# Patient Record
Sex: Female | Born: 1965
Health system: Southern US, Community
[De-identification: ages and names within clinical notes are randomized; demographics above are authoritative.]

## PROBLEM LIST (undated history)

## (undated) DIAGNOSIS — K5190 Ulcerative colitis, unspecified, without complications: Secondary | ICD-10-CM

## (undated) DIAGNOSIS — T7840XA Allergy, unspecified, initial encounter: Secondary | ICD-10-CM

## (undated) DIAGNOSIS — Z803 Family history of malignant neoplasm of breast: Secondary | ICD-10-CM

## (undated) DIAGNOSIS — Z1371 Encounter for nonprocreative screening for genetic disease carrier status: Secondary | ICD-10-CM

## (undated) DIAGNOSIS — F419 Anxiety disorder, unspecified: Secondary | ICD-10-CM

## (undated) HISTORY — DX: Family history of malignant neoplasm of breast: Z80.3

## (undated) HISTORY — DX: Allergy, unspecified, initial encounter: T78.40XA

## (undated) HISTORY — DX: Encounter for nonprocreative screening for genetic disease carrier status: Z13.71

## (undated) HISTORY — DX: Anxiety disorder, unspecified: F41.9

## (undated) HISTORY — PX: TUBAL LIGATION: SHX77

---

## 2004-12-11 ENCOUNTER — Inpatient Hospital Stay: Payer: Self-pay | Admitting: Gastroenterology

## 2005-03-07 ENCOUNTER — Ambulatory Visit: Payer: Self-pay | Admitting: Gastroenterology

## 2008-04-26 ENCOUNTER — Ambulatory Visit: Payer: Self-pay | Admitting: Family

## 2009-10-31 DIAGNOSIS — C4492 Squamous cell carcinoma of skin, unspecified: Secondary | ICD-10-CM

## 2009-10-31 HISTORY — DX: Squamous cell carcinoma of skin, unspecified: C44.92

## 2010-01-17 ENCOUNTER — Ambulatory Visit: Payer: Self-pay | Admitting: Unknown Physician Specialty

## 2010-11-28 ENCOUNTER — Other Ambulatory Visit: Payer: Self-pay

## 2011-10-29 DIAGNOSIS — Z1371 Encounter for nonprocreative screening for genetic disease carrier status: Secondary | ICD-10-CM

## 2011-10-29 HISTORY — DX: Encounter for nonprocreative screening for genetic disease carrier status: Z13.71

## 2011-12-21 ENCOUNTER — Other Ambulatory Visit: Payer: Self-pay | Admitting: Unknown Physician Specialty

## 2011-12-21 LAB — CLOSTRIDIUM DIFFICILE BY PCR

## 2012-03-20 ENCOUNTER — Other Ambulatory Visit: Payer: Self-pay | Admitting: Unknown Physician Specialty

## 2012-03-22 LAB — STOOL CULTURE

## 2013-09-16 ENCOUNTER — Other Ambulatory Visit: Payer: Self-pay

## 2013-10-13 ENCOUNTER — Ambulatory Visit: Payer: Self-pay | Admitting: Urology

## 2015-03-08 ENCOUNTER — Emergency Department (HOSPITAL_COMMUNITY)
Admission: EM | Admit: 2015-03-08 | Discharge: 2015-03-08 | Disposition: A | Discharge status: Home / Self Care | Payer: BLUE CROSS/BLUE SHIELD | Attending: Emergency Medicine | Admitting: Emergency Medicine

## 2015-03-08 ENCOUNTER — Encounter (HOSPITAL_COMMUNITY): Payer: Self-pay | Admitting: Family Medicine

## 2015-03-08 DIAGNOSIS — Y9389 Activity, other specified: Secondary | ICD-10-CM | POA: Insufficient documentation

## 2015-03-08 DIAGNOSIS — Z79899 Other long term (current) drug therapy: Secondary | ICD-10-CM | POA: Diagnosis not present

## 2015-03-08 DIAGNOSIS — F0781 Postconcussional syndrome: Secondary | ICD-10-CM

## 2015-03-08 DIAGNOSIS — S060X0A Concussion without loss of consciousness, initial encounter: Secondary | ICD-10-CM | POA: Diagnosis not present

## 2015-03-08 DIAGNOSIS — Y9289 Other specified places as the place of occurrence of the external cause: Secondary | ICD-10-CM | POA: Diagnosis not present

## 2015-03-08 DIAGNOSIS — Y998 Other external cause status: Secondary | ICD-10-CM | POA: Diagnosis not present

## 2015-03-08 DIAGNOSIS — W01198A Fall on same level from slipping, tripping and stumbling with subsequent striking against other object, initial encounter: Secondary | ICD-10-CM | POA: Diagnosis not present

## 2015-03-08 DIAGNOSIS — S0990XA Unspecified injury of head, initial encounter: Secondary | ICD-10-CM | POA: Diagnosis present

## 2015-03-08 DIAGNOSIS — Z8719 Personal history of other diseases of the digestive system: Secondary | ICD-10-CM | POA: Insufficient documentation

## 2015-03-08 HISTORY — DX: Ulcerative colitis, unspecified, without complications: K51.90

## 2015-03-08 LAB — BASIC METABOLIC PANEL
Anion gap: 8 (ref 5–15)
BUN: 6 mg/dL (ref 6–20)
CHLORIDE: 109 mmol/L (ref 101–111)
CO2: 25 mmol/L (ref 22–32)
Calcium: 9.2 mg/dL (ref 8.9–10.3)
Creatinine, Ser: 0.68 mg/dL (ref 0.44–1.00)
GFR calc Af Amer: >60 mL/min (ref >60)
GFR calc non Af Amer: >60 mL/min (ref >60)
Glucose, Bld: 105 mg/dL — ABNORMAL HIGH (ref 70–99)
Potassium: 4.7 mmol/L (ref 3.5–5.1)
Sodium: 142 mmol/L (ref 135–145)

## 2015-03-08 LAB — URINE MICROSCOPIC-ADD ON

## 2015-03-08 LAB — URINALYSIS, ROUTINE W REFLEX MICROSCOPIC
Bilirubin Urine: NEGATIVE
GLUCOSE, UA: NEGATIVE mg/dL
Ketones, ur: 15 mg/dL — AB
Leukocytes, UA: NEGATIVE
Nitrite: NEGATIVE
PH: 5 (ref 5.0–8.0)
Protein, ur: NEGATIVE mg/dL
SPECIFIC GRAVITY, URINE: 1.016 (ref 1.005–1.030)
Urobilinogen, UA: 0.2 mg/dL (ref 0.0–1.0)

## 2015-03-08 MED ORDER — OXYCODONE HCL 5 MG/5ML PO SOLN: 5.0000 mg | Freq: Once | ORAL | Status: DC

## 2015-03-08 MED ORDER — OXYCODONE HCL 5 MG/5ML PO SOLN
5.0000 mg | Freq: Once | ORAL | Status: AC
  Administered 2015-03-08: 5 mg via ORAL
  Filled 2015-03-08: qty 5

## 2015-03-08 MED ORDER — SODIUM CHLORIDE 0.9 % IV BOLUS (SEPSIS): 1000.0000 mL | Freq: Once | INTRAVENOUS | Status: DC

## 2015-03-08 MED ORDER — ONDANSETRON 4 MG PO TBDP
4.0000 mg | ORAL_TABLET | Freq: Once | ORAL | Status: AC
  Administered 2015-03-08: 4 mg via ORAL
  Filled 2015-03-08: qty 1

## 2015-03-08 MED ORDER — NAPROXEN 375 MG PO TABS: 375.0000 mg | ORAL_TABLET | Freq: Two times a day (BID) | ORAL | Status: DC | PRN

## 2015-03-08 MED ORDER — ONDANSETRON HCL 4 MG PO TABS: 4.0000 mg | ORAL_TABLET | Freq: Three times a day (TID) | ORAL | Status: DC | PRN

## 2015-03-08 NOTE — ED Notes (Signed)
Pt here for fall. 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 sts was seen at hospital yesterday and told she had a concussion. sts neck pain, head hurting and nausea. sts CT scan of head and chest x ray that were normal. [facility].

## 2015-03-08 NOTE — ED Provider Notes (Signed)
  I saw and evaluated the patient, reviewed the resident's note and I agree with the findings and plan.  Pertinent History: concussion yesterday after syncopal event - had prodromal light headedness and tingling int he fingers - X 2 in the gas station parking lot, seen and evlauated with labs and CT at [facility] - reviewed studies - mild hypoK and, protein / ketones in the urine.  Has had ongoing headache, light headedness and has some difficulty swallowing occasionally. Pertinent Exam findings: Has no acute findings.  Neurologic exam:  Speech clear, pupils equal round reactive to light, extraocular movements intact Normal peripheral visual fields Cranial nerves III through XII normal including no facial droop Follows commands, moves all extremities x4, normal strength to bilateral upper and lower extremities at all major muscle groups including grip Sensation normal to light touch and pinprick Coordination intact, no limb ataxia, finger-nose-finger normal Rapid alternating movements normal No pronator drift Gait normal Cardiac exam normal, no arrhythmia, ECG normal, abd soft and no edema.     EKG Interpretation  Date/Time:  Wednesday Mar 08 2015 15:19:29 EDT Ventricular Rate:  61 PR Interval:  113 QRS Duration: 68 QT Interval:  399 QTC Calculation: 402 R Axis:   58 Text Interpretation:  Sinus rhythm Borderline short PR interval ECG OTHERWISE WITHIN NORMAL LIMITS No old tracing to compare Confirmed by Joakim Huesman  MD, Reya Aurich ([id number]) on 03/08/2015 3:48:41 PM      Repeat BMP and UA - hydrate, well appearing otherwise - her OP is clear, she is tolerating secretions and is not aspirating.  Doubt neurological defecits effecting her ability to swallow.  I personally interpreted the EKG as well as the resident and agree with the interpretation on the resident's chart.  Final diagnoses:  Concussion, without loss of consciousness, initial encounter  Post concussion syndrome      Noemi Chapel, MD 03/09/15 2159

## 2015-03-08 NOTE — Discharge Instructions (Signed)
Concussion  A concussion, or closed-head injury, is a brain injury caused by a direct blow to the head or by a quick and sudden movement (jolt) of the head or neck. Concussions are usually not life-threatening. Even so, the effects of a concussion can be serious. If you have had a concussion before, you are more likely to experience concussion-like symptoms after a direct blow to the head.   CAUSES  · Direct blow to the head, such as from running into another player during a soccer game, being hit in a fight, or hitting your head on a hard surface.  · A jolt of the head or neck that causes the brain to move back and forth inside the skull, such as in a car crash.  SIGNS AND SYMPTOMS  The signs of a concussion can be hard to notice. Early on, they may be missed by you, family members, and health care providers. You may look fine but act or feel differently.  Symptoms are usually temporary, but they may last for days, weeks, or even longer. Some symptoms may appear right away while others may not show up for hours or days. Every head injury is different. Symptoms include:  · Mild to moderate headaches that will not go away.  · A feeling of pressure inside your head.  · Having more trouble than usual:  ¨ Learning or remembering things you have heard.  ¨ Answering questions.  ¨ Paying attention or concentrating.  ¨ Organizing daily tasks.  ¨ Making decisions and solving problems.  · Slowness in thinking, acting or reacting, speaking, or reading.  · Getting lost or being easily confused.  · Feeling tired all the time or lacking energy (fatigued).  · Feeling drowsy.  · Sleep disturbances.  ¨ Sleeping more than usual.  ¨ Sleeping less than usual.  ¨ Trouble falling asleep.  ¨ Trouble sleeping (insomnia).  · Loss of balance or feeling lightheaded or dizzy.  · Nausea or vomiting.  · Numbness or tingling.  · Increased sensitivity to:  ¨ Sounds.  ¨ Lights.  ¨ Distractions.  · Vision problems or eyes that tire  easily.  · Diminished sense of taste or smell.  · Ringing in the ears.  · Mood changes such as feeling sad or anxious.  · Becoming easily irritated or angry for little or no reason.  · Lack of motivation.  · Seeing or hearing things other people do not see or hear (hallucinations).  DIAGNOSIS  Your health care provider can usually diagnose a concussion based on a description of your injury and symptoms. He or she will ask whether you passed out (lost consciousness) and whether you are having trouble remembering events that happened right before and during your injury.  Your evaluation might include:  · A brain scan to look for signs of injury to the brain. Even if the test shows no injury, you may still have a concussion.  · Blood tests to be sure other problems are not present.  TREATMENT  · Concussions are usually treated in an emergency department, in urgent care, or at a clinic. You may need to stay in the hospital overnight for further treatment.  · Tell your health care provider if you are taking any medicines, including prescription medicines, over-the-counter medicines, and natural remedies. Some medicines, such as blood thinners (anticoagulants) and aspirin, may increase the chance of complications. Also tell your health care provider whether you have had alcohol or are taking illegal drugs. This information   may affect treatment.  · Your health care provider will send you home with important instructions to follow.  · How fast you will recover from a concussion depends on many factors. These factors include how severe your concussion is, what part of your brain was injured, your age, and how healthy you were before the concussion.  · Most people with mild injuries recover fully. Recovery can take time. In general, recovery is slower in older persons. Also, persons who have had a concussion in the past or have other medical problems may find that it takes longer to recover from their current injury.  HOME  CARE INSTRUCTIONS  General Instructions  · Carefully follow the directions your health care provider gave you.  · Only take over-the-counter or prescription medicines for pain, discomfort, or fever as directed by your health care provider.  · Take only those medicines that your health care provider has approved.  · Do not drink alcohol until your health care provider says you are well enough to do so. Alcohol and certain other drugs may slow your recovery and can put you at risk of further injury.  · If it is harder than usual to remember things, write them down.  · If you are easily distracted, try to do one thing at a time. For example, do not try to watch TV while fixing dinner.  · Talk with family members or close friends when making important decisions.  · Keep all follow-up appointments. Repeated evaluation of your symptoms is recommended for your recovery.  · Watch your symptoms and tell others to do the same. Complications sometimes occur after a concussion. Older adults with a brain injury may have a higher risk of serious complications, such as a blood clot on the brain.  · Tell your teachers, school nurse, school counselor, coach, athletic trainer, or work manager about your injury, symptoms, and restrictions. Tell them about what you can or cannot do. They should watch for:  ¨ Increased problems with attention or concentration.  ¨ Increased difficulty remembering or learning new information.  ¨ Increased time needed to complete tasks or assignments.  ¨ Increased irritability or decreased ability to cope with stress.  ¨ Increased symptoms.  · Rest. Rest helps the brain to heal. Make sure you:  ¨ Get plenty of sleep at night. Avoid staying up late at night.  ¨ Keep the same bedtime hours on weekends and weekdays.  ¨ Rest during the day. Take daytime naps or rest breaks when you feel tired.  · Limit activities that require a lot of thought or concentration. These include:  ¨ Doing homework or job-related  work.  ¨ Watching TV.  ¨ Working on the computer.  · Avoid any situation where there is potential for another head injury (football, hockey, soccer, basketball, martial arts, downhill snow sports and horseback riding). Your condition will get worse every time you experience a concussion. You should avoid these activities until you are evaluated by the appropriate follow-up health care providers.  Returning To Your Regular Activities  You will need to return to your normal activities slowly, not all at once. You must give your body and brain enough time for recovery.  · Do not return to sports or other athletic activities until your health care provider tells you it is safe to do so.  · Ask your health care provider when you can drive, ride a bicycle, or operate heavy machinery. Your ability to react may be slower after a   brain injury. Never do these activities if you are dizzy.  · Ask your health care provider about when you can return to work or school.  Preventing Another Concussion  It is very important to avoid another brain injury, especially before you have recovered. In rare cases, another injury can lead to permanent brain damage, brain swelling, or death. The risk of this is greatest during the first 7-10 days after a head injury. Avoid injuries by:  · Wearing a seat belt when riding in a car.  · Drinking alcohol only in moderation.  · Wearing a helmet when biking, skiing, skateboarding, skating, or doing similar activities.  · Avoiding activities that could lead to a second concussion, such as contact or recreational sports, until your health care provider says it is okay.  · Taking safety measures in your home.  ¨ Remove clutter and tripping hazards from floors and stairways.  ¨ Use grab bars in bathrooms and handrails by stairs.  ¨ Place non-slip mats on floors and in bathtubs.  ¨ Improve lighting in dim areas.  SEEK MEDICAL CARE IF:  · You have increased problems paying attention or  concentrating.  · You have increased difficulty remembering or learning new information.  · You need more time to complete tasks or assignments than before.  · You have increased irritability or decreased ability to cope with stress.  · You have more symptoms than before.  Seek medical care if you have any of the following symptoms for more than 2 weeks after your injury:  · Lasting (chronic) headaches.  · Dizziness or balance problems.  · Nausea.  · Vision problems.  · Increased sensitivity to noise or light.  · Depression or mood swings.  · Anxiety or irritability.  · Memory problems.  · Difficulty concentrating or paying attention.  · Sleep problems.  · Feeling tired all the time.  SEEK IMMEDIATE MEDICAL CARE IF:  · You have severe or worsening headaches. These may be a sign of a blood clot in the brain.  · You have weakness (even if only in one hand, leg, or part of the face).  · You have numbness.  · You have decreased coordination.  · You vomit repeatedly.  · You have increased sleepiness.  · One pupil is larger than the other.  · You have convulsions.  · You have slurred speech.  · You have increased confusion. This may be a sign of a blood clot in the brain.  · You have increased restlessness, agitation, or irritability.  · You are unable to recognize people or places.  · You have neck pain.  · It is difficult to wake you up.  · You have unusual behavior changes.  · You lose consciousness.  MAKE SURE YOU:  · Understand these instructions.  · Will watch your condition.  · Will get help right away if you are not doing well or get worse.  Document Released: 01/04/2004 Document Revised: 10/19/2013 Document Reviewed: 05/06/2013  Exit[employer]® Patient Information ©2015 [employer]. This information is not intended to replace advice given to you by your health care provider. Make sure you discuss any questions you have with your health care provider.

## 2015-03-08 NOTE — ED Provider Notes (Signed)
CSN: [id number]     Arrival date & time 03/08/15  1247 History   First MD Initiated Contact with Patient 03/08/15 1517     Chief Complaint  Patient presents with  . Fall     (Consider location/radiation/quality/duration/timing/severity/associated sxs/prior Treatment) HPI Comments: 49 year old female status Jessey Huyett fall from standing yesterday. Patient reports getting out of her car feeling lightheaded with tingling in her fingers. She subsequently had a syncopal episode striking the back of her head on the concrete. She was taken outside hospital had full workup including blood work head CT etc. which was normal. She is discharged home. Today she continues to have headaches some nausea but no emesis. However she is not told she had a concussion. When she continued to have the symptoms today she returned the hospital for further evaluation. Also describing some painful swelling today however that is waxing and waning. She is tolerating liquid and solid. Unclear if this is related fall yesterday.  Patient is a 49 y.o. female presenting with fall. The history is provided by the patient.  Fall This is a new problem. The problem occurs rarely. The problem has been resolved. Associated symptoms include headaches. Pertinent negatives include no abdominal pain, chest pain, congestion, fever or rash. Nothing aggravates the symptoms.    Past Medical History  Diagnosis Date  . UC (ulcerative colitis)    Past Surgical History  Procedure Laterality Date  . Cesarean section     History reviewed. No pertinent family history. History  Substance Use Topics  . Smoking status: Never Smoker   . Smokeless tobacco: Not on file  . Alcohol Use: No   OB History    No data available     Review of Systems  Constitutional: Negative for fever.  HENT: Negative for congestion.   Respiratory: Negative for shortness of breath.   Cardiovascular: Negative for chest pain.  Gastrointestinal: Negative for abdominal  pain.  Genitourinary: Negative for dysuria.  Skin: Negative for rash.  Neurological: Positive for headaches. Negative for syncope and light-headedness.  Psychiatric/Behavioral: Negative for confusion.  All other systems reviewed and are negative.     Allergies  Review of patient's allergies indicates no known allergies.  Home Medications   Prior to Admission medications   Medication Sig Start Date End Date Taking? Authorizing Provider  mercaptopurine (PURINETHOL) 50 MG tablet Take 50 mg by mouth daily. 01/10/15  Yes Historical Provider, MD  sertraline (ZOLOFT) 50 MG tablet Take 75 mg by mouth daily as needed. 07/12/14  Yes Historical Provider, MD  naproxen (NAPROSYN) 375 MG tablet Take 1 tablet (375 mg total) by mouth 2 (two) times daily as needed for moderate pain. 03/08/15   Robynn Pane, MD  oxyCODONE (ROXICODONE) 5 MG/5ML solution Take 5 mLs (5 mg total) by mouth once. 03/08/15   Robynn Pane, MD   BP 123/74 mmHg  Pulse 65  Temp(Src) 98.6 F (37 C)  Resp 22  Ht 5' 4"  (1.626 m)  Wt 120 lb (54.432 kg)  BMI 20.59 kg/m2  SpO2 100%  LMP 03/03/2015 Physical Exam  Constitutional: She is oriented to person, place, and time. She appears well-developed.  HENT:  Head: Normocephalic.  No large abrasions hematomas etc. No step-offs.  Eyes: Pupils are equal, round, and reactive to light.  Neck: Normal range of motion.  Cardiovascular: Normal rate.   No murmur heard. Pulmonary/Chest: No respiratory distress.  Abdominal: Soft. She exhibits no distension. There is no tenderness.  Musculoskeletal: Normal range of motion. She exhibits no  tenderness.  Neurological: She is alert and oriented to person, place, and time. No cranial nerve deficit. She exhibits normal muscle tone. Coordination normal.  Skin: Skin is warm. No rash noted.  Psychiatric: She has a normal mood and affect.  Vitals reviewed.   ED Course  Procedures (including critical care time) Labs Review Labs Reviewed  BASIC  METABOLIC PANEL - Abnormal; Notable for the following:    Glucose, Bld 105 (*)    All other components within normal limits  URINALYSIS, ROUTINE W REFLEX MICROSCOPIC - Abnormal; Notable for the following:    APPearance CLOUDY (*)    Hgb urine dipstick MODERATE (*)    Ketones, ur 15 (*)    All other components within normal limits  URINE MICROSCOPIC-ADD ON - Abnormal; Notable for the following:    Squamous Epithelial / LPF FEW (*)    Bacteria, UA FEW (*)    All other components within normal limits    Imaging Review No results found.   EKG Interpretation   Date/Time:  Wednesday Mar 08 2015 15:19:29 EDT Ventricular Rate:  61 PR Interval:  113 QRS Duration: 68 QT Interval:  399 QTC Calculation: 402 R Axis:   58 Text Interpretation:  Sinus rhythm Borderline short PR interval ECG  OTHERWISE WITHIN NORMAL LIMITS No old tracing to compare Confirmed by  MILLER  MD, [place] ([id number]) on 03/08/2015 3:48:41 PM      MDM  49 year old female fall 24 hours ago. Was evaluated at outside hospital with labs, CT which were normal. Patient arrives today with complaints of slight nausea and headache. Signs and symptoms c/w postconcussive syndrome. On physical exam patient has a completely normal neurological exam without focal deficits. CT read reviewed from outside hospital-normal. Labs from yesterday were also obtained and reviewed including cbc, bmp, ua, uds etc (workup for syncope). Based on patient's story and labs it appears most likely vasovagal in nature. No organic cause for syncope on OSH labs. On BMP yesterday she was noted to have a slightly elevated anion gap with UA which showed some mild ketones, therefore these labs were repeated today. Patient anion gap now normal. UA unremarkable. Patient is likely with postconcussive syndrome. We will recommend ibuprofen and naproxen. Also per 5 small prescription for Roxicodone. We counseled patient on expected duration and type of symptoms with  postconcussive ache. Discussed precautions. Believe appropriate for outpatient management. Patient is also complaining of some painful swallowing today. Patient has no stridor no swelling or deformities/abnormalities to the neck. Oropharynx is clear without swelling. Voice normal. Tolerating by mouth easily in the ED. Recommend follow-up with PCP for this if this continues to be an issue.  Final diagnoses:  Concussion, without loss of consciousness, initial encounter  Mcdaniel Ohms concussion syndrome        Robynn Pane, MD 03/08/15 2210  Noemi Chapel, MD 03/09/15 2159

## 2015-03-08 NOTE — ED Notes (Signed)
Gave pt water

## 2016-03-13 DIAGNOSIS — R41840 Attention and concentration deficit: Secondary | ICD-10-CM | POA: Diagnosis not present

## 2016-03-13 DIAGNOSIS — G44229 Chronic tension-type headache, not intractable: Secondary | ICD-10-CM | POA: Diagnosis not present

## 2016-07-02 DIAGNOSIS — L720 Epidermal cyst: Secondary | ICD-10-CM | POA: Diagnosis not present

## 2016-07-02 DIAGNOSIS — L820 Inflamed seborrheic keratosis: Secondary | ICD-10-CM | POA: Diagnosis not present

## 2016-07-17 ENCOUNTER — Other Ambulatory Visit: Payer: Self-pay | Admitting: Obstetrics and Gynecology

## 2016-07-17 DIAGNOSIS — Z1231 Encounter for screening mammogram for malignant neoplasm of breast: Secondary | ICD-10-CM

## 2016-08-07 DIAGNOSIS — Z315 Encounter for genetic counseling: Secondary | ICD-10-CM | POA: Diagnosis not present

## 2016-08-07 DIAGNOSIS — Z1239 Encounter for other screening for malignant neoplasm of breast: Secondary | ICD-10-CM | POA: Diagnosis not present

## 2016-08-07 DIAGNOSIS — Z803 Family history of malignant neoplasm of breast: Secondary | ICD-10-CM | POA: Diagnosis not present

## 2016-08-07 DIAGNOSIS — Z01419 Encounter for gynecological examination (general) (routine) without abnormal findings: Secondary | ICD-10-CM | POA: Diagnosis not present

## 2016-08-07 DIAGNOSIS — Z124 Encounter for screening for malignant neoplasm of cervix: Secondary | ICD-10-CM | POA: Diagnosis not present

## 2016-08-12 ENCOUNTER — Ambulatory Visit
Admission: RE | Admit: 2016-08-12 | Discharge: 2016-08-12 | Disposition: A | Discharge status: Home / Self Care | Payer: BLUE CROSS/BLUE SHIELD | Source: Ambulatory Visit | Attending: Obstetrics and Gynecology | Admitting: Obstetrics and Gynecology

## 2016-08-12 DIAGNOSIS — Z315 Encounter for genetic counseling: Secondary | ICD-10-CM | POA: Diagnosis not present

## 2016-08-12 DIAGNOSIS — Z1239 Encounter for other screening for malignant neoplasm of breast: Secondary | ICD-10-CM | POA: Diagnosis not present

## 2016-08-12 DIAGNOSIS — Z01419 Encounter for gynecological examination (general) (routine) without abnormal findings: Secondary | ICD-10-CM | POA: Diagnosis not present

## 2016-08-12 DIAGNOSIS — Z1231 Encounter for screening mammogram for malignant neoplasm of breast: Secondary | ICD-10-CM | POA: Insufficient documentation

## 2016-08-12 DIAGNOSIS — Z124 Encounter for screening for malignant neoplasm of cervix: Secondary | ICD-10-CM | POA: Diagnosis not present

## 2016-08-14 ENCOUNTER — Other Ambulatory Visit: Payer: Self-pay | Admitting: *Deleted

## 2016-08-14 ENCOUNTER — Ambulatory Visit
Admission: RE | Admit: 2016-08-14 | Discharge: 2016-08-14 | Disposition: A | Discharge status: Home / Self Care | Payer: Self-pay | Source: Ambulatory Visit | Attending: *Deleted | Admitting: *Deleted

## 2016-08-14 DIAGNOSIS — G44229 Chronic tension-type headache, not intractable: Secondary | ICD-10-CM | POA: Diagnosis not present

## 2016-08-14 DIAGNOSIS — F331 Major depressive disorder, recurrent, moderate: Secondary | ICD-10-CM | POA: Diagnosis not present

## 2016-08-14 DIAGNOSIS — Z9289 Personal history of other medical treatment: Secondary | ICD-10-CM

## 2016-08-14 DIAGNOSIS — R41840 Attention and concentration deficit: Secondary | ICD-10-CM | POA: Diagnosis not present

## 2016-08-14 LAB — HM PAP SMEAR: HM Pap smear: NEGATIVE

## 2017-06-10 ENCOUNTER — Telehealth: Payer: Self-pay

## 2017-06-10 NOTE — Telephone Encounter (Signed)
Pt needs appt with [place] or PCP for eval/mgmt. Not standard sx of menopause. RN to notify pt.

## 2017-06-10 NOTE — Telephone Encounter (Signed)
Pt is going thru menopause, wants rx for anxiety.  [facility] [address].  [phone]

## 2017-06-11 NOTE — Telephone Encounter (Signed)
Left detailed msg.

## 2017-09-03 ENCOUNTER — Other Ambulatory Visit: Payer: Self-pay | Admitting: Obstetrics and Gynecology

## 2017-09-03 DIAGNOSIS — Z1231 Encounter for screening mammogram for malignant neoplasm of breast: Secondary | ICD-10-CM

## 2017-09-23 ENCOUNTER — Encounter: Payer: Self-pay | Admitting: Obstetrics and Gynecology

## 2017-09-23 ENCOUNTER — Ambulatory Visit
Admission: RE | Admit: 2017-09-23 | Discharge: 2017-09-23 | Disposition: A | Discharge status: Home / Self Care | Payer: BLUE CROSS/BLUE SHIELD | Source: Ambulatory Visit | Attending: Obstetrics and Gynecology | Admitting: Obstetrics and Gynecology

## 2017-09-23 DIAGNOSIS — Z1231 Encounter for screening mammogram for malignant neoplasm of breast: Secondary | ICD-10-CM | POA: Diagnosis not present

## 2017-09-30 ENCOUNTER — Ambulatory Visit (INDEPENDENT_AMBULATORY_CARE_PROVIDER_SITE_OTHER): Payer: BLUE CROSS/BLUE SHIELD | Admitting: Obstetrics and Gynecology

## 2017-09-30 ENCOUNTER — Encounter: Payer: Self-pay | Admitting: Obstetrics and Gynecology

## 2017-09-30 VITALS — BP 120/70 | HR 66 | Ht 64.0 in | Wt 124.0 lb

## 2017-09-30 DIAGNOSIS — Z01419 Encounter for gynecological examination (general) (routine) without abnormal findings: Secondary | ICD-10-CM

## 2017-09-30 DIAGNOSIS — Z13220 Encounter for screening for lipoid disorders: Secondary | ICD-10-CM | POA: Diagnosis not present

## 2017-09-30 DIAGNOSIS — Z0000 Encounter for general adult medical examination without abnormal findings: Secondary | ICD-10-CM

## 2017-09-30 DIAGNOSIS — Z1239 Encounter for other screening for malignant neoplasm of breast: Secondary | ICD-10-CM

## 2017-09-30 DIAGNOSIS — Z803 Family history of malignant neoplasm of breast: Secondary | ICD-10-CM | POA: Diagnosis not present

## 2017-09-30 DIAGNOSIS — Z1231 Encounter for screening mammogram for malignant neoplasm of breast: Secondary | ICD-10-CM | POA: Diagnosis not present

## 2017-09-30 NOTE — Progress Notes (Signed)
PCP: Chad Cordial, PA-C   Chief Complaint  Patient presents with  . Gynecologic Exam    HPI:      Ms. Natasha Mueller is a 51 y.o. [phone] who LMP was Patient's last menstrual period was 06/14/2016 (exact date)., presents today for her annual examination.  Her menses are absent due to menopause. She does not have intermenstrual bleeding.  She does not have vasomotor sx.  Sex activity: not sexually active. She does not have vaginal dryness.  Last Pap: August 07, 2016  Results were: no abnormalities /neg HPV DNA 2016. Did yearly paps due to UC treatment, but no treatment in about 4 yrs. Hx of STDs: none  Last mammogram: September 23, 2017  Results were: normal--routine follow-up in 12 months There is a FH of breast cancer in her mom. Pt is BRCA/BART neg 2013. IBIS=15%. Pt has declined update testing in the past. There is no FH of ovarian cancer. The patient does not do self-breast exams.  Colonoscopy: colonoscopy 3 years ago without abnormalities. . Repeat due after 4 years due to UC.   Tobacco use: The patient denies current or previous tobacco use. Alcohol use: none Exercise: not active  She does not get adequate calcium and Vitamin D in her diet.  Had borderline lipids last yr. Due for lab rechk.  Past Medical History:  Diagnosis Date  . BRCA negative 2013   with BART  . Family history of breast cancer    BRCA/Bart neg 2013; IBIS=15%  . UC (ulcerative colitis) [facility])     Past Surgical History:  Procedure Laterality Date  . CESAREAN SECTION      Family History  Problem Relation Age of Onset  . Breast cancer Mother 59  . Hypertension Mother   . Atrial fibrillation Father   . Diabetes Sister   . Atrial fibrillation Brother     Social History   Socioeconomic History  . Marital status: Divorced    Spouse name: Not on file  . Number of children: Not on file  . Years of education: Not on file  . Highest education level: Not on file  Social Needs  .  Financial resource strain: Not on file  . Food insecurity - worry: Not on file  . Food insecurity - inability: Not on file  . Transportation needs - medical: Not on file  . Transportation needs - non-medical: Not on file  Occupational History  . Not on file  Tobacco Use  . Smoking status: Never Smoker  . Smokeless tobacco: Never Used  Substance and Sexual Activity  . Alcohol use: No  . Drug use: No  . Sexual activity: No    Birth control/protection: Post-menopausal  Other Topics Concern  . Not on file  Social History Narrative  . Not on file    No outpatient medications have been marked as taking for the 09/30/17 encounter (Office Visit) with Alliyah Roesler, Deirdre Evener, PA-C.      ROS:  Review of Systems  Constitutional: Negative for fatigue, fever and unexpected weight change.  Respiratory: Negative for cough, shortness of breath and wheezing.   Cardiovascular: Negative for chest pain, palpitations and leg swelling.  Gastrointestinal: Negative for blood in stool, constipation, diarrhea, nausea and vomiting.  Endocrine: Negative for cold intolerance, heat intolerance and polyuria.  Genitourinary: Negative for dyspareunia, dysuria, flank pain, frequency, genital sores, hematuria, menstrual problem, pelvic pain, urgency, vaginal bleeding, vaginal discharge and vaginal pain.  Musculoskeletal: Negative for back pain, joint swelling and  myalgias.  Skin: Negative for rash.  Neurological: Negative for dizziness, syncope, light-headedness, numbness and headaches.  Hematological: Negative for adenopathy.  Psychiatric/Behavioral: Negative for agitation, confusion, sleep disturbance and suicidal ideas. The patient is not nervous/anxious.      Objective: BP 120/70   Pulse 66   Ht 5' 4" (1.626 m)   Wt 124 lb (56.2 kg)   LMP 06/14/2016 (Exact Date)   BMI 21.28 kg/m    Physical Exam  Constitutional: She is oriented to person, place, and time. She appears well-developed and  well-nourished.  Genitourinary: Vagina normal and uterus normal. There is no rash or tenderness on the right labia. There is no rash or tenderness on the left labia. No erythema or tenderness in the vagina. No vaginal discharge found. Right adnexum does not display mass and does not display tenderness. Left adnexum does not display mass and does not display tenderness. Cervix does not exhibit motion tenderness or polyp. Uterus is not enlarged or tender.  Neck: Normal range of motion. No thyromegaly present.  Cardiovascular: Normal rate, regular rhythm and normal heart sounds.  No murmur heard. Pulmonary/Chest: Effort normal and breath sounds normal. Right breast exhibits no mass, no nipple discharge, no skin change and no tenderness. Left breast exhibits no mass, no nipple discharge, no skin change and no tenderness.  Abdominal: Soft. There is no tenderness. There is no guarding.  Musculoskeletal: Normal range of motion.  Neurological: She is alert and oriented to person, place, and time. No cranial nerve deficit.  Psychiatric: She has a normal mood and affect. Her behavior is normal.  Vitals reviewed.   Assessment/Plan:  Encounter for annual routine gynecological examination  Screening for breast cancer - Pt current on mammo. Due 11/19.  Blood tests for routine general physical examination - Pt to do at work. Will f/u with results.  - Plan: Comprehensive metabolic panel, Lipid panel  Screening cholesterol level - Plan: Lipid panel  Family history of breast cancer - MyRisk update testing discussed and handout given. Pt to f/u if desires.      GYN counsel breast self exam, mammography screening, adequate intake of calcium and vitamin D, diet and exercise    F/U  Return in about 1 year (around 09/30/2018).   B. , PA-C 09/30/2017 4:40 PM

## 2017-09-30 NOTE — Patient Instructions (Signed)
I value your feedback and entrusting us with your care. If you get a [facility] patient survey, I would appreciate you taking the time to let us know about your experience today. Thank you! 

## 2018-04-13 DIAGNOSIS — K5190 Ulcerative colitis, unspecified, without complications: Secondary | ICD-10-CM | POA: Diagnosis not present

## 2018-05-14 DIAGNOSIS — B078 Other viral warts: Secondary | ICD-10-CM | POA: Diagnosis not present

## 2018-05-14 DIAGNOSIS — L820 Inflamed seborrheic keratosis: Secondary | ICD-10-CM | POA: Diagnosis not present

## 2018-05-14 DIAGNOSIS — L720 Epidermal cyst: Secondary | ICD-10-CM | POA: Diagnosis not present

## 2018-06-12 DIAGNOSIS — L820 Inflamed seborrheic keratosis: Secondary | ICD-10-CM | POA: Diagnosis not present

## 2018-06-12 DIAGNOSIS — B078 Other viral warts: Secondary | ICD-10-CM | POA: Diagnosis not present

## 2018-07-14 DIAGNOSIS — B078 Other viral warts: Secondary | ICD-10-CM | POA: Diagnosis not present

## 2018-07-14 DIAGNOSIS — L820 Inflamed seborrheic keratosis: Secondary | ICD-10-CM | POA: Diagnosis not present

## 2018-10-01 ENCOUNTER — Ambulatory Visit: Payer: Self-pay | Admitting: Physician Assistant

## 2018-10-19 ENCOUNTER — Encounter: Payer: Self-pay | Admitting: Physician Assistant

## 2018-10-19 ENCOUNTER — Ambulatory Visit (INDEPENDENT_AMBULATORY_CARE_PROVIDER_SITE_OTHER): Payer: BLUE CROSS/BLUE SHIELD | Admitting: Physician Assistant

## 2018-10-19 VITALS — BP 127/78 | HR 68 | Temp 98.6°F | Ht 64.0 in | Wt 133.0 lb

## 2018-10-19 DIAGNOSIS — Z23 Encounter for immunization: Secondary | ICD-10-CM | POA: Diagnosis not present

## 2018-10-19 DIAGNOSIS — Z1159 Encounter for screening for other viral diseases: Secondary | ICD-10-CM

## 2018-10-19 DIAGNOSIS — Z0000 Encounter for general adult medical examination without abnormal findings: Secondary | ICD-10-CM | POA: Diagnosis not present

## 2018-10-19 DIAGNOSIS — K5190 Ulcerative colitis, unspecified, without complications: Secondary | ICD-10-CM

## 2018-10-19 DIAGNOSIS — Z131 Encounter for screening for diabetes mellitus: Secondary | ICD-10-CM

## 2018-10-19 DIAGNOSIS — R252 Cramp and spasm: Secondary | ICD-10-CM | POA: Diagnosis not present

## 2018-10-19 DIAGNOSIS — G4709 Other insomnia: Secondary | ICD-10-CM

## 2018-10-19 DIAGNOSIS — Z13220 Encounter for screening for lipoid disorders: Secondary | ICD-10-CM | POA: Diagnosis not present

## 2018-10-19 DIAGNOSIS — Z114 Encounter for screening for human immunodeficiency virus [HIV]: Secondary | ICD-10-CM

## 2018-10-19 DIAGNOSIS — Z1329 Encounter for screening for other suspected endocrine disorder: Secondary | ICD-10-CM | POA: Diagnosis not present

## 2018-10-19 DIAGNOSIS — Z130 Encounter for screening for diseases of the blood and blood-forming organs and certain disorders involving the immune mechanism: Secondary | ICD-10-CM

## 2018-10-19 MED ORDER — TRAZODONE HCL 50 MG PO TABS: 25.0000 mg | ORAL_TABLET | Freq: Every evening | ORAL | 0 refills | Status: DC | PRN

## 2018-10-19 NOTE — Patient Instructions (Signed)

## 2018-10-19 NOTE — Progress Notes (Signed)
Patient: Natasha Mueller, Female    DOB: 1966-09-14, 52 y.o.   MRN: [id number] Visit Date: 10/19/2018  Today's Provider: Trinna Post, PA-C   Chief Complaint  Patient presents with  . New Patient (Initial Visit)   Subjective:    New Patient Appointment Natasha Mueller is a 52 y.o. female who presents today for new patient appointment. She feels well. She reports exercising none. She reports she is sleeping fairly well. She is living in [place]. Has a fiance of two years, plans on getting married in 2020.   Has three children: daughter aged 40 who is also patient at this clinic, son 32, son 89 - no health issues. 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   History of abnormal pap smear : history of HPV but no procedures. She is currently followed by Ardeth Perfect, PA-C at [facility]. She reports she had a PAP smear recently. She believes it was normal. History of breast cancer in mom at age 62 and sister at age 50. She has been tested for BRCA and she was negative per her report.  She has a history of ulcerative colitis, diagnosed 12 years ago. Many years ago she was on asacol but this was ineffective for her. She was most recently placed on 62m in June due to flare of symptoms. She has since stopped taking this because she feels better.  She reports she has issues sleeping. She reports she does not have issues falling asleep, but rather staying asleep. Will wake up at 3-4 sometimes and will be unable to get back to bed. Goes to bed at 9-10. Has tried her fiance's aLorrin Maiswhich has been helpful. She denies any history of sleep apnea, witness snoring or apneic episodes. She does have some intermittent leg cramping. Denies urge to move legs at night. ----------------------------------------------------------------- Last mammogram: 09/23/2017 Last Pap: 09/30/2017  Review of Systems  Constitutional: Negative.   HENT: Positive for postnasal drip.   Eyes: Negative.     Respiratory: Negative.   Cardiovascular: Negative.   Gastrointestinal: Negative.   Endocrine: Negative.   Genitourinary: Negative.   Musculoskeletal: Positive for arthralgias.  Skin: Negative.   Allergic/Immunologic: Positive for environmental allergies.  Neurological: Negative.   Hematological: Negative.   Psychiatric/Behavioral: Negative.     Social History She  reports that she has never smoked. She has never used smokeless tobacco. She reports that she does not drink alcohol or use drugs. Social History   Socioeconomic History  . Marital status: Divorced    Spouse name: Not on file  . Number of children: Not on file  . Years of education: Not on file  . Highest education level: Not on file  Occupational History  . Not on file  Social Needs  . Financial resource strain: Not on file  . Food insecurity:    Worry: Not on file    Inability: Not on file  . Transportation needs:    Medical: Not on file    Non-medical: Not on file  Tobacco Use  . Smoking status: Never Smoker  . Smokeless tobacco: Never Used  Substance and Sexual Activity  . Alcohol use: No  . Drug use: No  . Sexual activity: Never    Birth control/protection: Post-menopausal  Lifestyle  . Physical activity:    Days per week: Not on file    Minutes per session: Not on file  . Stress: Not on file  Relationships  . Social  connections:    Talks on phone: Not on file    Gets together: Not on file    Attends religious service: Not on file    Active member of club or organization: Not on file    Attends meetings of clubs or organizations: Not on file    Relationship status: Not on file  Other Topics Concern  . Not on file  Social History Narrative  . Not on file    There are no active problems to display for this patient.   Past Surgical History:  Procedure Laterality Date  . CESAREAN SECTION      Family History  Family Status  Relation Name Status  . Mother  (Not Specified)  . Father   (Not Specified)  . Sister  (Not Specified)  . Brother  (Not Specified)   Her family history includes Atrial fibrillation in her brother and father; Breast cancer (age of onset: 61) in her mother; Diabetes in her sister; Hypertension in her mother.     Allergies  Allergen Reactions  . Hyoscyamine Sulfate Swelling    Tongue swelling    Previous Medications   No medications on file    Patient Care Team: Paulene Floor as PCP - General (Physician Assistant)      Objective:   Vitals: BP 127/78 (BP Location: Left Arm, Patient Position: Sitting, Cuff Size: Normal)   Pulse 68   Temp 98.6 F (37 C) (Oral)   Ht _0  (1.626 m)   Wt 133 lb (60.3 kg)   LMP 06/14/2016 (Exact Date)   SpO2 99%   BMI 22.83 kg/m    Physical Exam Constitutional:      Appearance: Normal appearance.  HENT:     Right Ear: Tympanic membrane and ear canal normal.     Left Ear: Tympanic membrane and ear canal normal.     Mouth/Throat:     Mouth: Mucous membranes are moist.     Pharynx: Oropharynx is clear.  Eyes:     Conjunctiva/sclera: Conjunctivae normal.  Cardiovascular:     Rate and Rhythm: Normal rate and regular rhythm.     Heart sounds: Normal heart sounds.  Pulmonary:     Effort: Pulmonary effort is normal.     Breath sounds: Normal breath sounds.  Abdominal:     General: Abdomen is flat. Bowel sounds are normal.     Palpations: Abdomen is soft.  Neurological:     General: No focal deficit present.     Mental Status: She is alert and oriented to person, place, and time.  Psychiatric:        Mood and Affect: Mood normal.        Behavior: Behavior normal.      Depression Screen PHQ 2/9 Scores 10/19/2018  PHQ - 2 Score 0  PHQ- 9 Score 3      Assessment & Plan:     Routine Health Maintenance and Physical Exam  Exercise Activities and Dietary recommendations Goals   None      There is no immunization history on file for this patient.  Health Maintenance  Topic  Date Due  . Hepatitis C Screening  12-Dec-1965  . HIV Screening  11/23/1978  . TETANUS/TDAP  11/23/1982  . COLONOSCOPY  11/23/2013  . INFLUENZA VACCINE  05/28/2018  . PAP SMEAR-Modifier  08/08/2019  . MAMMOGRAM  09/24/2019     Discussed health benefits of physical activity, and encouraged her to engage in regular exercise appropriate for her  age and condition.    1. Annual physical exam   2. Other insomnia  Start trazodone as below.  - traZODone (DESYREL) 50 MG tablet; Take 0.5-1 tablets (25-50 mg total) by mouth at bedtime as needed for sleep.  Dispense: 30 tablet; Refill: 0  3. Ulcerative colitis without complications, unspecified location [facility])  Should follow up per GI.  - Fe+TIBC+Fer  4. Leg cramping  - Magnesium - Fe+TIBC+Fer  5. Encounter for hepatitis C screening test for low risk patient  - Hepatitis C antibody  6. Encounter for screening for HIV  - HIV antibody (with reflex)  7. Screening cholesterol level  - Lipid Profile  8. Diabetes mellitus screening  - Comprehensive Metabolic Panel (CMET)  9. Thyroid disorder screen  - TSH  10. Screening for deficiency anemia  - CBC with Differential  11. Need for Tdap vaccination  - Tdap vaccine greater than or equal to 7yo IM  Return in about 3 months (around 01/18/2019) for insomnia.  The entirety of the information documented in the History of Present Illness, Review of Systems and Physical Exam were personally obtained by me. Portions of this information were initially documented by Lyndel Pleasure, CMA and reviewed by me for thoroughness and accuracy.      --------------------------------------------------------------------

## 2018-10-20 LAB — COMPREHENSIVE METABOLIC PANEL
ALT: 15 IU/L (ref 0–32)
AST: 16 IU/L (ref 0–40)
Albumin/Globulin Ratio: 1.8 (ref 1.2–2.2)
Albumin: 4.8 g/dL (ref 3.5–5.5)
Alkaline Phosphatase: 73 IU/L (ref 39–117)
BUN/Creatinine Ratio: 14 (ref 9–23)
BUN: 11 mg/dL (ref 6–24)
Bilirubin Total: <0.2 mg/dL (ref 0.0–1.2)
CO2: 22 mmol/L (ref 20–29)
Calcium: 9.8 mg/dL (ref 8.7–10.2)
Chloride: 102 mmol/L (ref 96–106)
Creatinine, Ser: 0.8 mg/dL (ref 0.57–1.00)
GFR calc Af Amer: 97 mL/min/{1.73_m2} (ref >59)
GFR calc non Af Amer: 84 mL/min/{1.73_m2} (ref >59)
Globulin, Total: 2.7 g/dL (ref 1.5–4.5)
Glucose: 85 mg/dL (ref 65–99)
Potassium: 4 mmol/L (ref 3.5–5.2)
Sodium: 139 mmol/L (ref 134–144)
Total Protein: 7.5 g/dL (ref 6.0–8.5)

## 2018-10-20 LAB — CBC WITH DIFFERENTIAL/PLATELET
Basophils Absolute: 0.1 10*3/uL (ref 0.0–0.2)
Basos: 2 %
EOS (ABSOLUTE): 0.5 10*3/uL — ABNORMAL HIGH (ref 0.0–0.4)
Eos: 8 %
Hematocrit: 37 % (ref 34.0–46.6)
Hemoglobin: 12.8 g/dL (ref 11.1–15.9)
Immature Grans (Abs): 0 10*3/uL (ref 0.0–0.1)
Immature Granulocytes: 0 %
Lymphocytes Absolute: 1.8 10*3/uL (ref 0.7–3.1)
Lymphs: 28 %
MCH: 30.7 pg (ref 26.6–33.0)
MCHC: 34.6 g/dL (ref 31.5–35.7)
MCV: 89 fL (ref 79–97)
Monocytes Absolute: 0.5 10*3/uL (ref 0.1–0.9)
Monocytes: 8 %
Neutrophils Absolute: 3.6 10*3/uL (ref 1.4–7.0)
Neutrophils: 54 %
Platelets: 306 10*3/uL (ref 150–450)
RBC: 4.17 x10E6/uL (ref 3.77–5.28)
RDW: 13 % (ref 12.3–15.4)
WBC: 6.5 10*3/uL (ref 3.4–10.8)

## 2018-10-20 LAB — LIPID PANEL
Chol/HDL Ratio: 3.2 ratio (ref 0.0–4.4)
Cholesterol, Total: 246 mg/dL — ABNORMAL HIGH (ref 100–199)
HDL: 77 mg/dL (ref >39)
LDL Calculated: 142 mg/dL — ABNORMAL HIGH (ref 0–99)
Triglycerides: 137 mg/dL (ref 0–149)
VLDL Cholesterol Cal: 27 mg/dL (ref 5–40)

## 2018-10-20 LAB — IRON,TIBC AND FERRITIN PANEL
Ferritin: 57 ng/mL (ref 15–150)
Iron Saturation: 31 % (ref 15–55)
Iron: 97 ug/dL (ref 27–159)
Total Iron Binding Capacity: 317 ug/dL (ref 250–450)
UIBC: 220 ug/dL (ref 131–425)

## 2018-10-20 LAB — MAGNESIUM: Magnesium: 2.3 mg/dL (ref 1.6–2.3)

## 2018-10-20 LAB — HEPATITIS C ANTIBODY: Hep C Virus Ab: <0.1 s/co ratio (ref 0.0–0.9)

## 2018-10-20 LAB — HIV ANTIBODY (ROUTINE TESTING W REFLEX): HIV Screen 4th Generation wRfx: NONREACTIVE

## 2018-10-20 LAB — TSH: TSH: 0.699 u[IU]/mL (ref 0.450–4.500)

## 2018-10-26 ENCOUNTER — Other Ambulatory Visit: Payer: Self-pay | Admitting: Physician Assistant

## 2018-10-26 DIAGNOSIS — Z1231 Encounter for screening mammogram for malignant neoplasm of breast: Secondary | ICD-10-CM

## 2018-11-18 ENCOUNTER — Ambulatory Visit
Admission: RE | Admit: 2018-11-18 | Discharge: 2018-11-18 | Disposition: A | Discharge status: Home / Self Care | Payer: BLUE CROSS/BLUE SHIELD | Source: Ambulatory Visit | Attending: Physician Assistant | Admitting: Physician Assistant

## 2018-11-18 DIAGNOSIS — Z1231 Encounter for screening mammogram for malignant neoplasm of breast: Secondary | ICD-10-CM | POA: Diagnosis not present

## 2018-11-19 ENCOUNTER — Telehealth: Payer: Self-pay

## 2018-11-19 NOTE — Telephone Encounter (Signed)
Called [facility].

## 2018-11-19 NOTE — Telephone Encounter (Signed)
-----   Message from Trinna Post, [place] sent at 11/19/2018  9:23 AM EST ----- Mammogram normal. Can we call [facility] for colonoscopy results from 2011? That's when it looks like it was her last one.

## 2018-11-19 NOTE — Telephone Encounter (Signed)
lmtcb

## 2018-11-23 NOTE — Telephone Encounter (Signed)
Patient advised as below.  

## 2018-11-23 NOTE — Telephone Encounter (Signed)
lmtcb for mammogram results

## 2019-01-10 ENCOUNTER — Emergency Department: Payer: BLUE CROSS/BLUE SHIELD

## 2019-01-10 ENCOUNTER — Other Ambulatory Visit: Payer: Self-pay

## 2019-01-10 ENCOUNTER — Inpatient Hospital Stay
Admission: EM | Admit: 2019-01-10 | Discharge: 2019-01-17 | DRG: 872 | Disposition: A | Discharge status: Home / Self Care | Payer: BLUE CROSS/BLUE SHIELD | Attending: Internal Medicine | Admitting: Internal Medicine

## 2019-01-10 DIAGNOSIS — K51919 Ulcerative colitis, unspecified with unspecified complications: Secondary | ICD-10-CM | POA: Diagnosis not present

## 2019-01-10 DIAGNOSIS — K5730 Diverticulosis of large intestine without perforation or abscess without bleeding: Secondary | ICD-10-CM | POA: Diagnosis not present

## 2019-01-10 DIAGNOSIS — K51918 Ulcerative colitis, unspecified with other complication: Secondary | ICD-10-CM | POA: Diagnosis not present

## 2019-01-10 DIAGNOSIS — Z8719 Personal history of other diseases of the digestive system: Secondary | ICD-10-CM

## 2019-01-10 DIAGNOSIS — Z1211 Encounter for screening for malignant neoplasm of colon: Secondary | ICD-10-CM | POA: Diagnosis not present

## 2019-01-10 DIAGNOSIS — E876 Hypokalemia: Secondary | ICD-10-CM | POA: Diagnosis present

## 2019-01-10 DIAGNOSIS — Z833 Family history of diabetes mellitus: Secondary | ICD-10-CM

## 2019-01-10 DIAGNOSIS — I10 Essential (primary) hypertension: Secondary | ICD-10-CM | POA: Diagnosis not present

## 2019-01-10 DIAGNOSIS — R1084 Generalized abdominal pain: Secondary | ICD-10-CM | POA: Diagnosis not present

## 2019-01-10 DIAGNOSIS — A419 Sepsis, unspecified organism: Secondary | ICD-10-CM

## 2019-01-10 DIAGNOSIS — K529 Noninfective gastroenteritis and colitis, unspecified: Secondary | ICD-10-CM

## 2019-01-10 DIAGNOSIS — K5190 Ulcerative colitis, unspecified, without complications: Secondary | ICD-10-CM | POA: Diagnosis not present

## 2019-01-10 DIAGNOSIS — E8809 Other disorders of plasma-protein metabolism, not elsewhere classified: Secondary | ICD-10-CM | POA: Diagnosis not present

## 2019-01-10 DIAGNOSIS — R109 Unspecified abdominal pain: Secondary | ICD-10-CM | POA: Diagnosis not present

## 2019-01-10 DIAGNOSIS — R6510 Systemic inflammatory response syndrome (SIRS) of non-infectious origin without acute organ dysfunction: Secondary | ICD-10-CM | POA: Diagnosis not present

## 2019-01-10 DIAGNOSIS — D638 Anemia in other chronic diseases classified elsewhere: Secondary | ICD-10-CM | POA: Diagnosis not present

## 2019-01-10 DIAGNOSIS — R509 Fever, unspecified: Secondary | ICD-10-CM | POA: Diagnosis not present

## 2019-01-10 DIAGNOSIS — K5790 Diverticulosis of intestine, part unspecified, without perforation or abscess without bleeding: Secondary | ICD-10-CM | POA: Diagnosis not present

## 2019-01-10 DIAGNOSIS — E860 Dehydration: Secondary | ICD-10-CM | POA: Diagnosis not present

## 2019-01-10 DIAGNOSIS — F419 Anxiety disorder, unspecified: Secondary | ICD-10-CM | POA: Diagnosis not present

## 2019-01-10 DIAGNOSIS — K5100 Ulcerative (chronic) pancolitis without complications: Secondary | ICD-10-CM | POA: Diagnosis not present

## 2019-01-10 DIAGNOSIS — K51019 Ulcerative (chronic) pancolitis with unspecified complications: Secondary | ICD-10-CM | POA: Diagnosis not present

## 2019-01-10 DIAGNOSIS — R739 Hyperglycemia, unspecified: Secondary | ICD-10-CM | POA: Diagnosis present

## 2019-01-10 LAB — CBC WITH DIFFERENTIAL/PLATELET
ABS IMMATURE GRANULOCYTES: 0.07 10*3/uL (ref 0.00–0.07)
Basophils Absolute: 0.1 10*3/uL (ref 0.0–0.1)
Basophils Relative: 1 %
Eosinophils Absolute: 0.7 10*3/uL — ABNORMAL HIGH (ref 0.0–0.5)
Eosinophils Relative: 6 %
HCT: 31.4 % — ABNORMAL LOW (ref 36.0–46.0)
Hemoglobin: 10.3 g/dL — ABNORMAL LOW (ref 12.0–15.0)
Immature Granulocytes: 1 %
Lymphocytes Relative: 11 %
Lymphs Abs: 1.2 10*3/uL (ref 0.7–4.0)
MCH: 30.3 pg (ref 26.0–34.0)
MCHC: 32.8 g/dL (ref 30.0–36.0)
MCV: 92.4 fL (ref 80.0–100.0)
MONO ABS: 1 10*3/uL (ref 0.1–1.0)
Monocytes Relative: 9 %
Neutro Abs: 8.5 10*3/uL — ABNORMAL HIGH (ref 1.7–7.7)
Neutrophils Relative %: 72 %
Platelets: 338 10*3/uL (ref 150–400)
RBC: 3.4 MIL/uL — ABNORMAL LOW (ref 3.87–5.11)
RDW: 11.9 % (ref 11.5–15.5)
WBC: 11.6 10*3/uL — AB (ref 4.0–10.5)
nRBC: 0 % (ref 0.0–0.2)

## 2019-01-10 LAB — URINALYSIS, COMPLETE (UACMP) WITH MICROSCOPIC
BILIRUBIN URINE: NEGATIVE
Bacteria, UA: NONE SEEN
GLUCOSE, UA: NEGATIVE mg/dL
KETONES UR: 20 mg/dL — AB
LEUKOCYTE UA: NEGATIVE
Nitrite: NEGATIVE
PH: 5 (ref 5.0–8.0)
PROTEIN: 30 mg/dL — AB
Specific Gravity, Urine: 1.03 (ref 1.005–1.030)

## 2019-01-10 LAB — LACTIC ACID, PLASMA: Lactic Acid, Venous: 0.6 mmol/L (ref 0.5–1.9)

## 2019-01-10 LAB — COMPREHENSIVE METABOLIC PANEL
ALT: 39 U/L (ref 0–44)
AST: 27 U/L (ref 15–41)
Albumin: 3.2 g/dL — ABNORMAL LOW (ref 3.5–5.0)
Alkaline Phosphatase: 58 U/L (ref 38–126)
Anion gap: 9 (ref 5–15)
BUN: 11 mg/dL (ref 6–20)
CO2: 22 mmol/L (ref 22–32)
Calcium: 8.3 mg/dL — ABNORMAL LOW (ref 8.9–10.3)
Chloride: 106 mmol/L (ref 98–111)
Creatinine, Ser: 0.54 mg/dL (ref 0.44–1.00)
GFR calc Af Amer: >60 mL/min (ref >60)
Glucose, Bld: 119 mg/dL — ABNORMAL HIGH (ref 70–99)
POTASSIUM: 3.1 mmol/L — AB (ref 3.5–5.1)
Sodium: 137 mmol/L (ref 135–145)
TOTAL PROTEIN: 6.8 g/dL (ref 6.5–8.1)
Total Bilirubin: 0.5 mg/dL (ref 0.3–1.2)

## 2019-01-10 LAB — PROCALCITONIN: Procalcitonin: <0.1 ng/mL

## 2019-01-10 LAB — PHOSPHORUS: Phosphorus: 2.9 mg/dL (ref 2.5–4.6)

## 2019-01-10 LAB — MAGNESIUM: Magnesium: 2 mg/dL (ref 1.7–2.4)

## 2019-01-10 MED ORDER — SODIUM CHLORIDE 0.9 % IV SOLN
2.0000 g | Freq: Once | INTRAVENOUS | Status: DC
  Administered 2019-01-10: 2 g via INTRAVENOUS
  Filled 2019-01-10: qty 2

## 2019-01-10 MED ORDER — IOHEXOL 300 MG/ML  SOLN
75.0000 mL | Freq: Once | INTRAMUSCULAR | Status: AC | PRN
  Administered 2019-01-10: 75 mL via INTRAVENOUS

## 2019-01-10 MED ORDER — METRONIDAZOLE IN NACL 5-0.79 MG/ML-% IV SOLN
500.0000 mg | Freq: Once | INTRAVENOUS | Status: DC
  Filled 2019-01-10: qty 100

## 2019-01-10 MED ORDER — MORPHINE SULFATE (PF) 2 MG/ML IV SOLN
2.0000 mg | INTRAVENOUS | Status: DC | PRN
  Administered 2019-01-11 – 2019-01-14 (×7): 2 mg via INTRAVENOUS
  Filled 2019-01-10 (×7): qty 1

## 2019-01-10 MED ORDER — LACTATED RINGERS IV SOLN
INTRAVENOUS | Status: AC
  Administered 2019-01-10: 23:00:00 via INTRAVENOUS

## 2019-01-10 MED ORDER — POTASSIUM CHLORIDE CRYS ER 20 MEQ PO TBCR
40.0000 meq | EXTENDED_RELEASE_TABLET | Freq: Once | ORAL | Status: AC
  Administered 2019-01-10: 40 meq via ORAL
  Filled 2019-01-10: qty 2

## 2019-01-10 MED ORDER — SODIUM CHLORIDE 0.9 % IV BOLUS
1000.0000 mL | Freq: Once | INTRAVENOUS | Status: AC
  Administered 2019-01-10: 1000 mL via INTRAVENOUS

## 2019-01-10 MED ORDER — ACETAMINOPHEN 325 MG PO TABS
ORAL_TABLET | ORAL | Status: AC
  Filled 2019-01-10: qty 2

## 2019-01-10 MED ORDER — MERCAPTOPURINE 50 MG PO TABS
50.0000 mg | ORAL_TABLET | Freq: Every day | ORAL | Status: DC
  Administered 2019-01-11 – 2019-01-17 (×7): 50 mg via ORAL
  Filled 2019-01-10 (×7): qty 1

## 2019-01-10 MED ORDER — ACETAMINOPHEN 325 MG PO TABS
650.0000 mg | ORAL_TABLET | Freq: Once | ORAL | Status: AC | PRN
  Administered 2019-01-10: 650 mg via ORAL

## 2019-01-10 MED ORDER — MORPHINE SULFATE (PF) 4 MG/ML IV SOLN: 4.0000 mg | INTRAVENOUS | Status: DC | PRN

## 2019-01-10 NOTE — ED Notes (Signed)
Pts visitor to nursing station requesting blankets for pt. Informed visitor that pt cannot have blankets due to fever.

## 2019-01-10 NOTE — ED Notes (Signed)
Informed charge nurse that patient needs to move to major. Bed pending.

## 2019-01-10 NOTE — ED Triage Notes (Addendum)
Pt A&O, ambulatory. States here for fever since Friday. States it was 99. Denies taking any tylenol or motrin. Denies cough, congestion. No distress noted. States hx of UC, denies abd pain today. Started on a new med for UC a week ago. Denies N&V&D. States "I'm just worried."

## 2019-01-10 NOTE — Progress Notes (Signed)
CODE SEPSIS - PHARMACY COMMUNICATION  **Broad Spectrum Antibiotics should be administered within 1 hour of Sepsis diagnosis  Time Code Sepsis Called/Page Received: @ 1858  Antibiotics Ordered: None   Time of 1st antibiotic administration: N/A  Additional action taken by pharmacy:  Dorise Hiss PA-C contacted @ 19:15 about no antibiotics ordered for Sepsis.   @ 19:29 pharmacy was told patient was transferred to main ED and Provider was now Dr. Quentin Cornwall.   Per Dr. Quentin Cornwall Code Sepsis canceled @ 19:31   Pernell Dupre ,PharmD Clinical Pharmacist  01/10/2019  7:32 PM

## 2019-01-10 NOTE — ED Provider Notes (Signed)
[facility] Emergency Department Provider Note    First MD Initiated Contact with Patient 01/10/19 1927     (approximate)  I have reviewed the triage vital signs and the nursing notes.   HISTORY  Chief Complaint Fever    HPI Natasha Mueller is a 53 y.o. female below listed past medical history with history of steroid refractory ulcerative colitis recently started on mercaptopurine presents to the ER with persistent and rising fever.  She is also having worsening abdominal pain.  Denies any bloody diarrhea but is having increased bowel movements.  No vomiting.  No chest pain or shortness of breath.  No congestion.  No dysuria or flank pain.  No recent antibiotic use.    Past Medical History:  Diagnosis Date  . Allergy   . BRCA negative 2013   with BART  . Family history of breast cancer    BRCA/Bart neg 2013; IBIS=15%  . UC (ulcerative colitis) ([place])    Family History  Problem Relation Age of Onset  . Breast cancer Mother 81  . Hypertension Mother   . Atrial fibrillation Father   . Diabetes Sister   . Breast cancer Sister   . Atrial fibrillation Brother    Past Surgical History:  Procedure Laterality Date  . CESAREAN SECTION     There are no active problems to display for this patient.     Prior to Admission medications   Medication Sig Start Date End Date Taking? Authorizing Provider  traZODone (DESYREL) 50 MG tablet Take 0.5-1 tablets (25-50 mg total) by mouth at bedtime as needed for sleep. 10/19/18   Trinna Post, PA-C    Allergies Hyoscyamine sulfate    Social History Social History   Tobacco Use  . Smoking status: Never Smoker  . Smokeless tobacco: Never Used  Substance Use Topics  . Alcohol use: No  . Drug use: No    Review of Systems Patient denies headaches, rhinorrhea, blurry vision, numbness, shortness of breath, chest pain, edema, cough, abdominal pain, nausea, vomiting, diarrhea, dysuria, fevers, rashes or  hallucinations unless otherwise stated above in HPI. ____________________________________________   PHYSICAL EXAM:  VITAL SIGNS: Vitals:   01/10/19 1811  BP: (!) 164/78  Pulse: (!) 114  Resp: 18  Temp: (!) 100.8 F (38.2 C)  SpO2: 100%    Constitutional: Alert and oriented.  Eyes: Conjunctivae are normal.  Head: Atraumatic. Nose: No congestion/rhinnorhea. Mouth/Throat: Mucous membranes are moist.   Neck: No stridor. Painless ROM.  Cardiovascular: Normal rate, regular rhythm. Grossly normal heart sounds.  Good peripheral circulation. Respiratory: Normal respiratory effort.  No retractions. Lungs CTAB. Gastrointestinal: Soft without focal tenderness. No distention. No abdominal bruits. No CVA tenderness. Genitourinary:  Musculoskeletal: No lower extremity tenderness nor edema.  No joint effusions. Neurologic:  Normal speech and language. No gross focal neurologic deficits are appreciated. No facial droop Skin:  Skin is warm, dry and intact. No rash noted. Psychiatric: Mood and affect are normal. Speech and behavior are normal.  ____________________________________________   LABS (all labs ordered are listed, but only abnormal results are displayed)  Results for orders placed or performed during the hospital encounter of 01/10/19 (from the past 24 hour(s))  Urinalysis, Complete w Microscopic     Status: Abnormal   Collection Time: 01/10/19  7:09 PM  Result Value Ref Range   Color, Urine YELLOW (A) YELLOW   APPearance HAZY (A) CLEAR   Specific Gravity, Urine 1.030 1.005 - 1.030   pH 5.0  5.0 - 8.0   Glucose, UA NEGATIVE NEGATIVE mg/dL   Hgb urine dipstick MODERATE (A) NEGATIVE   Bilirubin Urine NEGATIVE NEGATIVE   Ketones, ur 20 (A) NEGATIVE mg/dL   Protein, ur 30 (A) NEGATIVE mg/dL   Nitrite NEGATIVE NEGATIVE   Leukocytes,Ua NEGATIVE NEGATIVE   RBC / HPF 11-20 0 - 5 RBC/hpf   WBC, UA 0-5 0 - 5 WBC/hpf   Bacteria, UA NONE SEEN NONE SEEN   Squamous Epithelial / LPF  0-5 0 - 5   Mucus PRESENT   Comprehensive metabolic panel     Status: Abnormal   Collection Time: 01/10/19  7:09 PM  Result Value Ref Range   Sodium 137 135 - 145 mmol/L   Potassium 3.1 (L) 3.5 - 5.1 mmol/L   Chloride 106 98 - 111 mmol/L   CO2 22 22 - 32 mmol/L   Glucose, Bld 119 (H) 70 - 99 mg/dL   BUN 11 6 - 20 mg/dL   Creatinine, Ser 0.54 0.44 - 1.00 mg/dL   Calcium 8.3 (L) 8.9 - 10.3 mg/dL   Total Protein 6.8 6.5 - 8.1 g/dL   Albumin 3.2 (L) 3.5 - 5.0 g/dL   AST 27 15 - 41 U/L   ALT 39 0 - 44 U/L   Alkaline Phosphatase 58 38 - 126 U/L   Total Bilirubin 0.5 0.3 - 1.2 mg/dL   GFR calc non Af Amer >60 >60 mL/min   GFR calc Af Amer >60 >60 mL/min   Anion gap 9 5 - 15  CBC with Differential     Status: Abnormal   Collection Time: 01/10/19  7:09 PM  Result Value Ref Range   WBC 11.6 (H) 4.0 - 10.5 K/uL   RBC 3.40 (L) 3.87 - 5.11 MIL/uL   Hemoglobin 10.3 (L) 12.0 - 15.0 g/dL   HCT 31.4 (L) 36.0 - 46.0 %   MCV 92.4 80.0 - 100.0 fL   MCH 30.3 26.0 - 34.0 pg   MCHC 32.8 30.0 - 36.0 g/dL   RDW 11.9 11.5 - 15.5 %   Platelets 338 150 - 400 K/uL   nRBC 0.0 0.0 - 0.2 %   Neutrophils Relative % 72 %   Neutro Abs 8.5 (H) 1.7 - 7.7 K/uL   Lymphocytes Relative 11 %   Lymphs Abs 1.2 0.7 - 4.0 K/uL   Monocytes Relative 9 %   Monocytes Absolute 1.0 0.1 - 1.0 K/uL   Eosinophils Relative 6 %   Eosinophils Absolute 0.7 (H) 0.0 - 0.5 K/uL   Basophils Relative 1 %   Basophils Absolute 0.1 0.0 - 0.1 K/uL   Immature Granulocytes 1 %   Abs Immature Granulocytes 0.07 0.00 - 0.07 K/uL  Lactic acid, plasma     Status: None   Collection Time: 01/10/19  7:09 PM  Result Value Ref Range   Lactic Acid, Venous 0.6 0.5 - 1.9 mmol/L   ____________________________________________  EKG My review and personal interpretation at Time: 19:31   Indication: fever  Rate: 95  Rhythm: sinus Axis: normal Other: normal intervals, no stemi ____________________________________________  RADIOLOGY  I  personally reviewed all radiographic images ordered to evaluate for the above acute complaints and reviewed radiology reports and findings.  These findings were personally discussed with the patient.  Please see medical record for radiology report.  ____________________________________________   PROCEDURES  Procedure(s) performed:  Procedures    Critical Care performed: no  ____________________________________________   INITIAL IMPRESSION / ASSESSMENT AND PLAN / ED COURSE  Pertinent labs & imaging results that were available during my care of the patient were reviewed by me and considered in my medical decision making (see chart for details).   DDX: colitis, abscess, bacteremia, sepsis, pna  ERIAL FIKES is a 53 y.o. who presents to the ED with symptoms as described above.  Patient nontoxic-appearing but is febrile and tachycardic concerning for sepsis.  Blood pressure is stable.  Will provide IV fluids order blood work.  Based on her abdominal pain will order CT imaging as well as stool studies.  Clinical Course as of Jan 09 2026  Nancy Fetter Jan 10, 2019  2024 Discussed the case with Dr. Marius Ditch of GI.  Plan will be to obtain CT imaging provide IV fluids and start cefepime and Flagyl will cover intra-abdominal infection.  Pending CT imaging patient will require hospitalization for further medical management as she is immunocompromised.   [PR]    Clinical Course User Index [PR] Merlyn Lot, MD     As part of my medical decision making, I reviewed the following data within the [facility] notes reviewed and incorporated, Labs reviewed, notes from prior ED visits and [place] Controlled Substance Database   ____________________________________________   FINAL CLINICAL IMPRESSION(S) / ED DIAGNOSES  Final diagnoses:  Sepsis without acute organ dysfunction, due to unspecified organism ([place])  Ulcerative colitis with complication, unspecified location [facility])       NEW MEDICATIONS STARTED DURING THIS VISIT:  New Prescriptions   No medications on file     Note:  This document was prepared using Dragon voice recognition software and may include unintentional dictation errors.    Merlyn Lot, MD 01/10/19 2027

## 2019-01-11 DIAGNOSIS — K51919 Ulcerative colitis, unspecified with unspecified complications: Secondary | ICD-10-CM

## 2019-01-11 DIAGNOSIS — K5100 Ulcerative (chronic) pancolitis without complications: Secondary | ICD-10-CM

## 2019-01-11 LAB — GASTROINTESTINAL PANEL BY PCR, STOOL (REPLACES STOOL CULTURE)
Adenovirus F40/41: NOT DETECTED
Astrovirus: NOT DETECTED
Campylobacter species: NOT DETECTED
Cryptosporidium: NOT DETECTED
Cyclospora cayetanensis: NOT DETECTED
ENTEROTOXIGENIC E COLI (ETEC): NOT DETECTED
Entamoeba histolytica: NOT DETECTED
Enteroaggregative E coli (EAEC): NOT DETECTED
Enteropathogenic E coli (EPEC): NOT DETECTED
Giardia lamblia: NOT DETECTED
Norovirus GI/GII: NOT DETECTED
Plesimonas shigelloides: NOT DETECTED
Rotavirus A: NOT DETECTED
Salmonella species: NOT DETECTED
Sapovirus (I, II, IV, and V): NOT DETECTED
Shiga like toxin producing E coli (STEC): NOT DETECTED
Shigella/Enteroinvasive E coli (EIEC): NOT DETECTED
VIBRIO SPECIES: NOT DETECTED
Vibrio cholerae: NOT DETECTED
Yersinia enterocolitica: NOT DETECTED

## 2019-01-11 LAB — CBC WITH DIFFERENTIAL/PLATELET
ABS IMMATURE GRANULOCYTES: 0.1 10*3/uL — AB (ref 0.00–0.07)
Basophils Absolute: 0.1 10*3/uL (ref 0.0–0.1)
Basophils Relative: 1 %
Eosinophils Absolute: 0.3 10*3/uL (ref 0.0–0.5)
Eosinophils Relative: 3 %
HCT: 30 % — ABNORMAL LOW (ref 36.0–46.0)
Hemoglobin: 9.5 g/dL — ABNORMAL LOW (ref 12.0–15.0)
Immature Granulocytes: 1 %
Lymphocytes Relative: 8 %
Lymphs Abs: 0.8 10*3/uL (ref 0.7–4.0)
MCH: 30 pg (ref 26.0–34.0)
MCHC: 31.7 g/dL (ref 30.0–36.0)
MCV: 94.6 fL (ref 80.0–100.0)
MONOS PCT: 9 %
Monocytes Absolute: 0.9 10*3/uL (ref 0.1–1.0)
Neutro Abs: 8.6 10*3/uL — ABNORMAL HIGH (ref 1.7–7.7)
Neutrophils Relative %: 78 %
Platelets: 253 10*3/uL (ref 150–400)
RBC: 3.17 MIL/uL — ABNORMAL LOW (ref 3.87–5.11)
RDW: 12.1 % (ref 11.5–15.5)
WBC: 10.8 10*3/uL — ABNORMAL HIGH (ref 4.0–10.5)
nRBC: 0 % (ref 0.0–0.2)

## 2019-01-11 LAB — C-REACTIVE PROTEIN: CRP: 13.7 mg/dL — ABNORMAL HIGH (ref <1.0)

## 2019-01-11 LAB — C DIFFICILE QUICK SCREEN W PCR REFLEX
C Diff antigen: NEGATIVE
C Diff interpretation: NOT DETECTED
C Diff toxin: NEGATIVE

## 2019-01-11 LAB — PREALBUMIN: Prealbumin: 14.2 mg/dL — ABNORMAL LOW (ref 18–38)

## 2019-01-11 LAB — SEDIMENTATION RATE: Sed Rate: 60 mm/hr — ABNORMAL HIGH (ref 0–30)

## 2019-01-11 MED ORDER — METRONIDAZOLE IN NACL 5-0.79 MG/ML-% IV SOLN
500.0000 mg | Freq: Once | INTRAVENOUS | Status: AC
  Administered 2019-01-11: 500 mg via INTRAVENOUS
  Filled 2019-01-11: qty 100

## 2019-01-11 MED ORDER — ONDANSETRON HCL 4 MG PO TABS: 4.0000 mg | ORAL_TABLET | Freq: Four times a day (QID) | ORAL | Status: DC | PRN

## 2019-01-11 MED ORDER — BISACODYL 5 MG PO TBEC: 5.0000 mg | DELAYED_RELEASE_TABLET | Freq: Every day | ORAL | Status: DC | PRN

## 2019-01-11 MED ORDER — SODIUM CHLORIDE 0.9 % IV SOLN
INTRAVENOUS | Status: DC | PRN
  Administered 2019-01-11: 250 mL via INTRAVENOUS

## 2019-01-11 MED ORDER — SENNOSIDES-DOCUSATE SODIUM 8.6-50 MG PO TABS: 1.0000 | ORAL_TABLET | Freq: Every evening | ORAL | Status: DC | PRN

## 2019-01-11 MED ORDER — PIPERACILLIN-TAZOBACTAM 3.375 G IVPB
3.3750 g | Freq: Three times a day (TID) | INTRAVENOUS | Status: DC
  Administered 2019-01-11 – 2019-01-14 (×9): 3.375 g via INTRAVENOUS
  Filled 2019-01-11 (×9): qty 50

## 2019-01-11 MED ORDER — ONDANSETRON HCL 4 MG/2ML IJ SOLN
4.0000 mg | Freq: Four times a day (QID) | INTRAMUSCULAR | Status: DC | PRN
  Administered 2019-01-11 (×2): 4 mg via INTRAVENOUS
  Filled 2019-01-11 (×3): qty 2

## 2019-01-11 MED ORDER — TRAZODONE HCL 50 MG PO TABS: 25.0000 mg | ORAL_TABLET | Freq: Every evening | ORAL | Status: DC | PRN

## 2019-01-11 MED ORDER — ONDANSETRON HCL 4 MG/2ML IJ SOLN
4.0000 mg | INTRAMUSCULAR | Status: DC | PRN
  Administered 2019-01-11 – 2019-01-13 (×2): 4 mg via INTRAVENOUS
  Filled 2019-01-11: qty 2

## 2019-01-11 MED ORDER — LACTATED RINGERS IV SOLN: INTRAVENOUS | Status: DC

## 2019-01-11 MED ORDER — ACETAMINOPHEN 650 MG RE SUPP: 650.0000 mg | Freq: Four times a day (QID) | RECTAL | Status: DC | PRN

## 2019-01-11 MED ORDER — ONDANSETRON HCL 4 MG/2ML IJ SOLN: 4.0000 mg | INTRAMUSCULAR | Status: DC | PRN

## 2019-01-11 MED ORDER — SODIUM CHLORIDE 0.9 % IV SOLN
INTRAVENOUS | Status: DC
  Administered 2019-01-11 – 2019-01-15 (×14): via INTRAVENOUS

## 2019-01-11 MED ORDER — ENOXAPARIN SODIUM 40 MG/0.4ML ~~LOC~~ SOLN
40.0000 mg | SUBCUTANEOUS | Status: DC
  Filled 2019-01-11 (×2): qty 0.4

## 2019-01-11 MED ORDER — ACETAMINOPHEN 325 MG PO TABS
650.0000 mg | ORAL_TABLET | Freq: Four times a day (QID) | ORAL | Status: DC | PRN
  Administered 2019-01-11 (×2): 650 mg via ORAL
  Filled 2019-01-11 (×2): qty 2

## 2019-01-11 NOTE — H&P (Signed)
[facility] [place] NAME: Natasha Mueller    MR#:  [id number]  DATE OF BIRTH:  10-14-1966  DATE OF ADMISSION:  01/10/2019  PRIMARY CARE PHYSICIAN: Trinna Post, PA-C   REQUESTING/REFERRING PHYSICIAN: Merlyn Lot, MD  CHIEF COMPLAINT:   Chief Complaint  Patient presents with   Fever    HISTORY OF PRESENT ILLNESS:  Natasha Mueller  is a 53 y.o. female with a known history of ulcerative colitis (steroid-resistant, failed Asacol, previously on 6MP) p/w fever, AP, imaging (+) transverse colitis. Was previously seeing Dr. Vira Agar (GI), last visit 04/13/2018. Pt states she has not been back since. She states she has been off 6MP since Fall 2019. P/w 1wk Hx diffuse AP, alternating sharp/dull, intermittent, worse w/ movement/ambulation. Endorses loose stools w/ increased frequency (5-6x/day), (-) diarrhea, (-) blood in stool. (-) N/V. Low-grade fever Fri (01/08/2019) and Sat (01/09/2019). T 101.3 on Sun (01/10/2019), (+) chills, (-) rigors. (-) cough/SOB, (-) urinary symptoms. Has been eating/drinking less, dehydrated. Had leftover 6MP, started taking 73m PO qD (prior dose) ~1wk ago. States symptoms have improved somewhat since then. Febrile (T 38.2C), tachycardic, tachypneic in ED, SIRS (+). Appears ill but non-toxic. Lac 0.6, PCT < 0.10. CT A/P reports, "Irregular thickening of the wall of the left transverse colon with mild associated inflammation, consistent with active inflammatory bowel disease. No abscess or extraluminal/free air. No evidence of bowel obstruction. Associated mild mesenteric adenopathy."  PAST MEDICAL HISTORY:   Past Medical History:  Diagnosis Date   Allergy    BRCA negative 2013   with BART   Family history of breast cancer    BRCA/Bart neg 2013; IBIS=15%   [place]     PAST SURGICAL HISTORY:   Past Surgical History:  Procedure Laterality Date   CESAREAN SECTION      SOCIAL HISTORY:    Social History   Tobacco Use   Smoking status: Never Smoker   Smokeless tobacco: Never Used  Substance Use Topics   Alcohol use: No    FAMILY HISTORY:   Family History  Problem Relation Age of Onset   Breast cancer Mother [id number]  Hypertension Mother    Atrial fibrillation Father    Diabetes Sister    Breast cancer Sister    Atrial fibrillation Brother     DRUG ALLERGIES:   Allergies  Allergen Reactions   Hyoscyamine Sulfate Swelling    Tongue swelling    REVIEW OF SYSTEMS:   Review of Systems  Constitutional: Positive for chills, fever and malaise/fatigue. Negative for diaphoresis and weight loss.  HENT: Negative for congestion, ear pain, hearing loss, nosebleeds, sinus pain, sore throat and tinnitus.   Eyes: Negative for double vision and photophobia.  Respiratory: Negative for cough, hemoptysis, sputum production and shortness of breath.   Cardiovascular: Negative for chest pain, palpitations, orthopnea, claudication, leg swelling and PND.  Gastrointestinal: Positive for abdominal pain. Negative for blood in stool, constipation, diarrhea, heartburn, melena, nausea and vomiting.  Genitourinary: Negative for dysuria, frequency, hematuria and urgency.  Musculoskeletal: Negative for back pain, falls, joint pain, myalgias and neck pain.  Skin: Negative for itching and rash.  Neurological: Positive for weakness. Negative for dizziness, tingling, tremors, sensory change, speech change, focal weakness, seizures, loss of consciousness and headaches.  Psychiatric/Behavioral: Negative for depression and memory loss. The patient is not nervous/anxious and does not have insomnia.    MEDICATIONS AT HOME:   Prior to Admission medications  Medication Sig Start Date End Date Taking? Authorizing Provider  traZODone (DESYREL) 50 MG tablet Take 0.5-1 tablets (25-50 mg total) by mouth at bedtime as needed for sleep. 10/19/18   Trinna Post, PA-C      VITAL SIGNS:    Blood pressure 116/65, pulse 82, temperature 98.9 F (37.2 C), temperature source Oral, resp. rate (!) 22, height _0  (1.626 m), weight 59.4 kg, last menstrual period 06/14/2016, SpO2 100 %.  PHYSICAL EXAMINATION:  Physical Exam Constitutional:      General: She is not in acute distress.    Appearance: She is ill-appearing. She is not toxic-appearing or diaphoretic.     Interventions: She is not intubated. HENT:     Head: Atraumatic.     Mouth/Throat:     Pharynx: Oropharynx is clear.  Eyes:     General: No scleral icterus.    Extraocular Movements: Extraocular movements intact.     Conjunctiva/sclera: Conjunctivae normal.  Neck:     Musculoskeletal: Neck supple.  Cardiovascular:     Rate and Rhythm: Normal rate and regular rhythm.     Heart sounds: Normal heart sounds. No murmur. No friction rub. No gallop.   Pulmonary:     Effort: Pulmonary effort is normal. Tachypnea present. No bradypnea, accessory muscle usage, prolonged expiration, respiratory distress or retractions. She is not intubated.     Breath sounds: Normal breath sounds and air entry. No stridor, decreased air movement or transmitted upper airway sounds. No decreased breath sounds, wheezing, rhonchi or rales.  Abdominal:     General: Bowel sounds are decreased. There is no distension.     Palpations: Abdomen is soft.     Tenderness: There is generalized abdominal tenderness. There is no guarding or rebound.  Musculoskeletal: Normal range of motion.        General: No swelling or tenderness.     Right lower leg: No edema.     Left lower leg: No edema.  Lymphadenopathy:     Cervical: No cervical adenopathy.  Skin:    General: Skin is warm and dry.     Findings: No erythema or rash.  Neurological:     General: No focal deficit present.     Mental Status: She is alert. Mental status is at baseline.  Psychiatric:        Mood and Affect: Mood normal.        Behavior: Behavior normal.        Thought Content:  Thought content normal.        Judgment: Judgment normal.     LABORATORY PANEL:   CBC Recent Labs  Lab 01/10/19 1909  WBC 11.6*  HGB 10.3*  HCT 31.4*  PLT 338   ------------------------------------------------------------------------------------------------------------------  Chemistries  Recent Labs  Lab 01/10/19 1909  NA 137  K 3.1*  CL 106  CO2 22  GLUCOSE 119*  BUN 11  CREATININE 0.54  CALCIUM 8.3*  MG 2.0  AST 27  ALT 39  ALKPHOS 58  BILITOT 0.5   ------------------------------------------------------------------------------------------------------------------  Cardiac Enzymes No results for input(s): TROPONINI in the last 168 hours. ------------------------------------------------------------------------------------------------------------------  RADIOLOGY:  Dg Chest 2 View  Result Date: 01/10/2019 CLINICAL DATA:  Fever EXAM: CHEST - 2 VIEW COMPARISON:  Mar 07, 2015 FINDINGS: There is slight lateral left base atelectasis. Lungs elsewhere are clear. Heart size and pulmonary vascularity are normal. No adenopathy. No bone lesions. IMPRESSION: Slight lateral left base atelectasis. Lungs elsewhere clear. No evident adenopathy. Electronically Signed   By:  Lowella Grip III M.D.   On: 01/10/2019 20:01   Ct Abdomen Pelvis W Contrast  Result Date: 01/10/2019 CLINICAL DATA:  States here for fever since Friday. States it was 99. Denies taking any tylenol or motrin. Denies cough, congestion. No distress noted. States hx of UC, denies abd pain today. Started on a new med for UC a week ago. Denies N&V&D. States "I'm just worried." EXAM: CT ABDOMEN AND PELVIS WITH CONTRAST TECHNIQUE: Multidetector CT imaging of the abdomen and pelvis was performed using the standard protocol following bolus administration of intravenous contrast. CONTRAST:  66m OMNIPAQUE IOHEXOL 300 MG/ML  SOLN COMPARISON:  10/13/2013 FINDINGS: Lower chest: No acute abnormality. Hepatobiliary: No focal  liver abnormality is seen. No gallstones, gallbladder wall thickening, or biliary dilatation. Pancreas: Unremarkable. No pancreatic ductal dilatation or surrounding inflammatory changes. Spleen: Normal in size without focal abnormality. Adrenals/Urinary Tract: Adrenal glands are unremarkable. Kidneys are normal, without renal calculi, focal lesion, or hydronephrosis. Bladder is unremarkable. Stomach/Bowel: There is irregular wall thickening of the left transverse colon with mild associated inflammatory change in the adjacent fat. No other colonic wall thickening. There are scattered diverticula mostly along the sigmoid colon. No diverticulitis. Appendix is prominent measuring 6-7 mm in diameter, but with no associated inflammation. No convincing appendicitis. Small bowel is normal caliber no wall thickening or inflammatory change. Normal stomach. Vascular/Lymphatic: There are prominent mesenteric lymph nodes, largest measuring 10. No vascular abnormality.  Mm in short axis Reproductive: Uterus and bilateral adnexa are unremarkable. Other: No abdominal wall hernia or abnormality. No abdominopelvic ascites. Musculoskeletal: No fracture or acute finding. No osteoblastic or osteolytic lesions. IMPRESSION: 1. Irregular thickening of the wall of the left transverse colon with mild associated inflammation, consistent with active inflammatory bowel disease. No abscess or extraluminal/free air. No evidence of bowel obstruction. Associated mild mesenteric adenopathy. 2. No other acute abnormality within the abdomen or pelvis. Electronically Signed   By: DLajean ManesM.D.   On: 01/10/2019 20:37   IMPRESSION AND PLAN:   A/P: 13F w/ PMHx ulcerative colitis (steroid-resistant, failed Asacol, previously on 6MP) p/w fever, AP, CT A/P (+) transverse colitis. Hypokalemia, hyperglycemia, hypocalcemia, hypoalbuminemia, leukocytosis, normocytic anemia. -F, AP, leukocytosis, SIRS, transverse colitis, suspected UC recurrence/flare:  WBC 11.6 (< 12.0), however pt febrile (T 38.2C), tachycardic, tachypneic in ED, SIRS (+). Appears ill but non-toxic. Lac 0.6, PCT < 0.10. CT A/P reports, "Irregular thickening of the wall of the left transverse colon with mild associated inflammation, consistent with active inflammatory bowel disease. No abscess or extraluminal/free air. No evidence of bowel obstruction. Associated mild mesenteric adenopathy." Though pt meets SIRS criteria, and also technically meets criteria for sepsis, she is well-appearing. I presently harbor a low suspicion for bacterial colitis. Her presentation is more consistent w/ recurrence/flare of UC. GI consult. 6MP restarted. Symptomatic mgmt, pain ctrl. IVF. -Hypokalemia: K+ 3.1. Replete and monitor. Mag WNL (2.0). -Hypocalcemia: Ionized calcium. -Hypoalbuminemia: Prealbumin. -Normocytic anemia: Likely anemia of chronic disease. Low suspicion for active/acute bleed at present. -c/w other home meds/formulary subs as tolerated. -FEN/GI: Regular diet as tolerated. -DVT PPx: Lovenox. -Code status: Full code. -Disposition: Admission, > 2 midnights.   All the records are reviewed and case discussed with ED provider. Management plans discussed with the patient, family and they are in agreement.  CODE STATUS: Full code.  TOTAL TIME TAKING CARE OF THIS PATIENT: 75 minutes.    PArta SilenceM.D on 01/11/2019 at 1:27 AM  Between 7am to 6pm - Pager - [phone] After  6pm go to [email or web address] - password [email or web address] [facility]  [facility]  Office  [phone]  CC: Primary care physician; Trinna Post, PA-C   Note: This dictation was prepared with Dragon dictation along with smaller phrase technology. Any transcriptional errors that result from this process are unintentional.

## 2019-01-11 NOTE — Progress Notes (Signed)
[place] [facility] [place] NAME: Natasha Mueller    MR#:  [id number]  DATE OF BIRTH:  [birth year]  SUBJECTIVE:   Patient her with fevers and abdominal pain Diarrhea better only one loose stool today   REVIEW OF SYSTEMS:    Review of Systems  Constitutional: ++ fever,no chills weight loss HENT: Negative for ear pain, nosebleeds, congestion, facial swelling, rhinorrhea, neck pain, neck stiffness and ear discharge.   Respiratory: Negative for cough, shortness of breath, wheezing  Cardiovascular: Negative for chest pain, palpitations and leg swelling.  Gastrointestinal: Negative for heartburn, ++ abdominal pain, no vomiting, diarrhea or consitpation Genitourinary: Negative for dysuria, urgency, frequency, hematuria Musculoskeletal: Negative for back pain or joint pain Neurological: Negative for dizziness, seizures, syncope, focal weakness,  numbness and headaches.  Hematological: Does not bruise/bleed easily.  Psychiatric/Behavioral: Negative for hallucinations, confusion, dysphoric mood    Tolerating Diet: yes      DRUG ALLERGIES:   Allergies  Allergen Reactions  . Hyoscyamine Sulfate Swelling    Tongue swelling    VITALS:  Blood pressure 119/68, pulse 91, temperature 99.1 F (37.3 C), temperature source Oral, resp. rate 18, height 5' 4"  (1.626 m), weight 59.4 kg, last menstrual period 06/14/2016, SpO2 99 %.  PHYSICAL EXAMINATION:  Constitutional: Appears well-developed and well-nourished. No distress. HENT: Normocephalic. Marland Kitchen Oropharynx is clear and moist.  Eyes: Conjunctivae and EOM are normal. PERRLA, no scleral icterus.  Neck: Normal ROM. Neck supple. No JVD. No tracheal deviation. CVS: RRR, S1/S2 +, no murmurs, no gallops, no carotid bruit.  Pulmonary: Effort and breath sounds normal, no stridor, rhonchi, wheezes, rales.  Abdominal: Soft. BS +,  no distension, tenderness, rebound or guarding.  Musculoskeletal: Normal range of  motion. No edema and no tenderness.  Neuro: Alert. CN 2-12 grossly intact. No focal deficits. Skin: Skin is warm and dry. No rash noted. Psychiatric: Normal mood and affect.      LABORATORY PANEL:   CBC Recent Labs  Lab 01/10/19 1909  WBC 11.6*  HGB 10.3*  HCT 31.4*  PLT 338   ------------------------------------------------------------------------------------------------------------------  Chemistries  Recent Labs  Lab 01/10/19 1909  NA 137  K 3.1*  CL 106  CO2 22  GLUCOSE 119*  BUN 11  CREATININE 0.54  CALCIUM 8.3*  MG 2.0  AST 27  ALT 39  ALKPHOS 58  BILITOT 0.5   ------------------------------------------------------------------------------------------------------------------  Cardiac Enzymes No results for input(s): TROPONINI in the last 168 hours. ------------------------------------------------------------------------------------------------------------------  RADIOLOGY:  Dg Chest 2 View  Result Date: 01/10/2019 CLINICAL DATA:  Fever EXAM: CHEST - 2 VIEW COMPARISON:  Mar 07, 2015 FINDINGS: There is slight lateral left base atelectasis. Lungs elsewhere are clear. Heart size and pulmonary vascularity are normal. No adenopathy. No bone lesions. IMPRESSION: Slight lateral left base atelectasis. Lungs elsewhere clear. No evident adenopathy. Electronically Signed   By: Lowella Grip III M.D.   On: 01/10/2019 20:01   Ct Abdomen Pelvis W Contrast  Result Date: 01/10/2019 CLINICAL DATA:  States here for fever since Friday. States it was 99. Denies taking any tylenol or motrin. Denies cough, congestion. No distress noted. States hx of UC, denies abd pain today. Started on a new med for UC a week ago. Denies N&V&D. States "I'm just worried." EXAM: CT ABDOMEN AND PELVIS WITH CONTRAST TECHNIQUE: Multidetector CT imaging of the abdomen and pelvis was performed using the standard protocol following bolus administration of intravenous contrast. CONTRAST:  53m OMNIPAQUE  IOHEXOL 300 MG/ML  SOLN  COMPARISON:  10/13/2013 FINDINGS: Lower chest: No acute abnormality. Hepatobiliary: No focal liver abnormality is seen. No gallstones, gallbladder wall thickening, or biliary dilatation. Pancreas: Unremarkable. No pancreatic ductal dilatation or surrounding inflammatory changes. Spleen: Normal in size without focal abnormality. Adrenals/Urinary Tract: Adrenal glands are unremarkable. Kidneys are normal, without renal calculi, focal lesion, or hydronephrosis. Bladder is unremarkable. Stomach/Bowel: There is irregular wall thickening of the left transverse colon with mild associated inflammatory change in the adjacent fat. No other colonic wall thickening. There are scattered diverticula mostly along the sigmoid colon. No diverticulitis. Appendix is prominent measuring 6-7 mm in diameter, but with no associated inflammation. No convincing appendicitis. Small bowel is normal caliber no wall thickening or inflammatory change. Normal stomach. Vascular/Lymphatic: There are prominent mesenteric lymph nodes, largest measuring 10. No vascular abnormality.  Mm in short axis Reproductive: Uterus and bilateral adnexa are unremarkable. Other: No abdominal wall hernia or abnormality. No abdominopelvic ascites. Musculoskeletal: No fracture or acute finding. No osteoblastic or osteolytic lesions. IMPRESSION: 1. Irregular thickening of the wall of the left transverse colon with mild associated inflammation, consistent with active inflammatory bowel disease. No abscess or extraluminal/free air. No evidence of bowel obstruction. Associated mild mesenteric adenopathy. 2. No other acute abnormality within the abdomen or pelvis. Electronically Signed   By: Lajean Manes M.D.   On: 01/10/2019 20:37     ASSESSMENT AND PLAN:   53 year old female with history of ulcerative colitis who presents with fever and abdominal pain.  1.  Sepsis: Patient presented with fever and tachycardia. Sepsis is due to  colitis. Start Zosyn  2.  Ulcerative colitis: GI consultation pending Continue mercaptopurine  3.  Hypokalemia: Repleted and will recheck        Management plans discussed with the patient and she is in agreement.  CODE STATUS: full  TOTAL TIME TAKING CARE OF THIS PATIENT: 30 minutes.     POSSIBLE D/C 1-2 days, DEPENDING ON CLINICAL CONDITION.   Akeylah Hendel M.D on 01/11/2019 at 12:24 PM  Between 7am to 6pm - Pager - [phone] After 6pm go to [email or web address] - password [email or web address] [facility]  Office  [phone]  CC: Primary care physician; Trinna Post, PA-C  Note: This dictation was prepared with Dragon dictation along with smaller phrase technology. Any transcriptional errors that result from this process are unintentional.

## 2019-01-11 NOTE — Consult Note (Signed)
Lucilla Lame, MD [facility]  [address] [place], [place] [address] Phone: [phone] Fax : [phone]  Consultation  Referring Provider:     Dr. Benjie Karvonen Primary Care Physician:  Trinna Post, PA-C Primary Gastroenterologist:  Dr. Vira Agar         Reason for Consultation:     Ulcerative colitis flare  Date of Admission:  01/10/2019 Date of Consultation:  01/11/2019         HPI:   Natasha Mueller is a 53 y.o. female with a history of ulcerative colitis.  The patient states that she has been treated with 6-MP for her ulcerative colitis because she reports that she is steroid resistant.  She reports that she started to take the 6-MP last week after she started to have what she felt like was a flare.  She usually takes the 6-MP only when she is feeling poorly.  The reason she came to the hospital because she started to have fevers and abdominal pain.  The patient had a CT scan showing colitis in the transverse colon.  She had previously seen Dr. Vira Agar back in June 2019 and states that she has not followed up with him because she was told that he is no longer seeing patients.  The patient reports that since being started on antibiotics this morning she feels well without any abdominal pain or diarrhea.  The patient had a temperature of 101.3 yesterday which precipitated her coming in.  The patient was found to have a sed rate that was 60.  Her hemoglobin 2 months ago was 12.8 and she was 10.3 on admission yesterday with 9.5 today.  The patient had negative stool cultures and C. Difficile.  The patient's last colonoscopy was in 2015 and that was done for iron deficiency anemia.  There were biopsies done of the transverse descending sigmoid colon and rectum with mucosal reported to show no signs of any inflammation at that time.  Past Medical History:  Diagnosis Date  . Allergy   . BRCA negative 2013   with BART  . Family history of breast cancer    BRCA/Bart neg 2013; IBIS=15%  . UC  (ulcerative colitis) [facility])     Past Surgical History:  Procedure Laterality Date  . CESAREAN SECTION      Prior to Admission medications   Medication Sig Start Date End Date Taking? Authorizing Provider  traZODone (DESYREL) 50 MG tablet Take 0.5-1 tablets (25-50 mg total) by mouth at bedtime as needed for sleep. 10/19/18   Trinna Post, PA-C    Family History  Problem Relation Age of Onset  . Breast cancer Mother 8  . Hypertension Mother   . Atrial fibrillation Father   . Diabetes Sister   . Breast cancer Sister   . Atrial fibrillation Brother      Social History   Tobacco Use  . Smoking status: Never Smoker  . Smokeless tobacco: Never Used  Substance Use Topics  . Alcohol use: No  . Drug use: No    Allergies as of 01/10/2019 - Review Complete 01/10/2019  Allergen Reaction Noted  . Hyoscyamine sulfate Swelling 03/13/2015    Review of Systems:    All systems reviewed and negative except where noted in HPI.   Physical Exam:  Vital signs in last 24 hours: Temp:  [98.4 F (36.9 C)-102 F (38.9 C)] 98.4 F (36.9 C) (03/16 1230) Pulse Rate:  [82-114] 88 (03/16 1230) Resp:  [15-22] 16 (03/16 1230)  BP: (104-164)/(63-78) 104/63 (03/16 1230) SpO2:  [98 %-100 %] 98 % (03/16 1230) Weight:  [56.7 kg-59.4 kg] 59.4 kg (03/15 2234) Last BM Date: 01/10/19 General:   Pleasant, cooperative in NAD Head:  Normocephalic and atraumatic. Eyes:   No icterus.   Conjunctiva pink. PERRLA. Ears:  Normal auditory acuity. Neck:  Supple; no masses or thyroidomegaly Lungs: Respirations even and unlabored. Lungs clear to auscultation bilaterally.   No wheezes, crackles, or rhonchi.  Heart:  Regular rate and rhythm;  Without murmur, clicks, rubs or gallops Abdomen:  Soft, nondistended, nontender. Normal bowel sounds. No appreciable masses or hepatomegaly.  No rebound or guarding.  Rectal:  Not performed. Msk:  Symmetrical without gross deformities.   Extremities:  Without edema,  cyanosis or clubbing. Neurologic:  Alert and oriented x3;  grossly normal neurologically. Skin:  Intact without significant lesions or rashes. Cervical Nodes:  No significant cervical adenopathy. Psych:  Alert and cooperative. Normal affect.  LAB RESULTS: Recent Labs    01/10/19 1909 01/11/19 1246  WBC 11.6* 10.8*  HGB 10.3* 9.5*  HCT 31.4* 30.0*  PLT 338 253   BMET Recent Labs    01/10/19 1909  NA 137  K 3.1*  CL 106  CO2 22  GLUCOSE 119*  BUN 11  CREATININE 0.54  CALCIUM 8.3*   LFT Recent Labs    01/10/19 1909  PROT 6.8  ALBUMIN 3.2*  AST 27  ALT 39  ALKPHOS 58  BILITOT 0.5   PT/INR No results for input(s): LABPROT, INR in the last 72 hours.  STUDIES: Dg Chest 2 View  Result Date: 01/10/2019 CLINICAL DATA:  Fever EXAM: CHEST - 2 VIEW COMPARISON:  Mar 07, 2015 FINDINGS: There is slight lateral left base atelectasis. Lungs elsewhere are clear. Heart size and pulmonary vascularity are normal. No adenopathy. No bone lesions. IMPRESSION: Slight lateral left base atelectasis. Lungs elsewhere clear. No evident adenopathy. Electronically Signed   By: Lowella Grip III M.D.   On: 01/10/2019 20:01   Ct Abdomen Pelvis W Contrast  Result Date: 01/10/2019 CLINICAL DATA:  States here for fever since Friday. States it was 99. Denies taking any tylenol or motrin. Denies cough, congestion. No distress noted. States hx of UC, denies abd pain today. Started on a new med for UC a week ago. Denies N&V&D. States "I'm just worried." EXAM: CT ABDOMEN AND PELVIS WITH CONTRAST TECHNIQUE: Multidetector CT imaging of the abdomen and pelvis was performed using the standard protocol following bolus administration of intravenous contrast. CONTRAST:  61m OMNIPAQUE IOHEXOL 300 MG/ML  SOLN COMPARISON:  10/13/2013 FINDINGS: Lower chest: No acute abnormality. Hepatobiliary: No focal liver abnormality is seen. No gallstones, gallbladder wall thickening, or biliary dilatation. Pancreas:  Unremarkable. No pancreatic ductal dilatation or surrounding inflammatory changes. Spleen: Normal in size without focal abnormality. Adrenals/Urinary Tract: Adrenal glands are unremarkable. Kidneys are normal, without renal calculi, focal lesion, or hydronephrosis. Bladder is unremarkable. Stomach/Bowel: There is irregular wall thickening of the left transverse colon with mild associated inflammatory change in the adjacent fat. No other colonic wall thickening. There are scattered diverticula mostly along the sigmoid colon. No diverticulitis. Appendix is prominent measuring 6-7 mm in diameter, but with no associated inflammation. No convincing appendicitis. Small bowel is normal caliber no wall thickening or inflammatory change. Normal stomach. Vascular/Lymphatic: There are prominent mesenteric lymph nodes, largest measuring 10. No vascular abnormality.  Mm in short axis Reproductive: Uterus and bilateral adnexa are unremarkable. Other: No abdominal wall hernia or abnormality. No abdominopelvic ascites.  Musculoskeletal: No fracture or acute finding. No osteoblastic or osteolytic lesions. IMPRESSION: 1. Irregular thickening of the wall of the left transverse colon with mild associated inflammation, consistent with active inflammatory bowel disease. No abscess or extraluminal/free air. No evidence of bowel obstruction. Associated mild mesenteric adenopathy. 2. No other acute abnormality within the abdomen or pelvis. Electronically Signed   By: Lajean Manes M.D.   On: 01/10/2019 20:37      Impression / Plan:   Assessment: Active Problems:   Colitis   Natasha Mueller is a 53 y.o. y/o female with a longstanding history of ulcerative colitis with her last colonoscopy 5 years ago.  The patient reports that her repeat was planned for 5 years despite her having ulcerative colitis for over 10 years.  She also reports that she is steroid resistant and her most recent CT scan shows her to have colonic inflammation  predominantly in the left transverse colon.  The patient takes her 6-MP only when she has flares and has not been taking any 5-ASA's because she states that it does not work for her.  The patient also reports that she is feeling much better on antibiotics at this time.  Plan: The patient will be set up for a colonoscopy to look for the severity of her ulcerative colitis.  The patient is doing much better on antibiotics and will be continued on her 6-MP.  The patient has been told that she will need regular follow-up with gastroenterology and should stay on maintenance therapy so that she does not have these flares that have resulted in her hospital admission.  She is also been told of the risk of cancer with ulcerative colitis and that every 5 years for a colonoscopy is not appropriate.  The patient has been explained the plan and agrees with it.  Thank you for involving me in the care of this patient.      LOS: 1 day   Lucilla Lame, MD  01/11/2019, 1:56 PM    Note: This dictation was prepared with Dragon dictation along with smaller phrase technology. Any transcriptional errors that result from this process are unintentional.

## 2019-01-11 NOTE — Consult Note (Signed)
Pharmacy Antibiotic Note  Natasha Mueller is a 53 y.o. female admitted on 01/10/2019 with Intra-Abdominal infection.  Pharmacy has been consulted for Zosyn dosing.  Plan: Zosyn 3.375g IV q8h (4 hour infusion).  Height: 5' 4"  (162.6 cm) Weight: 130 lb 14.4 oz (59.4 kg) IBW/kg (Calculated) : 54.7  Temp (24hrs), Avg:100 F (37.8 C), Min:98.9 F (37.2 C), Max:102 F (38.9 C)  Recent Labs  Lab 01/10/19 1909  WBC 11.6*  CREATININE 0.54  LATICACIDVEN 0.6    Estimated Creatinine Clearance: 68.6 mL/min (by C-G formula based on SCr of 0.54 mg/dL).    Allergies  Allergen Reactions  . Hyoscyamine Sulfate Swelling    Tongue swelling    Antimicrobials this admission: Cefepime 3/15 x 1 Metronidazole 3/15 x 1 Zosyn 3/16 >>  Dose adjustments this admission: None  Microbiology results: 3/15 BCx: pending 3/15 CDiff/GI panel pending 3/15 UCx pending   Thank you for allowing pharmacy to be a part of this patient's care.  Lu Duffel, PharmD, BCPS Clinical Pharmacist 01/11/2019 9:12 AM

## 2019-01-12 DIAGNOSIS — K529 Noninfective gastroenteritis and colitis, unspecified: Secondary | ICD-10-CM

## 2019-01-12 LAB — CBC
HCT: 27.2 % — ABNORMAL LOW (ref 36.0–46.0)
HEMOGLOBIN: 8.4 g/dL — AB (ref 12.0–15.0)
MCH: 29.5 pg (ref 26.0–34.0)
MCHC: 30.9 g/dL (ref 30.0–36.0)
MCV: 95.4 fL (ref 80.0–100.0)
Platelets: 221 10*3/uL (ref 150–400)
RBC: 2.85 MIL/uL — AB (ref 3.87–5.11)
RDW: 12.3 % (ref 11.5–15.5)
WBC: 8.5 10*3/uL (ref 4.0–10.5)
nRBC: 0 % (ref 0.0–0.2)

## 2019-01-12 LAB — BASIC METABOLIC PANEL
Anion gap: 6 (ref 5–15)
BUN: <5 mg/dL — ABNORMAL LOW (ref 6–20)
CHLORIDE: 112 mmol/L — AB (ref 98–111)
CO2: 23 mmol/L (ref 22–32)
Calcium: 7.7 mg/dL — ABNORMAL LOW (ref 8.9–10.3)
Creatinine, Ser: 0.57 mg/dL (ref 0.44–1.00)
GFR calc Af Amer: >60 mL/min (ref >60)
GFR calc non Af Amer: >60 mL/min (ref >60)
Glucose, Bld: 114 mg/dL — ABNORMAL HIGH (ref 70–99)
POTASSIUM: 3.1 mmol/L — AB (ref 3.5–5.1)
Sodium: 141 mmol/L (ref 135–145)

## 2019-01-12 LAB — URINE CULTURE: Culture: <10000 — AB

## 2019-01-12 LAB — CALCIUM, IONIZED: Calcium, Ionized, Serum: 4.8 mg/dL (ref 4.5–5.6)

## 2019-01-12 MED ORDER — POTASSIUM CHLORIDE 10 MEQ/100ML IV SOLN
10.0000 meq | INTRAVENOUS | Status: AC
  Administered 2019-01-12 (×3): 10 meq via INTRAVENOUS
  Filled 2019-01-12 (×3): qty 100

## 2019-01-12 MED ORDER — METHYLPREDNISOLONE SODIUM SUCC 125 MG IJ SOLR
60.0000 mg | Freq: Every day | INTRAMUSCULAR | Status: DC
  Administered 2019-01-12 – 2019-01-17 (×6): 60 mg via INTRAVENOUS
  Filled 2019-01-12 (×6): qty 2

## 2019-01-12 MED ORDER — ALPRAZOLAM 0.25 MG PO TABS
0.2500 mg | ORAL_TABLET | Freq: Two times a day (BID) | ORAL | Status: DC | PRN
  Administered 2019-01-12 – 2019-01-13 (×3): 0.25 mg via ORAL
  Filled 2019-01-12 (×3): qty 1

## 2019-01-12 NOTE — Progress Notes (Signed)
[facility] [place] NAME: Natasha Mueller    MR#:  [id number]  DATE OF BIRTH:  August 31, 1966  SUBJECTIVE:   Patient with nausea this morning.  Continues to have cramp-like abdominal pain.  Feels some anxiety.  REVIEW OF SYSTEMS:    Review of Systems  Constitutional: ++ fever,no chills weight loss HENT: Negative for ear pain, nosebleeds, congestion, facial swelling, rhinorrhea, neck pain, neck stiffness and ear discharge.   Respiratory: Negative for cough, shortness of breath, wheezing  Cardiovascular: Negative for chest pain, palpitations and leg swelling.  Gastrointestinal: Negative for heartburn, ++ abdominal pain/nausea/ vomiting, + diarrhea no consitpation Genitourinary: Negative for dysuria, urgency, frequency, hematuria Musculoskeletal: Negative for back pain or joint pain Neurological: Negative for dizziness, seizures, syncope, focal weakness,  numbness and headaches.  Hematological: Does not bruise/bleed easily.  Psychiatric/Behavioral: Negative for hallucinations, confusion, dysphoric mood    Tolerating Diet: yes      DRUG ALLERGIES:   Allergies  Allergen Reactions  . Hyoscyamine Sulfate Swelling    Tongue swelling    VITALS:  Blood pressure 95/60, pulse 82, temperature 98.8 F (37.1 C), temperature source Oral, resp. rate 18, height 5' 4"  (1.626 m), weight 59.4 kg, last menstrual period 06/14/2016, SpO2 100 %.  PHYSICAL EXAMINATION:  Constitutional: Appears well-developed and well-nourished. No distress. HENT: Normocephalic. Marland Kitchen Oropharynx is clear and moist.  Eyes: Conjunctivae and EOM are normal. PERRLA, no scleral icterus.  Neck: Normal ROM. Neck supple. No JVD. No tracheal deviation. CVS: RRR, S1/S2 +, no murmurs, no gallops, no carotid bruit.  Pulmonary: Effort and breath sounds normal, no stridor, rhonchi, wheezes, rales.  Abdominal: Soft. BS +,  no distension, tenderness, rebound or guarding.  Musculoskeletal:  Normal range of motion. No edema and no tenderness.  Neuro: Alert. CN 2-12 grossly intact. No focal deficits. Skin: Skin is warm and dry. No rash noted. Psychiatric: Normal mood and affect.      LABORATORY PANEL:   CBC Recent Labs  Lab 01/12/19 0311  WBC 8.5  HGB 8.4*  HCT 27.2*  PLT 221   ------------------------------------------------------------------------------------------------------------------  Chemistries  Recent Labs  Lab 01/10/19 1909 01/12/19 0311  NA 137 141  K 3.1* 3.1*  CL 106 112*  CO2 22 23  GLUCOSE 119* 114*  BUN 11 <5*  CREATININE 0.54 0.57  CALCIUM 8.3* 7.7*  MG 2.0  --   AST 27  --   ALT 39  --   ALKPHOS 58  --   BILITOT 0.5  --    ------------------------------------------------------------------------------------------------------------------  Cardiac Enzymes No results for input(s): TROPONINI in the last 168 hours. ------------------------------------------------------------------------------------------------------------------  RADIOLOGY:  Dg Chest 2 View  Result Date: 01/10/2019 CLINICAL DATA:  Fever EXAM: CHEST - 2 VIEW COMPARISON:  Mar 07, 2015 FINDINGS: There is slight lateral left base atelectasis. Lungs elsewhere are clear. Heart size and pulmonary vascularity are normal. No adenopathy. No bone lesions. IMPRESSION: Slight lateral left base atelectasis. Lungs elsewhere clear. No evident adenopathy. Electronically Signed   By: Lowella Grip III M.D.   On: 01/10/2019 20:01   Ct Abdomen Pelvis W Contrast  Result Date: 01/10/2019 CLINICAL DATA:  States here for fever since Friday. States it was 99. Denies taking any tylenol or motrin. Denies cough, congestion. No distress noted. States hx of UC, denies abd pain today. Started on a new med for UC a week ago. Denies N&V&D. States "I'm just worried." EXAM: CT ABDOMEN AND PELVIS WITH CONTRAST TECHNIQUE: Multidetector CT imaging of  the abdomen and pelvis was performed using the standard  protocol following bolus administration of intravenous contrast. CONTRAST:  63m OMNIPAQUE IOHEXOL 300 MG/ML  SOLN COMPARISON:  10/13/2013 FINDINGS: Lower chest: No acute abnormality. Hepatobiliary: No focal liver abnormality is seen. No gallstones, gallbladder wall thickening, or biliary dilatation. Pancreas: Unremarkable. No pancreatic ductal dilatation or surrounding inflammatory changes. Spleen: Normal in size without focal abnormality. Adrenals/Urinary Tract: Adrenal glands are unremarkable. Kidneys are normal, without renal calculi, focal lesion, or hydronephrosis. Bladder is unremarkable. Stomach/Bowel: There is irregular wall thickening of the left transverse colon with mild associated inflammatory change in the adjacent fat. No other colonic wall thickening. There are scattered diverticula mostly along the sigmoid colon. No diverticulitis. Appendix is prominent measuring 6-7 mm in diameter, but with no associated inflammation. No convincing appendicitis. Small bowel is normal caliber no wall thickening or inflammatory change. Normal stomach. Vascular/Lymphatic: There are prominent mesenteric lymph nodes, largest measuring 10. No vascular abnormality.  Mm in short axis Reproductive: Uterus and bilateral adnexa are unremarkable. Other: No abdominal wall hernia or abnormality. No abdominopelvic ascites. Musculoskeletal: No fracture or acute finding. No osteoblastic or osteolytic lesions. IMPRESSION: 1. Irregular thickening of the wall of the left transverse colon with mild associated inflammation, consistent with active inflammatory bowel disease. No abscess or extraluminal/free air. No evidence of bowel obstruction. Associated mild mesenteric adenopathy. 2. No other acute abnormality within the abdomen or pelvis. Electronically Signed   By: DLajean ManesM.D.   On: 01/10/2019 20:37     ASSESSMENT AND PLAN:   53year old female with history of ulcerative colitis who presents with fever and abdominal  pain.  1.  Sepsis: Patient presented with fever and tachycardia. Sepsis is due to colitis. Continue Zosyn  2.  Ulcerative colitis: Patient will be started on IV steroids as per GI.   Continue mercaptopurine Needs colonoscopy but unable to take prep.  GI is recommending outpatient colonoscopy unless symptoms worsen.   3.  Hypokalemia: Replete and recheck in a.m.   4  Acute on chronic anemia: Hemoglobin is relatively stable.  I suspect some of this is in part due to dilution. CBC for a.m.  5.  Anxiety: Xanax as needed  Discussed with GI  Management plans discussed with the patient and daughter she is in agreement.  CODE STATUS: full  TOTAL TIME TAKING CARE OF THIS PATIENT: 24 minutes.     POSSIBLE D/C 1-2 days, DEPENDING ON CLINICAL CONDITION.   SBettey CostaM.D on 01/12/2019 at 11:28 AM  Between 7am to 6pm - Pager - [phone] After 6pm go to [email or web address] - password [email or web address]  Office  [phone] CC: Primary care physician; PTrinna Post PA-C  Note: This dictation was prepared with Dragon dictation along with smaller phrase technology. Any transcriptional errors that result from this process are unintentional.

## 2019-01-12 NOTE — Progress Notes (Signed)
Natasha Lame, MD [facility]   [address], [place], [place] [address] Phone: [phone] Fax : [phone]   Subjective: The patient reports that she is feeling better than she did before coming in but had some nausea vomiting last night.  The patient was told about the possibility of doing a colonoscopy whereupon she stated that she could definitely not handle a prep.  There is no report of any black stools or bloody stools although her hemoglobin has dropped slightly overnight.   Objective: Vital signs in last 24 hours: Vitals:   01/11/19 1620 01/11/19 1757 01/11/19 2020 01/12/19 0453  BP:   (!) 94/54 95/60  Pulse:   83 82  Resp:   20 18  Temp: (!) 100.5 F (38.1 C) 99.9 F (37.7 C) 99 F (37.2 C) 98.8 F (37.1 C)  TempSrc: Oral Oral Oral Oral  SpO2:   99% 100%  Weight:      Height:       Weight change:   Intake/Output Summary (Last 24 hours) at 01/12/2019 0806 Last data filed at 01/12/2019 2536 Gross per 24 hour  Intake 2787.26 ml  Output 950 ml  Net 1837.26 ml     Exam: Heart:: Regular rate and rhythm, S1S2 present or without murmur or extra heart sounds Lungs: normal and clear to auscultation and percussion Abdomen: Soft mildly tender over the upper part of the abdomen without rebound without guarding.   Lab Results: @LABTEST2 @ Micro Results: Recent Results (from the past 240 hour(s))  Blood Culture (routine x 2)     Status: None (Preliminary result)   Collection Time: 01/10/19  7:09 PM  Result Value Ref Range Status   Specimen Description BLOOD RIGHT HAND  Final   Special Requests   Final    BOTTLES DRAWN AEROBIC AND ANAEROBIC Blood Culture adequate volume   Culture   Final    NO GROWTH < 12 HOURS Performed at [facility], [address] [place], [place] [address]    Report Status PENDING  Incomplete  Blood Culture (routine x 2)     Status: None (Preliminary result)   Collection Time: 01/10/19  7:09 PM  Result Value Ref Range Status    Specimen Description BLOOD LEFT ANTECUBITAL  Final   Special Requests   Final    BOTTLES DRAWN AEROBIC AND ANAEROBIC Blood Culture adequate volume   Culture   Final    NO GROWTH < 12 HOURS Performed at [facility], [address], [place], [place] [address]    Report Status PENDING  Incomplete  C difficile quick scan w PCR reflex     Status: None   Collection Time: 01/11/19 10:08 AM  Result Value Ref Range Status   C Diff antigen NEGATIVE NEGATIVE Final   C Diff toxin NEGATIVE NEGATIVE Final   C Diff interpretation No C. difficile detected.  Final    Comment: Performed at [facility], [address] [place], [place] [address]  Gastrointestinal Panel by PCR , Stool     Status: None   Collection Time: 01/11/19 10:08 AM  Result Value Ref Range Status   Campylobacter species NOT DETECTED NOT DETECTED Final   Plesimonas shigelloides NOT DETECTED NOT DETECTED Final   Salmonella species NOT DETECTED NOT DETECTED Final   Yersinia enterocolitica NOT DETECTED NOT DETECTED Final   Vibrio species NOT DETECTED NOT DETECTED Final   Vibrio cholerae NOT DETECTED NOT DETECTED Final   Enteroaggregative E coli (EAEC) NOT DETECTED NOT DETECTED  Final   Enteropathogenic E coli (EPEC) NOT DETECTED NOT DETECTED Final   Enterotoxigenic E coli (ETEC) NOT DETECTED NOT DETECTED Final   Shiga like toxin producing E coli (STEC) NOT DETECTED NOT DETECTED Final   Shigella/Enteroinvasive E coli (EIEC) NOT DETECTED NOT DETECTED Final   Cryptosporidium NOT DETECTED NOT DETECTED Final   Cyclospora cayetanensis NOT DETECTED NOT DETECTED Final   Entamoeba histolytica NOT DETECTED NOT DETECTED Final   Giardia lamblia NOT DETECTED NOT DETECTED Final   Adenovirus F40/41 NOT DETECTED NOT DETECTED Final   Astrovirus NOT DETECTED NOT DETECTED Final   Norovirus GI/GII NOT DETECTED NOT DETECTED Final   Rotavirus A NOT DETECTED NOT DETECTED Final   Sapovirus (I, II, IV, and V) NOT DETECTED NOT  DETECTED Final    Comment: Performed at [facility], [address] [place], [place] [address]   Studies/Results: Dg Chest 2 View  Result Date: 01/10/2019 CLINICAL DATA:  Fever EXAM: CHEST - 2 VIEW COMPARISON:  Mar 07, 2015 FINDINGS: There is slight lateral left base atelectasis. Lungs elsewhere are clear. Heart size and pulmonary vascularity are normal. No adenopathy. No bone lesions. IMPRESSION: Slight lateral left base atelectasis. Lungs elsewhere clear. No evident adenopathy. Electronically Signed   By: Lowella Grip III M.D.   On: 01/10/2019 20:01   Ct Abdomen Pelvis W Contrast  Result Date: 01/10/2019 CLINICAL DATA:  States here for fever since Friday. States it was 99. Denies taking any tylenol or motrin. Denies cough, congestion. No distress noted. States hx of UC, denies abd pain today. Started on a new med for UC a week ago. Denies N&V&D. States "I'm just worried." EXAM: CT ABDOMEN AND PELVIS WITH CONTRAST TECHNIQUE: Multidetector CT imaging of the abdomen and pelvis was performed using the standard protocol following bolus administration of intravenous contrast. CONTRAST:  64m OMNIPAQUE IOHEXOL 300 MG/ML  SOLN COMPARISON:  10/13/2013 FINDINGS: Lower chest: No acute abnormality. Hepatobiliary: No focal liver abnormality is seen. No gallstones, gallbladder wall thickening, or biliary dilatation. Pancreas: Unremarkable. No pancreatic ductal dilatation or surrounding inflammatory changes. Spleen: Normal in size without focal abnormality. Adrenals/Urinary Tract: Adrenal glands are unremarkable. Kidneys are normal, without renal calculi, focal lesion, or hydronephrosis. Bladder is unremarkable. Stomach/Bowel: There is irregular wall thickening of the left transverse colon with mild associated inflammatory change in the adjacent fat. No other colonic wall thickening. There are scattered diverticula mostly along the sigmoid colon. No diverticulitis. Appendix is prominent measuring 6-7  mm in diameter, but with no associated inflammation. No convincing appendicitis. Small bowel is normal caliber no wall thickening or inflammatory change. Normal stomach. Vascular/Lymphatic: There are prominent mesenteric lymph nodes, largest measuring 10. No vascular abnormality.  Mm in short axis Reproductive: Uterus and bilateral adnexa are unremarkable. Other: No abdominal wall hernia or abnormality. No abdominopelvic ascites. Musculoskeletal: No fracture or acute finding. No osteoblastic or osteolytic lesions. IMPRESSION: 1. Irregular thickening of the wall of the left transverse colon with mild associated inflammation, consistent with active inflammatory bowel disease. No abscess or extraluminal/free air. No evidence of bowel obstruction. Associated mild mesenteric adenopathy. 2. No other acute abnormality within the abdomen or pelvis. Electronically Signed   By: DLajean ManesM.D.   On: 01/10/2019 20:37   Medications: I have reviewed the patient's current medications. Scheduled Meds: . enoxaparin (LOVENOX) injection  40 mg Subcutaneous Q24H  . mercaptopurine  50 mg Oral Daily   Continuous Infusions: . sodium chloride 125 mL/hr at 01/12/19 0725  . sodium chloride Stopped (01/12/19  0315)  . piperacillin-tazobactam (ZOSYN)  IV 3.375 g (01/12/19 0315)  . potassium chloride     PRN Meds:.sodium chloride, acetaminophen **OR** acetaminophen, bisacodyl, morphine injection **OR** morphine injection, ondansetron **OR** ondansetron (ZOFRAN) IV, ondansetron (ZOFRAN) IV, senna-docusate, traZODone   Assessment: Active Problems:   Colitis   Ulcerative colitis with complication ([place])    Plan: The patient appears to be improving slowly but will be unable to take a prep for a colonoscopy.  The patient will be started on IV steroids although she has been resisted in the past this synergistically with 6-MP may be helpful.  I have discussed this with the hospitalist and we will hold off on any endoscopic  procedures at this time.   LOS: 2 days   Natasha Mueller 01/12/2019, 8:06 AM

## 2019-01-13 DIAGNOSIS — K51019 Ulcerative (chronic) pancolitis with unspecified complications: Secondary | ICD-10-CM

## 2019-01-13 LAB — CBC
HCT: 28.4 % — ABNORMAL LOW (ref 36.0–46.0)
Hemoglobin: 8.8 g/dL — ABNORMAL LOW (ref 12.0–15.0)
MCH: 30 pg (ref 26.0–34.0)
MCHC: 31 g/dL (ref 30.0–36.0)
MCV: 96.9 fL (ref 80.0–100.0)
Platelets: 272 10*3/uL (ref 150–400)
RBC: 2.93 MIL/uL — ABNORMAL LOW (ref 3.87–5.11)
RDW: 12.6 % (ref 11.5–15.5)
WBC: 9.9 10*3/uL (ref 4.0–10.5)
nRBC: 0 % (ref 0.0–0.2)

## 2019-01-13 LAB — BASIC METABOLIC PANEL
Anion gap: 6 (ref 5–15)
BUN: <5 mg/dL — ABNORMAL LOW (ref 6–20)
CO2: 23 mmol/L (ref 22–32)
CREATININE: 0.52 mg/dL (ref 0.44–1.00)
Calcium: 7.9 mg/dL — ABNORMAL LOW (ref 8.9–10.3)
Chloride: 114 mmol/L — ABNORMAL HIGH (ref 98–111)
GFR calc Af Amer: >60 mL/min (ref >60)
GFR calc non Af Amer: >60 mL/min (ref >60)
Glucose, Bld: 105 mg/dL — ABNORMAL HIGH (ref 70–99)
Potassium: 3.6 mmol/L (ref 3.5–5.1)
Sodium: 143 mmol/L (ref 135–145)

## 2019-01-13 MED ORDER — ALPRAZOLAM 0.25 MG PO TABS
0.2500 mg | ORAL_TABLET | Freq: Three times a day (TID) | ORAL | Status: DC | PRN
  Administered 2019-01-13 – 2019-01-15 (×4): 0.25 mg via ORAL
  Filled 2019-01-13 (×6): qty 1

## 2019-01-13 NOTE — Progress Notes (Signed)
[place] [facility] [place] NAME: Natasha Mueller    MR#:  [id number]  DATE OF BIRTH:  Sep 07, 1966  SUBJECTIVE:   Patient had a good day yesterday after the morning however this am feeling not so well Tolerated diet but feels nauseas  More loose stools today and abd cramping REVIEW OF SYSTEMS:    Review of Systems  Constitutional: ++ fever,no chills weight loss HENT: Negative for ear pain, nosebleeds, congestion, facial swelling, rhinorrhea, neck pain, neck stiffness and ear discharge.   Respiratory: Negative for cough, shortness of breath, wheezing  Cardiovascular: Negative for chest pain, palpitations and leg swelling.  Gastrointestinal: Negative for heartburn, ++ abdominal pain/nausea NOvomiting, + diarrhea no consitpation Genitourinary: Negative for dysuria, urgency, frequency, hematuria Musculoskeletal: Negative for back pain or joint pain Neurological: Negative for dizziness, seizures, syncope, focal weakness,  numbness and headaches.  Hematological: Does not bruise/bleed easily.  Psychiatric/Behavioral: Negative for hallucinations, confusion, dysphoric mood    Tolerating Diet: yes      DRUG ALLERGIES:   Allergies  Allergen Reactions  . Hyoscyamine Sulfate Swelling    Tongue swelling    VITALS:  Blood pressure 100/65, pulse (!) 53, temperature 98.7 F (37.1 C), temperature source Oral, resp. rate 17, height 5' 4"  (1.626 m), weight 59.4 kg, last menstrual period 06/14/2016, SpO2 100 %.  PHYSICAL EXAMINATION:  Constitutional: Appears well-developed and well-nourished. No distress. HENT: Normocephalic. Marland Kitchen Oropharynx is clear and moist.  Eyes: Conjunctivae and EOM are normal. PERRLA, no scleral icterus.  Neck: Normal ROM. Neck supple. No JVD. No tracheal deviation. CVS: RRR, S1/S2 +, no murmurs, no gallops, no carotid bruit.  Pulmonary: Effort and breath sounds normal, no stridor, rhonchi, wheezes, rales.  Abdominal: Soft. BS +,  no  distension, tenderness, rebound or guarding.  Musculoskeletal: Normal range of motion. No edema and no tenderness.  Neuro: Alert. CN 2-12 grossly intact. No focal deficits. Skin: Skin is warm and dry. No rash noted. Psychiatric: Normal mood and affect.      LABORATORY PANEL:   CBC Recent Labs  Lab 01/13/19 0518  WBC 9.9  HGB 8.8*  HCT 28.4*  PLT 272   ------------------------------------------------------------------------------------------------------------------  Chemistries  Recent Labs  Lab 01/10/19 1909  01/13/19 0518  NA 137   < > 143  K 3.1*   < > 3.6  CL 106   < > 114*  CO2 22   < > 23  GLUCOSE 119*   < > 105*  BUN 11   < > <5*  CREATININE 0.54   < > 0.52  CALCIUM 8.3*   < > 7.9*  MG 2.0  --   --   AST 27  --   --   ALT 39  --   --   ALKPHOS 58  --   --   BILITOT 0.5  --   --    < > = values in this interval not displayed.   ------------------------------------------------------------------------------------------------------------------  Cardiac Enzymes No results for input(s): TROPONINI in the last 168 hours. ------------------------------------------------------------------------------------------------------------------  RADIOLOGY:  No results found.   ASSESSMENT AND PLAN:   53 year old female with history of ulcerative colitis who presents with fever and abdominal pain.  1.  Sepsis: Patient presented with fever and tachycardia. Sepsis is due to colitis. Continue Zosyn  2.  Ulcerative colitis: Continue IV steroids as per GI.   Continue mercaptopurine Needs colonoscopy but unable to take prep.  GI is recommending outpatient colonoscopy unless symptoms worsen.  3.  Hypokalemia: Replete PRN   4  Acute on chronic anemia: Hemoglobin is stable.  I   5.  Anxiety: Xanax as needed    Management plans discussed with the patient and daughter she is in agreement.  CODE STATUS: full  TOTAL TIME TAKING CARE OF THIS PATIENT: 24 minutes.      POSSIBLE D/C 1-2 days, DEPENDING ON CLINICAL CONDITION.   [name] M.D on 01/13/2019 at 11:45 AM  Between 7am to 6pm - Pager - [phone] After 6pm go to [email or web address] - password [email or web address] [facility]  Office  [phone]  CC: Primary care physician; Trinna Post, PA-C  Note: This dictation was prepared with Dragon dictation along with smaller phrase technology. Any transcriptional errors that result from this process are unintentional.

## 2019-01-13 NOTE — Progress Notes (Signed)
Natasha Lame, MD [facility]   [address], [place], [place] [address] Phone: [phone] Fax : [phone]   Subjective: The patient reports that she had a very good day yesterday but started to feel nausea after eating today.  The patient reports that after getting the steroids in the morning she was energized throughout the day but started to feel poorly at the end of the day.  She now reports that she felt better this morning with less nausea.  She does report that her stool frequency has increased.  The patient does report that her abdominal pain is better than it had been previously.   Objective: Vital signs in last 24 hours: Vitals:   01/12/19 1143 01/12/19 2024 01/13/19 0349 01/13/19 1150  BP: 111/66 98/86 100/65 112/65  Pulse: 68 (!) 55 (!) 53 (!) 56  Resp: 17 17 17    Temp: 99 F (37.2 C) 98.8 F (37.1 C) 98.7 F (37.1 C) 98.6 F (37 C)  TempSrc: Oral Oral Oral Oral  SpO2: 97% 100% 100% 99%  Weight:      Height:       Weight change:   Intake/Output Summary (Last 24 hours) at 01/13/2019 1402 Last data filed at 01/13/2019 0930 Gross per 24 hour  Intake 2629.3 ml  Output 801 ml  Net 1828.3 ml     Exam: Heart:: Regular rate and rhythm, S1S2 present or without murmur or extra heart sounds Lungs: normal and clear to auscultation and percussion Abdomen: soft, nontender, normal bowel sounds   Lab Results: @LABTEST2 @ Micro Results: Recent Results (from the past 240 hour(s))  Blood Culture (routine x 2)     Status: None (Preliminary result)   Collection Time: 01/10/19  7:09 PM  Result Value Ref Range Status   Specimen Description BLOOD RIGHT HAND  Final   Special Requests   Final    BOTTLES DRAWN AEROBIC AND ANAEROBIC Blood Culture adequate volume   Culture   Final    NO GROWTH 3 DAYS Performed at [facility], [address] [place], [place] [address]    Report Status PENDING  Incomplete  Blood Culture (routine x 2)     Status: None  (Preliminary result)   Collection Time: 01/10/19  7:09 PM  Result Value Ref Range Status   Specimen Description BLOOD LEFT ANTECUBITAL  Final   Special Requests   Final    BOTTLES DRAWN AEROBIC AND ANAEROBIC Blood Culture adequate volume   Culture   Final    NO GROWTH 3 DAYS Performed at [facility], [address], [place], [place] [address]    Report Status PENDING  Incomplete  Urine Culture     Status: Abnormal   Collection Time: 01/10/19  7:09 PM  Result Value Ref Range Status   Specimen Description   Final    URINE, RANDOM Performed at [facility], [address] [place], [place] [address]    Special Requests   Final    NONE Performed at [facility], [address] [place], [place] [address]    Culture (A)  Final    <10,000 COLONIES/mL INSIGNIFICANT GROWTH Performed at [facility] [address], [place], [place] [address]    Report Status 01/12/2019 FINAL  Final  C difficile quick scan w PCR reflex     Status: None   Collection Time: 01/11/19 10:08 AM  Result Value Ref Range Status   C Diff antigen NEGATIVE NEGATIVE Final   C Diff  toxin NEGATIVE NEGATIVE Final   C Diff interpretation No C. difficile detected.  Final    Comment: Performed at [facility], [address] [place], [place] [address]  Gastrointestinal Panel by PCR , Stool     Status: None   Collection Time: 01/11/19 10:08 AM  Result Value Ref Range Status   Campylobacter species NOT DETECTED NOT DETECTED Final   Plesimonas shigelloides NOT DETECTED NOT DETECTED Final   Salmonella species NOT DETECTED NOT DETECTED Final   Yersinia enterocolitica NOT DETECTED NOT DETECTED Final   Vibrio species NOT DETECTED NOT DETECTED Final   Vibrio cholerae NOT DETECTED NOT DETECTED Final   Enteroaggregative E coli (EAEC) NOT DETECTED NOT DETECTED Final   Enteropathogenic E coli (EPEC) NOT DETECTED NOT DETECTED Final   Enterotoxigenic E coli (ETEC) NOT DETECTED NOT  DETECTED Final   Shiga like toxin producing E coli (STEC) NOT DETECTED NOT DETECTED Final   Shigella/Enteroinvasive E coli (EIEC) NOT DETECTED NOT DETECTED Final   Cryptosporidium NOT DETECTED NOT DETECTED Final   Cyclospora cayetanensis NOT DETECTED NOT DETECTED Final   Entamoeba histolytica NOT DETECTED NOT DETECTED Final   Giardia lamblia NOT DETECTED NOT DETECTED Final   Adenovirus F40/41 NOT DETECTED NOT DETECTED Final   Astrovirus NOT DETECTED NOT DETECTED Final   Norovirus GI/GII NOT DETECTED NOT DETECTED Final   Rotavirus A NOT DETECTED NOT DETECTED Final   Sapovirus (I, II, IV, and V) NOT DETECTED NOT DETECTED Final    Comment: Performed at [facility], [address] [place], [place] [address]   Studies/Results: No results found. Medications: I have reviewed the patient's current medications. Scheduled Meds: . enoxaparin (LOVENOX) injection  40 mg Subcutaneous Q24H  . mercaptopurine  50 mg Oral Daily  . methylPREDNISolone (SOLU-MEDROL) injection  60 mg Intravenous Daily   Continuous Infusions: . sodium chloride 125 mL/hr at 01/13/19 0717  . sodium chloride Stopped (01/12/19 1505)  . piperacillin-tazobactam (ZOSYN)  IV 3.375 g (01/13/19 1208)   PRN Meds:.sodium chloride, acetaminophen **OR** acetaminophen, ALPRAZolam, bisacodyl, morphine injection **OR** morphine injection, ondansetron **OR** ondansetron (ZOFRAN) IV, ondansetron (ZOFRAN) IV, senna-docusate, traZODone   Assessment: Active Problems:   Colitis   Ulcerative colitis with complication ([place])    Plan: This patient came in with an exacerbation of ulcerative colitis.  The patient is now having more frequent bowel movements but states that her abdominal pain is better.  The patient did have some nausea overnight but is feeling well at the present time.  The patient has been told that it may take time for the inflammation to go down in her colon and for her to feel better.  She also may be having some  side effects related to the antibiotics such as the increasing bowel movements and the nausea.  The patient has been explained the plan and agrees with it.   LOS: 3 days   Natasha Mueller 01/13/2019, 2:02 PM

## 2019-01-14 LAB — BASIC METABOLIC PANEL
ANION GAP: 7 (ref 5–15)
BUN: 8 mg/dL (ref 6–20)
CO2: 23 mmol/L (ref 22–32)
Calcium: 8 mg/dL — ABNORMAL LOW (ref 8.9–10.3)
Chloride: 115 mmol/L — ABNORMAL HIGH (ref 98–111)
Creatinine, Ser: 0.7 mg/dL (ref 0.44–1.00)
GFR calc Af Amer: >60 mL/min (ref >60)
GFR calc non Af Amer: >60 mL/min (ref >60)
Glucose, Bld: 115 mg/dL — ABNORMAL HIGH (ref 70–99)
Potassium: 3.3 mmol/L — ABNORMAL LOW (ref 3.5–5.1)
SODIUM: 145 mmol/L (ref 135–145)

## 2019-01-14 LAB — C-REACTIVE PROTEIN: CRP: 8.1 mg/dL — AB (ref <1.0)

## 2019-01-14 LAB — MAGNESIUM: Magnesium: 1.9 mg/dL (ref 1.7–2.4)

## 2019-01-14 MED ORDER — SACCHAROMYCES BOULARDII 250 MG PO CAPS
250.0000 mg | ORAL_CAPSULE | Freq: Two times a day (BID) | ORAL | Status: DC
  Filled 2019-01-14: qty 1

## 2019-01-14 MED ORDER — POTASSIUM CHLORIDE 10 MEQ/100ML IV SOLN
10.0000 meq | INTRAVENOUS | Status: AC
  Administered 2019-01-14 (×4): 10 meq via INTRAVENOUS
  Filled 2019-01-14: qty 100

## 2019-01-14 MED ORDER — RISAQUAD PO CAPS
1.0000 | ORAL_CAPSULE | Freq: Two times a day (BID) | ORAL | Status: DC
  Administered 2019-01-14 – 2019-01-17 (×7): 1 via ORAL
  Filled 2019-01-14 (×7): qty 1

## 2019-01-14 NOTE — Progress Notes (Signed)
[place] [facility] [place] NAME: Any Mcneice    MR#:  [id number]  DATE OF BIRTH:  Oct 25, 1966  SUBJECTIVE:   Patient with more loose stools  no blood in BM Wants to stop ABX  REVIEW OF SYSTEMS:    Review of Systems  Constitutional: ++ fever,no chills weight loss HENT: Negative for ear pain, nosebleeds, congestion, facial swelling, rhinorrhea, neck pain, neck stiffness and ear discharge.   Respiratory: Negative for cough, shortness of breath, wheezing  Cardiovascular: Negative for chest pain, palpitations and leg swelling.  Gastrointestinal: Negative for heartburn, ++ abdominal pain/nausea NOvomiting, + diarrhea no consitpation Genitourinary: Negative for dysuria, urgency, frequency, hematuria Musculoskeletal: Negative for back pain or joint pain Neurological: Negative for dizziness, seizures, syncope, focal weakness,  numbness and headaches.  Hematological: Does not bruise/bleed easily.  Psychiatric/Behavioral: Negative for hallucinations, confusion, dysphoric mood    Tolerating Diet: yes      DRUG ALLERGIES:   Allergies  Allergen Reactions  . Hyoscyamine Sulfate Swelling    Tongue swelling    VITALS:  Blood pressure 104/61, pulse (!) 56, temperature 98.2 F (36.8 C), temperature source Oral, resp. rate 18, height 5' 4"  (1.626 m), weight 59.4 kg, last menstrual period 06/14/2016, SpO2 98 %.  PHYSICAL EXAMINATION:  Constitutional: Appears well-developed and well-nourished. No distress. HENT: Normocephalic. Marland Kitchen Oropharynx is clear and moist.  Eyes: Conjunctivae and EOM are normal. PERRLA, no scleral icterus.  Neck: Normal ROM. Neck supple. No JVD. No tracheal deviation. CVS: RRR, S1/S2 +, no murmurs, no gallops, no carotid bruit.  Pulmonary: Effort and breath sounds normal, no stridor, rhonchi, wheezes, rales.  Abdominal: Soft. BS +,  no distension, tenderness, rebound or guarding.  Musculoskeletal: Normal range of motion. No edema  and no tenderness.  Neuro: Alert. CN 2-12 grossly intact. No focal deficits. Skin: Skin is warm and dry. No rash noted. Psychiatric: Normal mood and affect.      LABORATORY PANEL:   CBC Recent Labs  Lab 01/13/19 0518  WBC 9.9  HGB 8.8*  HCT 28.4*  PLT 272   ------------------------------------------------------------------------------------------------------------------  Chemistries  Recent Labs  Lab 01/10/19 1909  01/14/19 0444  NA 137   < > 145  K 3.1*   < > 3.3*  CL 106   < > 115*  CO2 22   < > 23  GLUCOSE 119*   < > 115*  BUN 11   < > 8  CREATININE 0.54   < > 0.70  CALCIUM 8.3*   < > 8.0*  MG 2.0  --  1.9  AST 27  --   --   ALT 39  --   --   ALKPHOS 58  --   --   BILITOT 0.5  --   --    < > = values in this interval not displayed.   ------------------------------------------------------------------------------------------------------------------  Cardiac Enzymes No results for input(s): TROPONINI in the last 168 hours. ------------------------------------------------------------------------------------------------------------------  RADIOLOGY:  No results found.   ASSESSMENT AND PLAN:   53 year old female with history of ulcerative colitis who presents with fever and abdominal pain.  1.  Sepsis: Patient presented with fever and tachycardia. Sepsis is due to colitis. We will stop Zosyn as per her wishes   2.  Ulcerative colitis: Continue IV steroids as per GI.   Continue mercaptopurine Needs colonoscopy but unable to take prep.  GI is recommending outpatient colonoscopy unless symptoms worsen. CRP pending this am If the CRP is not improving  the patient has been told that she may need to be considered for inpatient Biologics  3.  Hypokalemia: Replete PRN   4  Acute on chronic anemia: Hemoglobin is stable.     5.  Anxiety: Xanax as needed    Management plans discussed with the patient and daughter she is in agreement.  CODE STATUS:  full  TOTAL TIME TAKING CARE OF THIS PATIENT: 24 minutes.     POSSIBLE D/C 1-2 days, DEPENDING ON CLINICAL CONDITION.   [name] M.D on 01/14/2019 at 11:28 AM  Between 7am to 6pm - Pager - [phone] After 6pm go to [email or web address] - password [email or web address] [facility]  Office  [phone]  CC: Primary care physician; Trinna Post, PA-C  Note: This dictation was prepared with Dragon dictation along with smaller phrase technology. Any transcriptional errors that result from this process are unintentional.

## 2019-01-14 NOTE — Progress Notes (Signed)
Natasha Lame, MD [facility]   [address] [place], [place] [address] Phone: [phone] Fax : [phone]   Subjective: The patient reports that she had a good night yesterday without any nausea vomiting.  She still has diarrhea but states her abdominal pain is better today.  She is still not able to eat a lot of food but has been drinking Ensure.   Objective: Vital signs in last 24 hours: Vitals:   01/13/19 1150 01/13/19 2025 01/13/19 2148 01/14/19 0520  BP: 112/65 (!) 104/55  104/61  Pulse: (!) 56 (!) 49  (!) 56  Resp:  17  18  Temp: 98.6 F (37 C)  98.5 F (36.9 C) 98.2 F (36.8 C)  TempSrc: Oral  Oral Oral  SpO2: 99% 97%  98%  Weight:      Height:       Weight change:   Intake/Output Summary (Last 24 hours) at 01/14/2019 0950 Last data filed at 01/14/2019 0700 Gross per 24 hour  Intake 2912.45 ml  Output 625 ml  Net 2287.45 ml     Exam: Heart:: Regular rate and rhythm, S1S2 present or without murmur or extra heart sounds Lungs: normal and clear to auscultation and percussion Abdomen: soft, nontender, normal bowel sounds   Lab Results: @LABTEST2 @ Micro Results: Recent Results (from the past 240 hour(s))  Blood Culture (routine x 2)     Status: None (Preliminary result)   Collection Time: 01/10/19  7:09 PM  Result Value Ref Range Status   Specimen Description BLOOD RIGHT HAND  Final   Special Requests   Final    BOTTLES DRAWN AEROBIC AND ANAEROBIC Blood Culture adequate volume   Culture   Final    NO GROWTH 4 DAYS Performed at [facility], [address] [place], [place] [address]    Report Status PENDING  Incomplete  Blood Culture (routine x 2)     Status: None (Preliminary result)   Collection Time: 01/10/19  7:09 PM  Result Value Ref Range Status   Specimen Description BLOOD LEFT ANTECUBITAL  Final   Special Requests   Final    BOTTLES DRAWN AEROBIC AND ANAEROBIC Blood Culture adequate volume   Culture   Final    NO GROWTH 4 DAYS  Performed at [facility], [address], [place], [place] [address]    Report Status PENDING  Incomplete  Urine Culture     Status: Abnormal   Collection Time: 01/10/19  7:09 PM  Result Value Ref Range Status   Specimen Description   Final    URINE, RANDOM Performed at [facility], [address] [place], [place] [address]    Special Requests   Final    NONE Performed at [facility], [address], [place], [place] [address]    Culture (A)  Final    <10,000 COLONIES/mL INSIGNIFICANT GROWTH Performed at [facility] [address] [place], [place] [address]    Report Status 01/12/2019 FINAL  Final  C difficile quick scan w PCR reflex     Status: None   Collection Time: 01/11/19 10:08 AM  Result Value Ref Range Status   C Diff antigen NEGATIVE NEGATIVE Final   C Diff toxin NEGATIVE NEGATIVE Final   C Diff interpretation No C. difficile detected.  Final    Comment: Performed at [facility], [address] [place], [place] [address]  Gastrointestinal Panel by PCR , Stool     Status: None  Collection Time: 01/11/19 10:08 AM  Result Value Ref Range Status   Campylobacter species NOT DETECTED NOT DETECTED Final   Plesimonas shigelloides NOT DETECTED NOT DETECTED Final   Salmonella species NOT DETECTED NOT DETECTED Final   Yersinia enterocolitica NOT DETECTED NOT DETECTED Final   Vibrio species NOT DETECTED NOT DETECTED Final   Vibrio cholerae NOT DETECTED NOT DETECTED Final   Enteroaggregative E coli (EAEC) NOT DETECTED NOT DETECTED Final   Enteropathogenic E coli (EPEC) NOT DETECTED NOT DETECTED Final   Enterotoxigenic E coli (ETEC) NOT DETECTED NOT DETECTED Final   Shiga like toxin producing E coli (STEC) NOT DETECTED NOT DETECTED Final   Shigella/Enteroinvasive E coli (EIEC) NOT DETECTED NOT DETECTED Final   Cryptosporidium NOT DETECTED NOT DETECTED Final   Cyclospora cayetanensis NOT DETECTED NOT DETECTED Final    Entamoeba histolytica NOT DETECTED NOT DETECTED Final   Giardia lamblia NOT DETECTED NOT DETECTED Final   Adenovirus F40/41 NOT DETECTED NOT DETECTED Final   Astrovirus NOT DETECTED NOT DETECTED Final   Norovirus GI/GII NOT DETECTED NOT DETECTED Final   Rotavirus A NOT DETECTED NOT DETECTED Final   Sapovirus (I, II, IV, and V) NOT DETECTED NOT DETECTED Final    Comment: Performed at [facility], [address] [place], [place] [address]   Studies/Results: No results found. Medications: I have reviewed the patient's current medications. Scheduled Meds: . acidophilus  1 capsule Oral BID  . enoxaparin (LOVENOX) injection  40 mg Subcutaneous Q24H  . mercaptopurine  50 mg Oral Daily  . methylPREDNISolone (SOLU-MEDROL) injection  60 mg Intravenous Daily   Continuous Infusions: . sodium chloride 125 mL/hr at 01/14/19 0729  . sodium chloride Stopped (01/12/19 1505)  . piperacillin-tazobactam (ZOSYN)  IV 3.375 g (01/14/19 0411)  . potassium chloride 10 mEq (01/14/19 0824)   PRN Meds:.sodium chloride, acetaminophen **OR** acetaminophen, ALPRAZolam, bisacodyl, morphine injection **OR** morphine injection, ondansetron **OR** ondansetron (ZOFRAN) IV, ondansetron (ZOFRAN) IV, senna-docusate, traZODone   Assessment: Active Problems:   Colitis   Ulcerative colitis with complication ([place])    Plan: This patient has also colitis that showed inflammation on her CT scan.  The patient has been treated with her 6-MP and IV steroids with some improvement.  The patient has a CRP pending for today.  If the CRP is not improving the patient has been told that she may need to be considered for inpatient Biologics.  The patient is hesitant to do this and also reports that she is unlikely to be able to handle any sort of prep for colonoscopy also.   LOS: 4 days   Natasha Mueller 01/14/2019, 9:50 AM

## 2019-01-15 ENCOUNTER — Inpatient Hospital Stay: Payer: BLUE CROSS/BLUE SHIELD | Admitting: Anesthesiology

## 2019-01-15 ENCOUNTER — Encounter
Admission: EM | Disposition: A | Discharge status: Home / Self Care | Payer: Self-pay | Source: Home / Self Care | Attending: Internal Medicine

## 2019-01-15 DIAGNOSIS — Z8719 Personal history of other diseases of the digestive system: Secondary | ICD-10-CM

## 2019-01-15 DIAGNOSIS — K529 Noninfective gastroenteritis and colitis, unspecified: Secondary | ICD-10-CM

## 2019-01-15 HISTORY — PX: COLONOSCOPY WITH PROPOFOL: SHX5780

## 2019-01-15 LAB — CULTURE, BLOOD (ROUTINE X 2)
Culture: NO GROWTH
Culture: NO GROWTH
SPECIAL REQUESTS: ADEQUATE
Special Requests: ADEQUATE

## 2019-01-15 LAB — BASIC METABOLIC PANEL
Anion gap: 7 (ref 5–15)
BUN: 6 mg/dL (ref 6–20)
CALCIUM: 7.8 mg/dL — AB (ref 8.9–10.3)
CO2: 23 mmol/L (ref 22–32)
CREATININE: 0.47 mg/dL (ref 0.44–1.00)
Chloride: 112 mmol/L — ABNORMAL HIGH (ref 98–111)
GFR calc non Af Amer: >60 mL/min (ref >60)
Glucose, Bld: 96 mg/dL (ref 70–99)
Potassium: 3 mmol/L — ABNORMAL LOW (ref 3.5–5.1)
SODIUM: 142 mmol/L (ref 135–145)

## 2019-01-15 SURGERY — COLONOSCOPY WITH PROPOFOL
Anesthesia: General

## 2019-01-15 MED ORDER — POTASSIUM CHLORIDE 10 MEQ/100ML IV SOLN
10.0000 meq | INTRAVENOUS | Status: AC
  Administered 2019-01-15: 10 meq via INTRAVENOUS
  Filled 2019-01-15: qty 100

## 2019-01-15 MED ORDER — POTASSIUM CHLORIDE 10 MEQ/100ML IV SOLN
10.0000 meq | INTRAVENOUS | Status: AC
  Administered 2019-01-15 (×3): 10 meq via INTRAVENOUS
  Filled 2019-01-15 (×3): qty 100

## 2019-01-15 MED ORDER — PROPOFOL 500 MG/50ML IV EMUL
INTRAVENOUS | Status: DC | PRN
  Administered 2019-01-15: 140 ug/kg/min via INTRAVENOUS

## 2019-01-15 MED ORDER — LORAZEPAM 2 MG/ML IJ SOLN
1.0000 mg | Freq: Four times a day (QID) | INTRAMUSCULAR | Status: DC | PRN
  Administered 2019-01-15 (×2): 1 mg via INTRAVENOUS
  Filled 2019-01-15 (×3): qty 1

## 2019-01-15 MED ORDER — PROPOFOL 10 MG/ML IV BOLUS
INTRAVENOUS | Status: DC | PRN
  Administered 2019-01-15: 70 mg via INTRAVENOUS

## 2019-01-15 NOTE — Anesthesia Preprocedure Evaluation (Addendum)
Anesthesia Evaluation  Patient identified by MRN, date of birth, ID band Patient awake    Reviewed: Allergy & Precautions, H&P , NPO status , Patient's Chart, lab work & pertinent test results, reviewed documented beta blocker date and time   Airway Mallampati: II   Neck ROM: full    Dental  (+) Poor Dentition   Pulmonary neg pulmonary ROS,    Pulmonary exam normal        Cardiovascular Exercise Tolerance: Good negative cardio ROS Normal cardiovascular exam Rhythm:regular Rate:Normal     Neuro/Psych negative neurological ROS  negative psych ROS   GI/Hepatic negative GI ROS, Neg liver ROS, PUD, Ulcerative colitis with exacerbation   Endo/Other  negative endocrine ROS  Renal/GU negative Renal ROS  negative genitourinary   Musculoskeletal   Abdominal   Peds  Hematology negative hematology ROS (+)   Anesthesia Other Findings Pt presenting with colitis from UC exacerbation.  +abd pain, +nausea, no vomiting.  Past Medical History: No date: Allergy 2013: BRCA negative     Comment:  with BART No date: Family history of breast cancer     Comment:  BRCA/Bart neg 2013; IBIS=15% No date: UC (ulcerative colitis) ([place])  Past Surgical History: No date: CESAREAN SECTION  BMI    Body Mass Index:  22.47 kg/m      Reproductive/Obstetrics negative OB ROS                            Anesthesia Physical Anesthesia Plan  ASA: II  Anesthesia Plan: General   Post-op Pain Management:    Induction:   PONV Risk Score and Plan: Propofol infusion and TIVA  Airway Management Planned: Nasal Cannula  Additional Equipment:   Intra-op Plan:   Post-operative Plan:   Informed Consent: I have reviewed the patients History and Physical, chart, labs and discussed the procedure including the risks, benefits and alternatives for the proposed anesthesia with the patient or authorized representative who  has indicated his/her understanding and acceptance.     Dental Advisory Given  Plan Discussed with: Anesthesiologist and CRNA  Anesthesia Plan Comments:        Anesthesia Quick Evaluation

## 2019-01-15 NOTE — Transfer of Care (Signed)
Immediate Anesthesia Transfer of Care Note  Patient: Natasha Mueller  Procedure(s) Performed: COLONOSCOPY WITH PROPOFOL (N/A )  Patient Location: PACU  Anesthesia Type:General  Level of Consciousness: sedated  Airway & Oxygen Therapy: Patient Spontanous Breathing and Patient connected to nasal cannula oxygen  Post-op Assessment: Report given to RN and Post -op Vital signs reviewed and stable  Post vital signs: Reviewed and stable  Last Vitals:  Vitals Value Taken Time  BP 109/75 01/15/2019  4:05 PM  Temp 36.3 C 01/15/2019  4:05 PM  Pulse 87 01/15/2019  4:05 PM  Resp 23 01/15/2019  4:05 PM  SpO2 94 % 01/15/2019  4:05 PM  Vitals shown include unvalidated device data.  Last Pain:  Vitals:   01/15/19 1604  TempSrc: (P) Tympanic  PainSc:       Patients Stated Pain Goal: 0 (90/24/09 7353)  Complications: No apparent anesthesia complications

## 2019-01-15 NOTE — Anesthesia Post-op Follow-up Note (Signed)
Anesthesia QCDR form completed.        

## 2019-01-15 NOTE — Progress Notes (Signed)
[place] [facility] [place] NAME: Natasha Mueller    MR#:  [id number]  DATE OF BIRTH:  30-Apr-1966  SUBJECTIVE:   Patient still with loose stools this am almost every hour Cramping at night Frustrated and anxious   REVIEW OF SYSTEMS:    Review of Systems  Constitutional: ++ fever,no chills weight loss HENT: Negative for ear pain, nosebleeds, congestion, facial swelling, rhinorrhea, neck pain, neck stiffness and ear discharge.   Respiratory: Negative for cough, shortness of breath, wheezing  Cardiovascular: Negative for chest pain, palpitations and leg swelling.  Gastrointestinal: Negative for heartburn, ++ abdominal pain/nausea NO vomiting, + diarrhea no consitpation Genitourinary: Negative for dysuria, urgency, frequency, hematuria Musculoskeletal: Negative for back pain or joint pain Neurological: Negative for dizziness, seizures, syncope, focal weakness,  numbness and headaches.  Hematological: Does not bruise/bleed easily.  Psychiatric/Behavioral: Negative for hallucinations, confusion, dysphoric mood +anxious    Tolerating Diet: no     DRUG ALLERGIES:   Allergies  Allergen Reactions  . Hyoscyamine Sulfate Swelling    Tongue swelling    VITALS:  Blood pressure 120/63, pulse 79, temperature 98.7 F (37.1 C), temperature source Oral, resp. rate 18, height 5' 4"  (1.626 m), weight 59.4 kg, last menstrual period 06/14/2016, SpO2 96 %.  PHYSICAL EXAMINATION:  Constitutional: Appears well-developed and well-nourished. No distress. HENT: Normocephalic. Marland Kitchen Oropharynx is clear and moist.  Eyes: Conjunctivae and EOM are normal. PERRLA, no scleral icterus.  Neck: Normal ROM. Neck supple. No JVD. No tracheal deviation. CVS: RRR, S1/S2 +, no murmurs, no gallops, no carotid bruit.  Pulmonary: Effort and breath sounds normal, no stridor, rhonchi, wheezes, rales.  Abdominal: Soft. BS +,  no distension, tenderness, rebound or guarding.   Musculoskeletal: Normal range of motion. No edema and no tenderness.  Neuro: Alert. CN 2-12 grossly intact. No focal deficits. Skin: Skin is warm and dry. No rash noted. Psychiatric: Normal mood and affect.      LABORATORY PANEL:   CBC Recent Labs  Lab 01/13/19 0518  WBC 9.9  HGB 8.8*  HCT 28.4*  PLT 272   ------------------------------------------------------------------------------------------------------------------  Chemistries  Recent Labs  Lab 01/10/19 1909  01/14/19 0444 01/15/19 0432  NA 137   < > 145 142  K 3.1*   < > 3.3* 3.0*  CL 106   < > 115* 112*  CO2 22   < > 23 23  GLUCOSE 119*   < > 115* 96  BUN 11   < > 8 6  CREATININE 0.54   < > 0.70 0.47  CALCIUM 8.3*   < > 8.0* 7.8*  MG 2.0  --  1.9  --   AST 27  --   --   --   ALT 39  --   --   --   ALKPHOS 58  --   --   --   BILITOT 0.5  --   --   --    < > = values in this interval not displayed.   ------------------------------------------------------------------------------------------------------------------  Cardiac Enzymes No results for input(s): TROPONINI in the last 168 hours. ------------------------------------------------------------------------------------------------------------------  RADIOLOGY:  No results found.   ASSESSMENT AND PLAN:   53 year old female with history of ulcerative colitis who presents with fever and abdominal pain.  1.  Sepsis: Patient presented with fever and tachycardia. Sepsis is due to colitis. Stopped Zosyn for now.   2.  Ulcerative colitis flare: Continue IV steroids as per GI.   Plan for colonoscopy  this am She will need to start biologics this admit more then likely   3.  Hypokalemia: Replete and recheck in am Mg wnl   4  Acute on chronic anemia: Hemoglobin is stable.     5.  Anxiety: Xanax/Ativan as needed   D/w dr Allen Norris   Management plans discussed with the patient and daughter she is in agreement.  CODE STATUS: full  TOTAL TIME  TAKING CARE OF THIS PATIENT: 30 minutes.     POSSIBLE D/C 1-2 days, DEPENDING ON CLINICAL CONDITION.   [name] M.D on 01/15/2019 at 10:18 AM  Between 7am to 6pm - Pager - [phone] After 6pm go to [email or web address] - password [email or web address] [facility]  Office  [phone]  CC: Primary care physician; Trinna Post, PA-C  Note: This dictation was prepared with Dragon dictation along with smaller phrase technology. Any transcriptional errors that result from this process are unintentional.

## 2019-01-15 NOTE — Care Management (Signed)
Notified by department director that patient had financial concerns regarding a new medication she potentially will be started on.   RNCM attempted to meet with patient, however she is currently off the flor for a procedure

## 2019-01-15 NOTE — Anesthesia Postprocedure Evaluation (Signed)
Anesthesia Post Note  Patient: Natasha Mueller  Procedure(s) Performed: COLONOSCOPY WITH PROPOFOL (N/A )  Patient location during evaluation: PACU Anesthesia Type: General Level of consciousness: awake and alert Pain management: pain level controlled Vital Signs Assessment: post-procedure vital signs reviewed and stable Respiratory status: spontaneous breathing, nonlabored ventilation, respiratory function stable and patient connected to nasal cannula oxygen Cardiovascular status: blood pressure returned to baseline and stable Postop Assessment: no apparent nausea or vomiting Anesthetic complications: no     Last Vitals:  Vitals:   01/15/19 1604 01/15/19 1605  BP: 109/75 109/75  Pulse: 90 89  Resp: (!) 25 (!) 26  Temp: 36.8 C (!) 36.3 C  SpO2: 92% 93%    Last Pain:  Vitals:   01/15/19 1604  TempSrc: Tympanic  PainSc: [place]

## 2019-01-15 NOTE — Op Note (Signed)
[facility] Gastroenterology Patient Name: Natasha Mueller Procedure Date: 01/15/2019 3:29 PM MRN: [id number] Account #: [id number] Date of Birth: 1966-07-12 Admit Type: Inpatient Age: 53 Room: [facility] ENDO ROOM 3 Gender: Female Note Status: Finalized Procedure:            Colonoscopy Indications:          Personal history of ulcerative colitis Providers:            Lucilla Lame MD, MD Referring MD:         Wendee Beavers. Terrilee Croak (Referring MD) Medicines:            Propofol per Anesthesia Complications:        No immediate complications. Procedure:            Pre-Anesthesia Assessment:                       - Prior to the procedure, a History and Physical was                        performed, and patient medications and allergies were                        reviewed. The patient's tolerance of previous                        anesthesia was also reviewed. The risks and benefits of                        the procedure and the sedation options and risks were                        discussed with the patient. All questions were                        answered, and informed consent was obtained. Prior                        Anticoagulants: The patient has taken no previous                        anticoagulant or antiplatelet agents. ASA Grade                        Assessment: II - A patient with mild systemic disease.                        After reviewing the risks and benefits, the patient was                        deemed in satisfactory condition to undergo the                        procedure.                       After obtaining informed consent, the colonoscope was                        passed under direct vision. Throughout the procedure,  the patient's blood pressure, pulse, and oxygen                        saturations were monitored continuously. The                        Colonoscope was introduced through the anus and   advanced to the the transverse colon for evaluation.                        This was the intended extent. The colonoscopy was                        performed without difficulty. The patient tolerated the                        procedure well. The quality of the bowel preparation                        was excellent. Findings:      The perianal and digital rectal examinations were normal.      Diffuse moderate inflammation characterized by aphthous ulcerations was       found in the transverse colon. Biopsies were taken with a cold forceps       for histology.      Diffuse mild inflammation characterized by aphthous ulcerations was       found in the descending colon.      The sigmoid colon appeared normal. Biopsies were taken with a cold       forceps for histology.      Diffuse mild inflammation characterized by erythema was found in the       rectum. Biopsies were taken with a cold forceps for histology.      Multiple small-mouthed diverticula were found in the sigmoid colon,       descending colon and transverse colon. Impression:           - Diffuse moderate inflammation was found in the                        transverse colon secondary to colitis. Biopsied.                       - Diffuse mild inflammation was found in the descending                        colon secondary to colitis.                       - The sigmoid colon is normal. Biopsied.                       - Diffuse mild inflammation was found in the rectum                        secondary to colitis. Biopsied.                       - Diverticulosis in the sigmoid colon, in the                        descending colon and in  the transverse colon. Recommendation:       - Return patient to hospital ward for ongoing care.                       - Resume previous diet.                       - Continue present medications.                       - Await pathology results. Procedure Code(s):    --- Professional ---                        [phone], 58, Colonoscopy, flexible; with biopsy, single                        or multiple Diagnosis Code(s):    --- Professional ---                       Z87.19, Personal history of other diseases of the                        digestive system                       K52.9, Noninfective gastroenteritis and colitis,                        unspecified CPT copyright 2018 American Medical Association. All rights reserved. The codes documented in this report are preliminary and upon coder review may  be revised to meet current compliance requirements. Lucilla Lame MD, MD 01/15/2019 4:02:49 PM This report has been signed electronically. Number of Addenda: 0 Note Initiated On: 01/15/2019 3:29 PM Total Procedure Duration: 0 hours 12 minutes 55 seconds       [facility]

## 2019-01-15 NOTE — Progress Notes (Signed)
Primary nurse noted that pt had some slight  edema in arms  and legs. Primary nurse noted that pt had NS @125 . Primary nurse paged and spoke to Dr. Jannifer Franklin. Orders received to Discontinue

## 2019-01-16 LAB — CBC WITH DIFFERENTIAL/PLATELET
Abs Immature Granulocytes: 0.08 10*3/uL — ABNORMAL HIGH (ref 0.00–0.07)
BASOS PCT: 1 %
Basophils Absolute: 0.1 10*3/uL (ref 0.0–0.1)
Eosinophils Absolute: 0.2 10*3/uL (ref 0.0–0.5)
Eosinophils Relative: 3 %
HCT: 28.7 % — ABNORMAL LOW (ref 36.0–46.0)
Hemoglobin: 9.1 g/dL — ABNORMAL LOW (ref 12.0–15.0)
Immature Granulocytes: 1 %
Lymphocytes Relative: 17 %
Lymphs Abs: 1.5 10*3/uL (ref 0.7–4.0)
MCH: 30.3 pg (ref 26.0–34.0)
MCHC: 31.7 g/dL (ref 30.0–36.0)
MCV: 95.7 fL (ref 80.0–100.0)
Monocytes Absolute: 0.5 10*3/uL (ref 0.1–1.0)
Monocytes Relative: 6 %
Neutro Abs: 6.4 10*3/uL (ref 1.7–7.7)
Neutrophils Relative %: 72 %
Platelets: 515 10*3/uL — ABNORMAL HIGH (ref 150–400)
RBC: 3 MIL/uL — ABNORMAL LOW (ref 3.87–5.11)
RDW: 12.6 % (ref 11.5–15.5)
WBC: 8.7 10*3/uL (ref 4.0–10.5)
nRBC: 0 % (ref 0.0–0.2)

## 2019-01-16 LAB — BASIC METABOLIC PANEL
Anion gap: 7 (ref 5–15)
BUN: 8 mg/dL (ref 6–20)
CO2: 25 mmol/L (ref 22–32)
Calcium: 8 mg/dL — ABNORMAL LOW (ref 8.9–10.3)
Chloride: 109 mmol/L (ref 98–111)
Creatinine, Ser: 0.63 mg/dL (ref 0.44–1.00)
GFR calc Af Amer: >60 mL/min (ref >60)
GFR calc non Af Amer: >60 mL/min (ref >60)
Glucose, Bld: 109 mg/dL — ABNORMAL HIGH (ref 70–99)
POTASSIUM: 3.2 mmol/L — AB (ref 3.5–5.1)
Sodium: 141 mmol/L (ref 135–145)

## 2019-01-16 LAB — MAGNESIUM: Magnesium: 2.1 mg/dL (ref 1.7–2.4)

## 2019-01-16 MED ORDER — POTASSIUM CHLORIDE 10 MEQ/100ML IV SOLN
10.0000 meq | INTRAVENOUS | Status: AC
  Administered 2019-01-16 (×4): 10 meq via INTRAVENOUS
  Filled 2019-01-16 (×4): qty 100

## 2019-01-16 NOTE — Progress Notes (Signed)
[name] [facility] [place] NAME: Natasha Mueller    MR#:  [id number]  DATE OF BIRTH:  1965/12/25  SUBJECTIVE:   The patient feels better, she has better abdominal pain and diarrhea. REVIEW OF SYSTEMS:    Review of Systems  Constitutional: No fever,no chills weight loss HENT: Negative for ear pain, nosebleeds, congestion, facial swelling, rhinorrhea, neck pain, neck stiffness and ear discharge.   Respiratory: Negative for cough, shortness of breath, wheezing  Cardiovascular: Negative for chest pain, palpitations and leg swelling.  Gastrointestinal: Negative for heartburn, mild abdominal pain and diarrhea. Genitourinary: Negative for dysuria, urgency, frequency, hematuria Musculoskeletal: Negative for back pain or joint pain Neurological: Negative for dizziness, seizures, syncope, focal weakness,  numbness and headaches.  Hematological: Does not bruise/bleed easily.  Psychiatric/Behavioral: Negative for hallucinations, confusion, dysphoric mood or anxiety. DRUG ALLERGIES:   Allergies  Allergen Reactions  . Hyoscyamine Sulfate Swelling    Tongue swelling    VITALS:  Blood pressure 134/73, pulse 76, temperature 99 F (37.2 C), temperature source Oral, resp. rate 16, height 5' 4"  (1.626 m), weight 59.4 kg, last menstrual period 06/14/2016, SpO2 99 %.  PHYSICAL EXAMINATION:  Constitutional: Appears well-developed and well-nourished. No distress. HENT: Normocephalic. Marland Kitchen Oropharynx is clear and moist.  Eyes: Conjunctivae and EOM are normal. PERRLA, no scleral icterus.  Neck: Normal ROM. Neck supple. No JVD. No tracheal deviation. CVS: RRR, S1/S2 +, no murmurs, no gallops, no carotid bruit.  Pulmonary: Effort and breath sounds normal, no stridor, rhonchi, wheezes, rales.  Abdominal: Soft. BS +,  no distension, tenderness, rebound or guarding.  Musculoskeletal: Normal range of motion. No edema and no tenderness.  Neuro: Alert. CN 2-12 grossly intact.  No focal deficits. Skin: Skin is warm and dry. No rash noted. Psychiatric: Normal mood and affect.  LABORATORY PANEL:   CBC Recent Labs  Lab 01/16/19 0616  WBC 8.7  HGB 9.1*  HCT 28.7*  PLT 515*   ------------------------------------------------------------------------------------------------------------------  Chemistries  Recent Labs  Lab 01/10/19 1909  01/16/19 0606 01/16/19 0903  NA 137   < > 141  --   K 3.1*   < > 3.2*  --   CL 106   < > 109  --   CO2 22   < > 25  --   GLUCOSE 119*   < > 109*  --   BUN 11   < > 8  --   CREATININE 0.54   < > 0.63  --   CALCIUM 8.3*   < > 8.0*  --   MG 2.0   < >  --  2.1  AST 27  --   --   --   ALT 39  --   --   --   ALKPHOS 58  --   --   --   BILITOT 0.5  --   --   --    < > = values in this interval not displayed.   ------------------------------------------------------------------------------------------------------------------  Cardiac Enzymes No results for input(s): TROPONINI in the last 168 hours. ------------------------------------------------------------------------------------------------------------------  RADIOLOGY:  No results found.   ASSESSMENT AND PLAN:   53 year old female with history of ulcerative colitis who presents with fever and abdominal pain.  1.  Sepsis: Patient presented with fever and tachycardia. Sepsis is due to colitis.  Stopped Zosyn.  2.  Ulcerative colitis flare: Continue IV steroids as per GI.   S/p colonoscopy: Diffuse moderate inflammation was found in the transverse colon secondary  to colitis. Biopsied.  3.  Hypokalemia: Replete and recheck in am Mg wnl  4  Acute on chronic anemia: Hemoglobin is stable.    5.  Anxiety: Xanax/Ativan as needed  Management plans discussed with the patient and daughter she is in agreement.  CODE STATUS: full  TOTAL TIME TAKING CARE OF THIS PATIENT: 28 minutes.  POSSIBLE D/C 1-2 days, DEPENDING ON CLINICAL CONDITION.   Demetrios Loll M.D on  01/16/2019 at 12:37 PM  Between 7am to 6pm - Pager - [phone] After 6pm go to [email or web address] - password [email or web address]  Office  [phone]  CC: Primary care physician; Trinna Post, PA-C  Note: This dictation was prepared with Dragon dictation along with smaller phrase technology. Any transcriptional errors that result from this process are unintentional.

## 2019-01-16 NOTE — Progress Notes (Signed)
[facility] Inpatient Progress Note  Subjective: Patient seen for Dr. Allen Norris.  Patient with history of ulcerative colitis seen last year by Tammi Klippel, PA-C of my group for ulcerative colitis which had been in remission until recent weeks.  She was taking 6-MP for maintenance of remission but has not been taking this on the correct daily schedule and appears to admit to taking it haphazardly.   Patient has, however, had increased abdominal pain and diarrhea over the last few days.  Antibiotics were discontinued, and she says she feels much better today without any abdominal pain and markedly decreased frequency of stools.  Her last bowel movement reportedly was this morning and appeared to be "more solid".  Denies diaphoresis or symptoms of fever although she did have a temperature of over 101 when she was admitted to the hospital on 01/10/2019.  Objective: Vital signs in last 24 hours: Temp:  [97.3 F (36.3 C)-99 F (37.2 C)] 99 F (37.2 C) (03/21 0605) Pulse Rate:  [75-90] 76 (03/21 0605) Resp:  [15-26] 16 (03/21 0605) BP: (109-139)/(60-77) 134/73 (03/21 0605) SpO2:  [92 %-100 %] 99 % (03/21 0605) Blood pressure 134/73, pulse 76, temperature 99 F (37.2 C), temperature source Oral, resp. rate 16, height 5' 4"  (1.626 m), weight 59.4 kg, last menstrual period 06/14/2016, SpO2 99 %.    Intake/Output from previous day: 03/20 0701 - 03/21 0700 In: 1212.5 [I.V.:1212.5] Out: 550 [Urine:550]  Intake/Output this shift: No intake/output data recorded.   General appearance: Alert, no distress.  Patient's daughter is at bedside. Resp: Clear to auscultation. Cardio: The rate, normal S1 and S2 heart sounds.  No gallop noted. GI: Soft, nontender without rebound guarding or rigidity.  Bowel sounds are active. Extremities: No edema   Lab Results: Results for orders placed or performed during the hospital encounter of 01/10/19 (from the past 24 hour(s))  Basic metabolic  panel     Status: Abnormal   Collection Time: 01/16/19  6:06 AM  Result Value Ref Range   Sodium 141 135 - 145 mmol/L   Potassium 3.2 (L) 3.5 - 5.1 mmol/L   Chloride 109 98 - 111 mmol/L   CO2 25 22 - 32 mmol/L   Glucose, Bld 109 (H) 70 - 99 mg/dL   BUN 8 6 - 20 mg/dL   Creatinine, Ser 0.63 0.44 - 1.00 mg/dL   Calcium 8.0 (L) 8.9 - 10.3 mg/dL   GFR calc non Af Amer >60 >60 mL/min   GFR calc Af Amer >60 >60 mL/min   Anion gap 7 5 - 15  CBC with Differential/Platelet     Status: Abnormal   Collection Time: 01/16/19  6:16 AM  Result Value Ref Range   WBC 8.7 4.0 - 10.5 K/uL   RBC 3.00 (L) 3.87 - 5.11 MIL/uL   Hemoglobin 9.1 (L) 12.0 - 15.0 g/dL   HCT 28.7 (L) 36.0 - 46.0 %   MCV 95.7 80.0 - 100.0 fL   MCH 30.3 26.0 - 34.0 pg   MCHC 31.7 30.0 - 36.0 g/dL   RDW 12.6 11.5 - 15.5 %   Platelets 515 (H) 150 - 400 K/uL   nRBC 0.0 0.0 - 0.2 %   Neutrophils Relative % 72 %   Neutro Abs 6.4 1.7 - 7.7 K/uL   Lymphocytes Relative 17 %   Lymphs Abs 1.5 0.7 - 4.0 K/uL   Monocytes Relative 6 %   Monocytes Absolute 0.5 0.1 - 1.0 K/uL   Eosinophils Relative 3 %  Eosinophils Absolute 0.2 0.0 - 0.5 K/uL   Basophils Relative 1 %   Basophils Absolute 0.1 0.0 - 0.1 K/uL   Immature Granulocytes 1 %   Abs Immature Granulocytes 0.08 (H) 0.00 - 0.07 K/uL  Magnesium     Status: None   Collection Time: 01/16/19  9:03 AM  Result Value Ref Range   Magnesium 2.1 1.7 - 2.4 mg/dL     Recent Labs    01/16/19 0616  WBC 8.7  HGB 9.1*  HCT 28.7*  PLT 515*   BMET Recent Labs    01/14/19 0444 01/15/19 0432 01/16/19 0606  NA 145 142 141  K 3.3* 3.0* 3.2*  CL 115* 112* 109  CO2 23 23 25   GLUCOSE 115* 96 109*  BUN 8 6 8   CREATININE 0.70 0.47 0.63  CALCIUM 8.0* 7.8* 8.0*   LFT No results for input(s): PROT, ALBUMIN, AST, ALT, ALKPHOS, BILITOT, BILIDIR, IBILI in the last 72 hours. PT/INR No results for input(s): LABPROT, INR in the last 72 hours. Hepatitis Panel No results for input(s):  HEPBSAG, HCVAB, HEPAIGM, HEPBIGM in the last 72 hours. C-Diff No results for input(s): CDIFFTOX in the last 72 hours. No results for input(s): CDIFFPCR in the last 72 hours.   Studies/Results: No results found.  Scheduled Inpatient Medications:   . acidophilus  1 capsule Oral BID  . enoxaparin (LOVENOX) injection  40 mg Subcutaneous Q24H  . mercaptopurine  50 mg Oral Daily  . methylPREDNISolone (SOLU-MEDROL) injection  60 mg Intravenous Daily    Continuous Inpatient Infusions:   . sodium chloride Stopped (01/12/19 1505)  . potassium chloride 10 mEq (01/16/19 1032)    PRN Inpatient Medications:  sodium chloride, acetaminophen **OR** acetaminophen, ALPRAZolam, bisacodyl, LORazepam, morphine injection **OR** morphine injection, ondansetron **OR** ondansetron (ZOFRAN) IV, ondansetron (ZOFRAN) IV, senna-docusate, traZODone  Miscellaneous: QuantiFERON gold was ordered yesterday and is pending.  Chest x-ray on 01/10/2019 showed minor left base atelectasis without adenopathy.  Assessment:  1.  Ulcerative colitis with grossly active colitis noted on colonoscopy performed yesterday, 01/15/2019 by Dr. Allen Norris.  Biopsies are pending.  Patient feels better since that antibiotics have been discontinued and currently says she feels well.  I had a discussion about the patient yesterday with Dr. Allen Norris as well as his partner, Dr. Marius Ditch (this morning) regarding potentially starting on IV infusion of infliximab or Entyvio given her recent exacerbation of symptoms.  There was also discussion of possible transfer to a tertiary care center for expert IBD care, although the patient appears to be clinically well today without any significant abdominal pain, rectal bleeding, leukocytosis, fever.  There appeared to be no dire findings that would warrant transfer to another institution or emergency initiation of IV biologic therapy.  Plan:  1.  Per my discussion with Dr. Marius Ditch, I will order the hepatitis profile and  discussed with pharmacy the availability of IV biologic therapy at this hospital in the case we do begin initiation of therapy. 2.  I will continue to follow the patient closely without initiating any request for transfer to another hospital nor will order IV infliximab at this time. 3.  Hepatitis profile was ordered today.  Chest x-ray has been done and will we will await the QuantiFERON test results. 4.  We will continue to follow.  Teodoro K. Alice Reichert, M.D. 01/16/2019, 1:02 PM

## 2019-01-17 LAB — QUANTIFERON-TB GOLD PLUS: QuantiFERON-TB Gold Plus: NEGATIVE

## 2019-01-17 LAB — BASIC METABOLIC PANEL
Anion gap: 8 (ref 5–15)
BUN: 7 mg/dL (ref 6–20)
CHLORIDE: 105 mmol/L (ref 98–111)
CO2: 27 mmol/L (ref 22–32)
Calcium: 8.4 mg/dL — ABNORMAL LOW (ref 8.9–10.3)
Creatinine, Ser: 0.55 mg/dL (ref 0.44–1.00)
GFR calc Af Amer: >60 mL/min (ref >60)
GFR calc non Af Amer: >60 mL/min (ref >60)
Glucose, Bld: 116 mg/dL — ABNORMAL HIGH (ref 70–99)
Potassium: 2.8 mmol/L — ABNORMAL LOW (ref 3.5–5.1)
SODIUM: 140 mmol/L (ref 135–145)

## 2019-01-17 LAB — MAGNESIUM: Magnesium: 2.1 mg/dL (ref 1.7–2.4)

## 2019-01-17 LAB — QUANTIFERON-TB GOLD PLUS (RQFGPL)
QUANTIFERON TB1 AG VALUE: 0.02 [IU]/mL
QuantiFERON Mitogen Value: 0.78 IU/mL
QuantiFERON Nil Value: 0.02 IU/mL
QuantiFERON TB2 Ag Value: 0.02 IU/mL

## 2019-01-17 LAB — POTASSIUM: Potassium: 3.9 mmol/L (ref 3.5–5.1)

## 2019-01-17 MED ORDER — PREDNISONE 10 MG PO TABS: ORAL_TABLET | ORAL | 0 refills | Status: DC

## 2019-01-17 MED ORDER — POTASSIUM CHLORIDE CRYS ER 20 MEQ PO TBCR
40.0000 meq | EXTENDED_RELEASE_TABLET | ORAL | Status: AC
  Administered 2019-01-17 (×2): 40 meq via ORAL
  Filled 2019-01-17 (×2): qty 2

## 2019-01-17 NOTE — Progress Notes (Addendum)
Natasha Mueller at [place] NAME: Natasha Mueller    MR#:  [id number]  DATE OF BIRTH:  [birth year]  SUBJECTIVE:   The patient has no diarrhea or abdominal pain. REVIEW OF SYSTEMS:    Review of Systems  Constitutional: No fever,no chills weight loss HENT: Negative for ear pain, nosebleeds, congestion, facial swelling, rhinorrhea, neck pain, neck stiffness and ear discharge.   Respiratory: Negative for cough, shortness of breath, wheezing  Cardiovascular: Negative for chest pain, palpitations and leg swelling.  Gastrointestinal: Negative for heartburn, no abdominal pain, nausea, vomiting or diarrhea. Genitourinary: Negative for dysuria, urgency, frequency, hematuria Musculoskeletal: Negative for back pain or joint pain Neurological: Negative for dizziness, seizures, syncope, focal weakness,  numbness and headaches.  Hematological: Does not bruise/bleed easily.  Psychiatric/Behavioral: Negative for hallucinations, confusion, dysphoric mood or anxiety. DRUG ALLERGIES:   Allergies  Allergen Reactions  . Hyoscyamine Sulfate Swelling    Tongue swelling    VITALS:  Blood pressure 120/66, pulse 72, temperature 98.7 F (37.1 C), temperature source Oral, resp. rate 17, height 5' 4"  (1.626 m), weight 59.4 kg, last menstrual period 06/14/2016, SpO2 99 %.  PHYSICAL EXAMINATION:  Constitutional: Appears well-developed and well-nourished. No distress. HENT: Normocephalic. Marland Kitchen Oropharynx is clear and moist.  Eyes: Conjunctivae and EOM are normal. PERRLA, no scleral icterus.  Neck: Normal ROM. Neck supple. No JVD. No tracheal deviation. CVS: RRR, S1/S2 +, no murmurs, no gallops, no carotid bruit.  Pulmonary: Effort and breath sounds normal, no stridor, rhonchi, wheezes, rales.  Abdominal: Soft. BS +,  no distension, diffuse tenderness, no rebound or guarding.  Musculoskeletal: Normal range of motion.  No edema, no tenderness.  Neuro: Alert. CN 2-12 grossly  intact. No focal deficits. Skin: Skin is warm and dry. No rash noted. Psychiatric: Normal mood and affect.  LABORATORY PANEL:   CBC Recent Labs  Lab 01/16/19 0616  WBC 8.7  HGB 9.1*  HCT 28.7*  PLT 515*   ------------------------------------------------------------------------------------------------------------------  Chemistries  Recent Labs  Lab 01/10/19 1909  01/16/19 0903 01/17/19 0716  NA 137   < >  --  140  K 3.1*   < >  --  2.8*  CL 106   < >  --  105  CO2 22   < >  --  27  GLUCOSE 119*   < >  --  116*  BUN 11   < >  --  7  CREATININE 0.54   < >  --  0.55  CALCIUM 8.3*   < >  --  8.4*  MG 2.0   < > 2.1  --   AST 27  --   --   --   ALT 39  --   --   --   ALKPHOS 58  --   --   --   BILITOT 0.5  --   --   --    < > = values in this interval not displayed.   ------------------------------------------------------------------------------------------------------------------  Cardiac Enzymes No results for input(s): TROPONINI in the last 168 hours. ------------------------------------------------------------------------------------------------------------------  RADIOLOGY:  No results found.   ASSESSMENT AND PLAN:   53 year old female with history of ulcerative colitis who presents with fever and abdominal pain.  1.  Sepsis: Patient presented with fever and tachycardia. Sepsis is due to colitis.  Stopped Zosyn.  2.  Ulcerative colitis flare: Continue IV steroids as per GI.   S/p colonoscopy: Diffuse moderate inflammation was found in the transverse colon  secondary to colitis. Biopsied. Continue IV Solu-Medrol.  Follow Dr. Ricky Stabs recommendation.  3.  Hypokalemia: K down to 2.8, Replete and recheck in am Mg wnl  4  Acute on chronic anemia: Hemoglobin is stable.    5.  Anxiety: Xanax/Ativan as needed  Management plans discussed with the patient and daughter she is in agreement.  CODE STATUS: full  TOTAL TIME TAKING CARE OF THIS PATIENT: 28 minutes.   POSSIBLE D/C 1-2 days, DEPENDING ON CLINICAL CONDITION.   Demetrios Loll M.D on 01/17/2019 at 1:22 PM  Between 7am to 6pm - Pager - [phone] After 6pm go to [email or web address] - password [email or web address] [facility]  Office  [phone]  CC: Primary care physician; Trinna Post, PA-C  Note: This dictation was prepared with Dragon dictation along with smaller phrase technology. Any transcriptional errors that result from this process are unintentional.

## 2019-01-17 NOTE — Progress Notes (Signed)
Natasha Mueller at [place] was admitted to the Hospital on 01/10/2019 and possible to be discharged in a few days and should be excused from work/school,  may return to work/school without any restrictions.  Her daughter is here taking care of her and please give her medical leave for 14  days starting 01/10/2019 . Demetrios Loll M.D on 01/17/2019,at 9:23 AM  [facility]  [phone]

## 2019-01-17 NOTE — Progress Notes (Signed)
PHARMACY CONSULT NOTE - FOLLOW UP  Pharmacy Consult for Electrolyte Monitoring and Replacement   Recent Labs: Potassium (mmol/L)  Date Value  01/17/2019 3.9   Magnesium (mg/dL)  Date Value  01/17/2019 2.1   Calcium (mg/dL)  Date Value  01/17/2019 8.4 (L)   Albumin (g/dL)  Date Value  01/10/2019 3.2 (L)  10/19/2018 4.8   Phosphorus (mg/dL)  Date Value  01/10/2019 2.9   Sodium (mmol/L)  Date Value  01/17/2019 140  10/19/2018 139     Assessment: Patient is a 53yo female with h/o ulcerative colitis admitted for increased abdominal pain and diarrhea. Pharmacy consulted for electrolyte replacement.  Patient received KCl 91mq IV x 4 doses on 3/21  Per MD note diarrhea is improving  Plan:  3/22 K of 2.8, per RN patient did not tolerate IV runs of KCl very well and would like to try oral supplementation. Will order KCl 420m PO q4h x 2 doses. Will recheck K and Mag at 18:00.   3/22 @ 1800:  K = 3.9,  Mag = 2.1 No additional electrolyte replacement needed at this time.   Will recheck electrolytes on 3/23 with AM labs.   Haylie Mccutcheon D 01/17/2019 7:03 PM

## 2019-01-17 NOTE — Discharge Summary (Signed)
[place] at [place] NAME: Natasha Mueller    MR#:  [id number]  DATE OF BIRTH:  June 05, 1966  DATE OF ADMISSION:  01/10/2019   ADMITTING PHYSICIAN: Arta Silence, MD  DATE OF DISCHARGE: 01/17/2019  PRIMARY CARE PHYSICIAN: Trinna Post, PA-C   ADMISSION DIAGNOSIS:  Ulcerative colitis with complication, unspecified location ([place]) [K51.919] Sepsis without acute organ dysfunction, due to unspecified organism ([place]) [A41.9] Colitis [K52.9] DISCHARGE DIAGNOSIS:  Active Problems:   Colitis   Ulcerative colitis with complication ([place])   Personal history of digestive disease   Inflammatory bowel disease  SECONDARY DIAGNOSIS:   Past Medical History:  Diagnosis Date  . Allergy   . BRCA negative 2013   with BART  . Family history of breast cancer    BRCA/Bart neg 2013; IBIS=15%  . UC (ulcerative colitis) [name])    HOSPITAL COURSE:  53 year old female with history of ulcerative colitis who presents with fever and abdominal pain.  1.  Sepsis: Patient presented with fever and tachycardia. Sepsis is due to colitis.  Stopped Zosyn.  2.  Ulcerative colitis flare: She is on IV steroids as per GI.   S/p colonoscopy: Diffuse moderate inflammation was found in thetransverse colon secondary to colitis. Biopsied. She is on IV Solu-Medrol.  start prednisone taper until GI appointment per Dr. Ricky Stabs recommendation.  3.  Hypokalemia: Repleted and recheck later. Mg wnl  4  Acute on chronic anemia: Hemoglobin is stable.    5.  Anxiety: Xanax/Ativan as needed I discussed with Dr. Alice Reichert. DISCHARGE CONDITIONS:  Stable, discharge to home today. CONSULTS OBTAINED:  Treatment Team:  Arta Silence, MD Lucilla Lame, MD Efrain Sella, MD DRUG ALLERGIES:   Allergies  Allergen Reactions  . Hyoscyamine Sulfate Swelling    Tongue swelling   DISCHARGE MEDICATIONS:   Allergies as of 01/17/2019      Reactions   Hyoscyamine  Sulfate Swelling   Tongue swelling      Medication List    TAKE these medications   mercaptopurine 50 MG tablet Commonly known as:  PURINETHOL Take 50 mg by mouth daily.   predniSONE 10 MG tablet Commonly known as:  DELTASONE 40 mg daily po for 3 days, 30 mg daily po for 3 days, 20 mg daily po until follow up Dr. Marius Ditch.        DISCHARGE INSTRUCTIONS:  See AVS. If you experience worsening of your admission symptoms, develop shortness of breath, life threatening emergency, suicidal or homicidal thoughts you must seek medical attention immediately by calling 911 or calling your MD immediately  if symptoms less severe.  You Must read complete instructions/literature along with all the possible adverse reactions/side effects for all the Medicines you take and that have been prescribed to you. Take any new Medicines after you have completely understood and accpet all the possible adverse reactions/side effects.   Please note  You were cared for by a hospitalist during your hospital stay. If you have any questions about your discharge medications or the care you received while you were in the hospital after you are discharged, you can call the unit and asked to speak with the hospitalist on call if the hospitalist that took care of you is not available. Once you are discharged, your primary care physician will handle any further medical issues. Please note that NO REFILLS for any discharge medications will be authorized once you are discharged, as it is imperative that you return to your primary care  physician (or establish a relationship with a primary care physician if you do not have one) for your aftercare needs so that they can reassess your need for medications and monitor your lab values.    On the day of Discharge:  VITAL SIGNS:  Blood pressure 120/66, pulse 72, temperature 98.7 F (37.1 C), temperature source Oral, resp. rate 17, height 5' 4"  (1.626 m), weight 59.4 kg, last  menstrual period 06/14/2016, SpO2 99 %. PHYSICAL EXAMINATION:  GENERAL:  53 y.o.-year-old patient lying in the bed with no acute distress.  EYES: Pupils equal, round, reactive to light and accommodation. No scleral icterus. Extraocular muscles intact.  HEENT: Head atraumatic, normocephalic. Oropharynx and nasopharynx clear.  NECK:  Supple, no jugular venous distention. No thyroid enlargement, no tenderness.  LUNGS: Normal breath sounds bilaterally, no wheezing, rales,rhonchi or crepitation. No use of accessory muscles of respiration.  CARDIOVASCULAR: S1, S2 normal. No murmurs, rubs, or gallops.  ABDOMEN: Soft, non-tender, non-distended. Bowel sounds present. No organomegaly or mass.  EXTREMITIES: No pedal edema, cyanosis, or clubbing.  NEUROLOGIC: Cranial nerves II through XII are intact. Muscle strength 5/5 in all extremities. Sensation intact. Gait not checked.  PSYCHIATRIC: The patient is alert and oriented x 3.  SKIN: No obvious rash, lesion, or ulcer.  DATA REVIEW:   CBC Recent Labs  Lab 01/16/19 0616  WBC 8.7  HGB 9.1*  HCT 28.7*  PLT 515*    Chemistries  Recent Labs  Lab 01/10/19 1909  01/16/19 0903 01/17/19 0716  NA 137   < >  --  140  K 3.1*   < >  --  2.8*  CL 106   < >  --  105  CO2 22   < >  --  27  GLUCOSE 119*   < >  --  116*  BUN 11   < >  --  7  CREATININE 0.54   < >  --  0.55  CALCIUM 8.3*   < >  --  8.4*  MG 2.0   < > 2.1  --   AST 27  --   --   --   ALT 39  --   --   --   ALKPHOS 58  --   --   --   BILITOT 0.5  --   --   --    < > = values in this interval not displayed.     Microbiology Results  Results for orders placed or performed during the hospital encounter of 01/10/19  Blood Culture (routine x 2)     Status: None   Collection Time: 01/10/19  7:09 PM  Result Value Ref Range Status   Specimen Description BLOOD RIGHT HAND  Final   Special Requests   Final    BOTTLES DRAWN AEROBIC AND ANAEROBIC Blood Culture adequate volume   Culture    Final    NO GROWTH 5 DAYS Performed at [facility], [address], [place],  [address]    Report Status 01/15/2019 FINAL  Final  Blood Culture (routine x 2)     Status: None   Collection Time: 01/10/19  7:09 PM  Result Value Ref Range Status   Specimen Description BLOOD LEFT ANTECUBITAL  Final   Special Requests   Final    BOTTLES DRAWN AEROBIC AND ANAEROBIC Blood Culture adequate volume   Culture   Final    NO GROWTH 5 DAYS Performed at [facility], [address],  [place], [place] [address]    Report Status 01/15/2019 FINAL  Final  Urine Culture     Status: Abnormal   Collection Time: 01/10/19  7:09 PM  Result Value Ref Range Status   Specimen Description   Final    URINE, RANDOM Performed at [facility], [address], [place], [place] [address]    Special Requests   Final    NONE Performed at [facility], [address] [place], [place] [address]    Culture (A)  Final    <10,000 COLONIES/mL INSIGNIFICANT GROWTH Performed at [facility] [address], [place], [place] [address]    Report Status 01/12/2019 FINAL  Final  C difficile quick scan w PCR reflex     Status: None   Collection Time: 01/11/19 10:08 AM  Result Value Ref Range Status   C Diff antigen NEGATIVE NEGATIVE Final   C Diff toxin NEGATIVE NEGATIVE Final   C Diff interpretation No C. difficile detected.  Final    Comment: Performed at [facility], [address] [place], [place] [address]  Gastrointestinal Panel by PCR , Stool     Status: None   Collection Time: 01/11/19 10:08 AM  Result Value Ref Range Status   Campylobacter species NOT DETECTED NOT DETECTED Final   Plesimonas shigelloides NOT DETECTED NOT DETECTED Final   Salmonella species NOT DETECTED NOT DETECTED Final   Yersinia enterocolitica NOT DETECTED NOT DETECTED Final   Vibrio species NOT DETECTED NOT DETECTED Final   Vibrio cholerae NOT DETECTED NOT DETECTED Final    Enteroaggregative E coli (EAEC) NOT DETECTED NOT DETECTED Final   Enteropathogenic E coli (EPEC) NOT DETECTED NOT DETECTED Final   Enterotoxigenic E coli (ETEC) NOT DETECTED NOT DETECTED Final   Shiga like toxin producing E coli (STEC) NOT DETECTED NOT DETECTED Final   Shigella/Enteroinvasive E coli (EIEC) NOT DETECTED NOT DETECTED Final   Cryptosporidium NOT DETECTED NOT DETECTED Final   Cyclospora cayetanensis NOT DETECTED NOT DETECTED Final   Entamoeba histolytica NOT DETECTED NOT DETECTED Final   Giardia lamblia NOT DETECTED NOT DETECTED Final   Adenovirus F40/41 NOT DETECTED NOT DETECTED Final   Astrovirus NOT DETECTED NOT DETECTED Final   Norovirus GI/GII NOT DETECTED NOT DETECTED Final   Rotavirus A NOT DETECTED NOT DETECTED Final   Sapovirus (I, II, IV, and V) NOT DETECTED NOT DETECTED Final    Comment: Performed at [facility] [place] [facility], [address] [place], [place] [address]    RADIOLOGY:  No results found.   Management plans discussed with the patient, her daughter and they are in agreement.  CODE STATUS: Full Code   TOTAL TIME TAKING CARE OF THIS PATIENT: 33 minutes.    Demetrios Loll M.D on 01/17/2019 at 2:37 PM  Between 7am to 6pm - Pager - [phone]  After 6pm go to [email or web address] - password [email or web address] [facility]  [facility]  Office  [phone]  CC: Primary care physician; Trinna Post, PA-C   Note: This dictation was prepared with Dragon dictation along with smaller phrase technology. Any transcriptional errors that result from this process are unintentional.

## 2019-01-17 NOTE — Progress Notes (Signed)
[facility] Inpatient Progress Note  Subjective: Patient seen for f/u ulcerative colitis. Patient doing very well with no abdominal pain, diarrhea, hematochezia or fever. Would like to go home and follow up outpatient with Dr. Marius Ditch.  Objective: Vital signs in last 24 hours: Temp:  [98.2 F (36.8 C)-99.1 F (37.3 C)] 98.7 F (37.1 C) (03/22 1204) Pulse Rate:  [69-74] 72 (03/22 1204) Resp:  [17-20] 17 (03/22 1204) BP: (120-128)/(64-71) 120/66 (03/22 1204) SpO2:  [97 %-99 %] 99 % (03/22 1204) Blood pressure 120/66, pulse 72, temperature 98.7 F (37.1 C), temperature source Oral, resp. rate 17, height 5' 4"  (1.626 m), weight 59.4 kg, last menstrual period 06/14/2016, SpO2 99 %.    Intake/Output from previous day: 03/21 0701 - 03/22 0700 In: -  Out: 1750 [Urine:1750]  Intake/Output this shift: Total I/O In: -  Out: 450 [Urine:450]   General appearance:  Alert, NAD Resp: CTA Cardio:RRR GI:  Soft, nt, nd. No mass or organomegaly. BS+ Extremities:  No edema.   Lab Results: Results for orders placed or performed during the hospital encounter of 01/10/19 (from the past 24 hour(s))  Basic metabolic panel     Status: Abnormal   Collection Time: 01/17/19  7:16 AM  Result Value Ref Range   Sodium 140 135 - 145 mmol/L   Potassium 2.8 (L) 3.5 - 5.1 mmol/L   Chloride 105 98 - 111 mmol/L   CO2 27 22 - 32 mmol/L   Glucose, Bld 116 (H) 70 - 99 mg/dL   BUN 7 6 - 20 mg/dL   Creatinine, Ser 0.55 0.44 - 1.00 mg/dL   Calcium 8.4 (L) 8.9 - 10.3 mg/dL   GFR calc non Af Amer >60 >60 mL/min   GFR calc Af Amer >60 >60 mL/min   Anion gap 8 5 - 15     Recent Labs    01/16/19 0616  WBC 8.7  HGB 9.1*  HCT 28.7*  PLT 515*   BMET Recent Labs    01/15/19 0432 01/16/19 0606 01/17/19 0716  NA 142 141 140  K 3.0* 3.2* 2.8*  CL 112* 109 105  CO2 23 25 27   GLUCOSE 96 109* 116*  BUN 6 8 7   CREATININE 0.47 0.63 0.55  CALCIUM 7.8* 8.0* 8.4*   LFT No results for  input(s): PROT, ALBUMIN, AST, ALT, ALKPHOS, BILITOT, BILIDIR, IBILI in the last 72 hours. PT/INR No results for input(s): LABPROT, INR in the last 72 hours. Hepatitis Panel No results for input(s): HEPBSAG, HCVAB, HEPAIGM, HEPBIGM in the last 72 hours. C-Diff No results for input(s): CDIFFTOX in the last 72 hours. No results for input(s): CDIFFPCR in the last 72 hours.   Studies/Results: No results found.  Scheduled Inpatient Medications:   . acidophilus  1 capsule Oral BID  . enoxaparin (LOVENOX) injection  40 mg Subcutaneous Q24H  . mercaptopurine  50 mg Oral Daily  . methylPREDNISolone (SOLU-MEDROL) injection  60 mg Intravenous Daily    Continuous Inpatient Infusions:   . sodium chloride Stopped (01/12/19 1505)    PRN Inpatient Medications:  sodium chloride, acetaminophen **OR** acetaminophen, ALPRAZolam, bisacodyl, LORazepam, morphine injection **OR** morphine injection, ondansetron **OR** ondansetron (ZOFRAN) IV, ondansetron (ZOFRAN) IV, senna-docusate, traZODone    Assessment:  1.  Ulcerative colitis with grossly active colitis noted on colonoscopy performed yesterday, 01/15/2019 by Dr. Allen Norris.  Biopsies are pending.  Patient feels better since that antibiotics have been discontinued and currently says she feels well. She is tolerating a regular diet.  Plan:  1. Resume 6-MP daily for now. 2. Switch to po steroids. 75m po daily x 3 days, 30 mg po daily x 3 days, then 259mpo daily until followup. 3. Consider elective initiation of IV biologic at outpatient followup with Dr. VaMarius Ditch  Teodoro K. ToAlice ReichertM.D. 01/17/2019, 3:02 PM

## 2019-01-17 NOTE — Progress Notes (Signed)
PHARMACY CONSULT NOTE - FOLLOW UP  Pharmacy Consult for Electrolyte Monitoring and Replacement   Recent Labs: Potassium (mmol/L)  Date Value  01/17/2019 2.8 (L)   Magnesium (mg/dL)  Date Value  01/16/2019 2.1   Calcium (mg/dL)  Date Value  01/17/2019 8.4 (L)   Albumin (g/dL)  Date Value  01/10/2019 3.2 (L)  10/19/2018 4.8   Phosphorus (mg/dL)  Date Value  01/10/2019 2.9   Sodium (mmol/L)  Date Value  01/17/2019 140  10/19/2018 139     Assessment: Patient is a 53yo female with h/o ulcerative colitis admitted for increased abdominal pain and diarrhea. Pharmacy consulted for electrolyte replacement.  Patient received KCl 17mq IV x 4 doses on 3/21  Per MD note diarrhea is improving  Plan:  3/22 K of 2.8, per RN patient did not tolerate IV runs of KCl very well and would like to try oral supplementation. Will order KCl 474m PO q4h x 2 doses. Will recheck K and Mag at 18:00.   LiPaulina FusiPharmD, BCPS 01/17/2019 8:25 AM

## 2019-01-18 ENCOUNTER — Ambulatory Visit: Payer: Self-pay | Admitting: Physician Assistant

## 2019-01-18 ENCOUNTER — Telehealth: Payer: Self-pay | Admitting: Gastroenterology

## 2019-01-18 ENCOUNTER — Telehealth: Payer: Self-pay

## 2019-01-18 NOTE — Telephone Encounter (Signed)
Please make a televisit with her next week even though I'm on consults. Any day is fine with me   Thanks  Alieyah Spader

## 2019-01-18 NOTE — Telephone Encounter (Signed)
Transition Care Management Follow-up Telephone Call  Date of discharge and from where: [facility] on 01/17/19  How have you been since you were released from the hospital? Doing well, was able to get some sleep last night. Declines fever, pain or n/v/d.  Any questions or concerns? No   Items Reviewed:  Did the pt receive and understand the discharge instructions provided? Yes   Medications obtained and verified? Yes   Any new allergies since your discharge? No   Dietary orders reviewed? Yes  Do you have support at home? Yes   Other (ie: DME, Home Health, etc) N/A  Functional Questionnaire: (I = Independent and D = Dependent)  Bathing/Dressing- I   Meal Prep- I  Eating- I  Maintaining continence- I  Transferring/Ambulation- I  Managing Meds- I   Follow up appointments reviewed:    PCP Hospital f/u appt confirmed? Yes  Scheduled to see Adriana on 01/22/19 @ 3:40 PM.  [facility] f/u appt confirmed? Pt to call today and schedule an apt with Dr Marius Ditch.    Are transportation arrangements needed? No   If their condition worsens, is the pt aware to call  their PCP or go to the ED? Yes  Was the patient provided with contact information for the PCP's office or ED? Yes  Was the pt encouraged to call back with questions or concerns? Yes

## 2019-01-18 NOTE — Telephone Encounter (Signed)
No HFU scheduled.  

## 2019-01-18 NOTE — Telephone Encounter (Signed)
Patient called & l/m on v/m stating she was admitted through the Ed just discharged. She is needing a 1 wk f/u from the hospital for Colitis. Please advise.

## 2019-01-19 LAB — SURGICAL PATHOLOGY

## 2019-01-19 NOTE — Telephone Encounter (Signed)
Patient has been scheduled for Monday 01-25-19 @ 2:00 for her TeleVisit. Currently not on the scheduled because I'm waiting for Jody to open the time.

## 2019-01-20 NOTE — Telephone Encounter (Signed)
If she has thermometer and BP cuff at home, let's see if we can make this an E-visit. She is on immunosuppressant therapy and I would rather she not come in if it can be helped.

## 2019-01-21 ENCOUNTER — Telehealth: Payer: Self-pay | Admitting: Gastroenterology

## 2019-01-21 LAB — HEPATITIS PANEL, ACUTE
HCV Ab: <0.1 s/co ratio (ref 0.0–0.9)
Hep A IgM: NEGATIVE
Hep B C IgM: NEGATIVE
Hepatitis B Surface Ag: NEGATIVE

## 2019-01-21 NOTE — Telephone Encounter (Signed)
I called and spoke with patient and advised her as below. Patient is willing to do an e visit. She has a thermometer, and thinks her daughter may have a blood pressure cuff. Patient will call back to let us know for sure if her daughter has a blood pressure cuff. When patient calls back please get patients email address,  schedule an e visit, and reconcile meds. Thanks

## 2019-01-21 NOTE — Telephone Encounter (Signed)
Patient called & stated Dr Marius Ditch had left her a voice mail stating she was going to start paperwork for some medication.(did not know name) She would like to wait on that until she talks with Dr Marius Ditch on the tele visit on 01-25-19.

## 2019-01-22 ENCOUNTER — Encounter: Payer: Self-pay | Admitting: Gastroenterology

## 2019-01-22 ENCOUNTER — Ambulatory Visit (INDEPENDENT_AMBULATORY_CARE_PROVIDER_SITE_OTHER): Payer: BLUE CROSS/BLUE SHIELD | Admitting: Physician Assistant

## 2019-01-22 VITALS — BP 122/72

## 2019-01-22 DIAGNOSIS — F419 Anxiety disorder, unspecified: Secondary | ICD-10-CM | POA: Diagnosis not present

## 2019-01-22 DIAGNOSIS — Z09 Encounter for follow-up examination after completed treatment for conditions other than malignant neoplasm: Secondary | ICD-10-CM | POA: Diagnosis not present

## 2019-01-22 DIAGNOSIS — K51019 Ulcerative (chronic) pancolitis with unspecified complications: Secondary | ICD-10-CM | POA: Diagnosis not present

## 2019-01-22 NOTE — Progress Notes (Signed)
Patient: Natasha Mueller Female    DOB: 10/10/1966   53 y.o.   MRN: [id number] Visit Date: 01/26/2019  Today's Provider: Trinna Post, PA-C   Chief Complaint  Patient presents with  . Hospitalization Follow-up   Subjective:     HPI   Today's visit is conducted virtually due to limitations on office visits from COVID-19.    Follow up Hospitalization  Patient was admitted to [facility] on 01/10/2019 and discharged on 01/17/2019. She was treated for ulcerative Colitis, Sepsis due to colitis. HPI from GI note:   "Natasha Mueller is a 53 year old female with history of ulcerative colitis diagnosed in 2006, previously on 5-ASA compounds without any response, switched to 6-MP which patient has been taking intermittently.  Patient was recently admitted to [facility] on 01/11/2019 due to abdominal pain, fever and CT revealed inflammation in her colon.  She was empirically started on antibiotics after stool studies came back negative for infection including C. difficile.  She was also found to have elevated CRP 13.7, ESR 60 as well as new onset of normocytic anemia.  Because she reported that she was steroid resistant in the past, she was not started on steroids on admission.  Due to persistent symptoms of abdominal pain, diarrhea started her on IV Solu-Medrol.  Subsequently, she underwent colonoscopy on 01/15/2019 which revealed mild to moderate continuous inflammation, severe in the right colon compared to left colon, advanced to transverse colon only due to severity of inflammation.  She was improving after discontinuation of antibiotics, she was transitioned to p.o. prednisone and was discharged home.  Today, she reports that her symptoms have been in remission, denies abdominal pain, nausea, vomiting or diarrhea, she continues to take 6-MP 50 mg daily.  She is currently on prednisone 10 mg daily on a quick taper."  During her hospital course, she experienced some anemia which did improve and also hypokalemia  which also improved.   CMP Latest Ref Rng & Units 01/17/2019 01/17/2019 01/16/2019  Glucose 70 - 99 mg/dL - 116(H) 109(H)  BUN 6 - 20 mg/dL - 7 8  Creatinine 0.44 - 1.00 mg/dL - 0.55 0.63  Sodium 135 - 145 mmol/L - 140 141  Potassium 3.5 - 5.1 mmol/L 3.9 2.8(L) 3.2(L)  Chloride 98 - 111 mmol/L - 105 109  CO2 22 - 32 mmol/L - 27 25  Calcium 8.9 - 10.3 mg/dL - 8.4(L) 8.0(L)  Total Protein 6.5 - 8.1 g/dL - - -  Total Bilirubin 0.3 - 1.2 mg/dL - - -  Alkaline Phos 38 - 126 U/L - - -  AST 15 - 41 U/L - - -  ALT 0 - 44 U/L - - -     GI appointment scheduled 01/25/2019 with Dr. Marius Ditch at [facility]. She reports they will discuss adding another medication at this follow up.  Telephone follow up was done on 01/18/2019 She reports excellent compliance with treatment. She reports this condition is Improved. Patient reports that she still feeling well, does report some continued disease. She is continuing to take her prednisone and she is compliant with 6-MP.   She reports she is having some issues with anxiety that persisted throughout hospital course and continued after discharge. She was previously on zoloft 75 mg QD and reports she had good relief from this.  ------------------------------------------------------------------------------------     Allergies  Allergen Reactions  . Hyoscyamine Sulfate Swelling    Tongue swelling     Current Outpatient Medications:  .  mercaptopurine (PURINETHOL) 50 MG tablet, Take 50 mg by mouth daily., Disp: , Rfl:  .  predniSONE (DELTASONE) 10 MG tablet, 40 mg daily po for 3 days, 30 mg daily po for 3 days, 20 mg daily po until follow up Dr. Marius Ditch., Disp: 40 tablet, Rfl: 0 .  hydrOXYzine (ATARAX/VISTARIL) 10 MG tablet, Take 1 tablet (10 mg total) by mouth daily as needed., Disp: 30 tablet, Rfl: 0 .  sertraline (ZOLOFT) 50 MG tablet, Take 1 tablet (50 mg total) by mouth daily for 14 days, THEN 1.5 tablets (75 mg total) daily., Disp: 145 tablet, Rfl: 0   Review of Systems  Social History   Tobacco Use  . Smoking status: Never Smoker  . Smokeless tobacco: Never Used  Substance Use Topics  . Alcohol use: No      Objective:   BP 122/72 (BP Location: Right Arm, Patient Position: Sitting, Cuff Size: Normal)   LMP 06/14/2016 (Exact Date)  Vitals:   01/22/19 1341  BP: 122/72     Physical Exam Constitutional:      Appearance: Normal appearance.     Comments: Appears fatigued.   Pulmonary:     Effort: Pulmonary effort is normal. No respiratory distress.  Skin:    Coloration: Skin is pale.  Neurological:     Mental Status: She is alert. Mental status is at baseline.  Psychiatric:        Mood and Affect: Mood normal.        Behavior: Behavior normal.         Assessment & Plan    1. Hospital discharge follow-up  She is being discharged from week long hospitalization 2/2 UC with complication. Her anemia was improving upon discharge as was hypokalemia. I have reviewed hospital course, images, and labwork. I have reconciled medications, she is continued on prednisone taper and 6MP. At the time of writing this note, patient has seen her Dr. Marius Ditch with GI and will be starting Humira. She will need vaccinations regarding this and will be scheduled in our clinic for the same.   She is being cared for by her daughter who is a Marine scientist and also my patient. Daughter reports she currently has FMLA extending to next Friday but may require more if her mother starts new biologic as patient is currently recovering in daughter's home and daughter is worried about spreading coronavirus. I will fill out FMLA regarding this if/when it is faxed to our office.   2. Anxiety  Will restart zoloft as below.  - sertraline (ZOLOFT) 50 MG tablet; Take 1 tablet (50 mg total) by mouth daily for 14 days, THEN 1.5 tablets (75 mg total) daily.  Dispense: 145 tablet; Refill: 0 - hydrOXYzine (ATARAX/VISTARIL) 10 MG tablet; Take 1 tablet (10 mg total) by mouth  daily as needed.  Dispense: 30 tablet; Refill: 0  3. Ulcerative pancolitis with complication (HCC)  Taking 6MP and will be starting Humira.  The entirety of the information documented in the History of Present Illness, Review of Systems and Physical Exam were personally obtained by me. Portions of this information were initially documented by Lyndel Pleasure, CMA and reviewed by me for thoroughness and accuracy.   Return if symptoms worsen or fail to improve.         Trinna Post, PA-C  [facility]

## 2019-01-22 NOTE — Telephone Encounter (Signed)
Patient scheduled.

## 2019-01-25 ENCOUNTER — Ambulatory Visit (INDEPENDENT_AMBULATORY_CARE_PROVIDER_SITE_OTHER): Payer: BLUE CROSS/BLUE SHIELD | Admitting: Gastroenterology

## 2019-01-25 ENCOUNTER — Telehealth: Payer: Self-pay

## 2019-01-25 ENCOUNTER — Telehealth: Payer: BLUE CROSS/BLUE SHIELD | Admitting: Gastroenterology

## 2019-01-25 DIAGNOSIS — K5100 Ulcerative (chronic) pancolitis without complications: Secondary | ICD-10-CM | POA: Diagnosis not present

## 2019-01-25 MED ORDER — HYDROXYZINE HCL 10 MG PO TABS: 10.0000 mg | ORAL_TABLET | Freq: Every day | ORAL | 0 refills | Status: DC | PRN

## 2019-01-25 MED ORDER — SERTRALINE HCL 50 MG PO TABS: ORAL_TABLET | ORAL | 0 refills | Status: DC

## 2019-01-25 NOTE — Telephone Encounter (Signed)
Please review. KW 

## 2019-01-25 NOTE — Progress Notes (Signed)
Cephas Darby, MD [address]  [place]  [place], [place] [address]  Main: [phone]  Fax: [phone]    Gastroenterology Consultation Virtual/Tele Visit  Referring Provider:     Paulene Floor Primary Care Physician:  Trinna Post, PA-C Primary Gastroenterologist:  Dr. Vira Agar Reason for Consultation:     Ulcerative colitis        HPI:   Natasha Mueller is a 53 y.o. female referred by Dr. Trinna Post, PA-C  for consultation & management of ulcerative colitis  Virtual Visit via Telephone Note  I connected with Natasha Mueller on 01/25/19 at  2:00 PM EDT by telephone and verified that I am speaking with the correct person using two identifiers.   I discussed the limitations, risks, security and privacy concerns of performing an evaluation and management service by telephone and the availability of in person appointments. I also discussed with the patient that there may be a patient responsible charge related to this service. The patient expressed understanding and agreed to proceed.  Location of the Patient: Home  Location of the provider: Office   History of Present Illness: Natasha Mueller is a 53 year old female with history of ulcerative colitis diagnosed in 2006, previously on 5-ASA compounds without any response, switched to 6-MP which patient has been taking intermittently.  Patient was recently admitted to [facility] on 01/11/2019 due to abdominal pain, fever and CT revealed inflammation in her colon.  She was empirically started on antibiotics after stool studies came back negative for infection including C. difficile.  She was also found to have elevated CRP 13.7, ESR 60 as well as new onset of normocytic anemia.  Because she reported that she was steroid resistant in the past, she was not started on steroids on admission.  Due to persistent symptoms of abdominal pain, diarrhea started her on IV Solu-Medrol.  Subsequently, she underwent colonoscopy on  01/15/2019 which revealed mild to moderate continuous inflammation, severe in the right colon compared to left colon, advanced to transverse colon only due to severity of inflammation.  She was improving after discontinuation of antibiotics, she was transitioned to p.o. prednisone and was discharged home.  Today, she reports that her symptoms have been in remission, denies abdominal pain, nausea, vomiting or diarrhea, she continues to take 6-MP 50 mg daily.  She is currently on prednisone 10 mg daily on a quick taper.    IBD diagnosis: Ulcerative colitis diagnosed in 2006  Disease course: -- 12/11/04: CC - abdominal pain & 10-15 episodes of diarrhea with frank blood per day x 3 months. -- 12/13/04: CSY - congested, erythematous, friable, granular, hemorrhagic, and ulcerated mucosa in rectum, sigmoid, splenic flexure, transverse, hepatic flexure, and ascending colon (path: active colitis with crypt abscess formation c/w IBD).  She was initially treated with Asacol and Lialda with no clinical response.  Later switched to 6-MP, initially 100 mg, gradually decreased to 50 mg as she said she was not tolerating it well.   She reports having intermittent symptoms for which she has been taking prednisone on and off as well as 6-MP as needed   Extra intestinal manifestations: None  IBD surgical history: None  Imaging:  MRE none CTE none CT abdomen and pelvis with contrast on 01/10/2019 1. Irregular thickening of the wall of the left transverse colon with mild associated inflammation, consistent with active inflammatory bowel disease. No abscess or extraluminal/free air. No evidence of bowel obstruction. Associated mild mesenteric adenopathy. 2. No  other acute abnormality within the abdomen or pelvis. SBFT none  Procedures:  Colonoscopy -- 12/13/04: CSY - congested, erythematous, friable, granular, hemorrhagic, and ulcerated mucosa in rectum, sigmoid, splenic flexure, transverse, hepatic flexure, and  ascending colon (path: active colitis with crypt abscess formation c/w IBD). -- 03/07/05: CSY - normal TI and cecum, 6-8 mm hyperplastic in transverse colon. -- 01/17/10: CSY - sigmoid/descending/ascending diverticulosis, pseudopolyps in splenic flexure, internal hemorrhoids (path: colonic mucosa w/o pathologic abnormalities). 11/10/13: CSY - normal TI, 3 diminutive polyps at hepatic flexure (path: focal glandular hyperplasia and lymphoid aggregates), sigmoid diverticulosis, small internal hemorrhoids (random bx path: colonic mucosa focal glandular hyperplasia and lymphoid aggregates);  01/15/2019 - Diffuse moderate inflammation was found in the transverse colon secondary to colitis. Biopsied. - Diffuse mild inflammation was found in the descending colon secondary to colitis. - The sigmoid colon is normal. Biopsied. - Diffuse mild inflammation was found in the rectum secondary to colitis. Biopsied. - Diverticulosis in the sigmoid colon, in the descending colon and in the transverse colon. DIAGNOSIS:  A. COLON, TRANSVERSE; COLD BIOPSY:  - CHRONIC COLITIS WITH SEVERE ACTIVITY (CRYPT DESTRUCTION, CRYPT  ABSCESSES, AND CRYPTITIS).  - NEGATIVE FOR GRANULOMA, DYSPLASIA, AND MALIGNANCY.   B. COLON, DESCENDING; COLD BIOPSY:  - CHRONIC COLITIS WITH MODERATE ACTIVITY (CRYPTITIS AND CRYPT  ABSCESSES).  - NEGATIVE FOR GRANULOMA, DYSPLASIA, AND MALIGNANCY.   C. COLON, SIGMOID; COLD BIOPSY:  - CHRONIC COLITIS WITHOUT ACTIVITY.  - NEGATIVE FOR GRANULOMA, DYSPLASIA, AND MALIGNANCY.   D. RECTUM; COLD BIOPSY:  - CHRONIC PROCTITIS WITH OUT ACTIVITY.  - NEGATIVE FOR GRANULOMA, DYSPLASIA, AND MALIGNANCY.   Upper Endoscopy 11/10/13: EGD - normal esophagus, single fundic gland polyp, normal duodenum.   VCE none  IBD medications:  Steroids: prednisone (max dose = 60 mg daily, total usage lasted > 6 months. 5-ASA: Asacol 800 mg TID (11/2004-10/2006), Lialda 2.4 g daily (10/2006, d/c due to low back discomfort,  loose/mushy stools, palpitations). Topical 5-ASAs - no. Immunomodulators: 6-mercaptopurine, 6-MP 100 > 75 > 50 mg daily (12/2004 - 09/2005) TPMT status unknown Biologics: Nave Anti TNFs: Anti Integrins: Ustekinumab: Tofactinib: Clinical trial:  NSAIDs: None  Antiplts/Anticoagulants/Anti thrombotics: None   Past Medical History:  Diagnosis Date  . Allergy   . BRCA negative 2013   with BART  . Family history of breast cancer    BRCA/Bart neg 2013; IBIS=15%  . UC (ulcerative colitis) [facility])     Past Surgical History:  Procedure Laterality Date  . CESAREAN SECTION    . COLONOSCOPY WITH PROPOFOL N/A 01/15/2019   Procedure: COLONOSCOPY WITH PROPOFOL;  Surgeon: Lucilla Lame, MD;  Location: [facility] ENDOSCOPY;  Service: Endoscopy;  Laterality: N/A;     Current Outpatient Medications:  .  hydrOXYzine (ATARAX/VISTARIL) 10 MG tablet, Take 1 tablet (10 mg total) by mouth daily as needed., Disp: 30 tablet, Rfl: 0 .  mercaptopurine (PURINETHOL) 50 MG tablet, Take 50 mg by mouth daily., Disp: , Rfl:  .  predniSONE (DELTASONE) 10 MG tablet, 40 mg daily po for 3 days, 30 mg daily po for 3 days, 20 mg daily po until follow up Dr. Marius Ditch., Disp: 40 tablet, Rfl: 0 .  sertraline (ZOLOFT) 50 MG tablet, Take 1 tablet (50 mg total) by mouth daily for 14 days, THEN 1.5 tablets (75 mg total) daily., Disp: 145 tablet, Rfl: 0   Family History  Problem Relation Age of Onset  . Breast cancer Mother 85  . Hypertension Mother   . Atrial fibrillation Father   . Diabetes  Sister   . Breast cancer Sister   . Atrial fibrillation Brother      Social History   Tobacco Use  . Smoking status: Never Smoker  . Smokeless tobacco: Never Used  Substance Use Topics  . Alcohol use: No  . Drug use: No    Allergies as of 01/25/2019 - Review Complete 01/25/2019  Allergen Reaction Noted  . Hyoscyamine sulfate Swelling 03/13/2015     Imaging Studies: Reviewed  Assessment and Plan:   Natasha Mueller is a  53 y.o. female with history of ulcerative pancolitis, moderately severe, currently maintained on 6-MP, biologic nave admitted with recent flareup, colonoscopy confirmed exacerbation of ulcerative colitis, discharged on oral prednisone.  Currently in clinical remission.  I discussed with her about the need to step up treatment for UC to Biologics.  I discussed various options including anti-TNF's, anti-integrins, anti-IL 12, 23 agents such as ustekinumab, small molecule such as tofacitinib, risks and benefits including but not limited to injection site reactions, allergic reaction, infusion reactions, infection, risk for lymphoma, risk for melanoma and nonmelanoma skin cancer particularly with combination of anti-TNF's and thiopurines.  I advised her we could try Humira or vedolizumab.  With her insurance, patient preferred to apply for Humira first and also she preferred injectable to intravenous medication.   IBD Health Maintenance  1.TB status: QuantiFERON gold negative on 01/15/2019 2. Anemia: Normocytic anemia, check iron studies, B12 and folate levels Check TPM T levels 3.Immunizations: Hep A and B recommend Twinrix vaccine, serologies negative, Influenza recommend annually flu vaccine, prevnar to be administered, pneumovax to be administered, Varicella unknown, Zoster recommend Shingrix vaccine 4.Cancer screening I) Colon cancer/dysplasia surveillance: No evidence of dysplasia to date Recommend colonoscopy for dysplasia surveillance in 6 months II) Cervical cancer: Annual Pap smear up to date, reportedly negative III) Skin cancer - counseled about annual skin exam by dermatology and skin protection in summer using sun screen SPF > 51, clothing.  She has history of squamous cell carcinoma of the skin about 5 to 6 years ago.  Seen by dermatologist within last 1 year.  Recommend her to have annual surveillance of her skin by dermatologist 5.Bone health Vitamin D status: Unknown Bone density  testing: Not done 5. Labs: Ordered today 6. Smoking: None 7. NSAIDs and Antibiotics use: None   Follow Up Instructions:   I discussed the assessment and treatment plan with the patient. The patient was provided an opportunity to ask questions and all were answered. The patient agreed with the plan and demonstrated an understanding of the instructions.   The patient was advised to call back or seek an in-person evaluation if the symptoms worsen or if the condition fails to improve as anticipated.  I provided 30 minutes of non-face-to-face time during this encounter.   Follow up in 4 weeks   Cephas Darby, MD

## 2019-01-25 NOTE — Telephone Encounter (Signed)
Patient reports that she had an Evist on Friday with Adriana, and her meds were not sent into the pharmacy. Patient uses [facility] [address]. Thanks!

## 2019-01-25 NOTE — Telephone Encounter (Signed)
Sent!

## 2019-01-26 ENCOUNTER — Telehealth: Payer: Self-pay | Admitting: Physician Assistant

## 2019-01-26 NOTE — Telephone Encounter (Signed)
Can we call patient and set her up for vaccinations? She is about to start Humira and her GI doctor would like her to have the following vaccinations: Prevnar followed by pneumovax in 8 weeks. Hep A and Hep B. Shingrix, and influenza. She should check with her insurance about where shingrix is covered.

## 2019-01-26 NOTE — Patient Instructions (Signed)
Ulcerative Colitis, Adult  Ulcerative colitis is long-lasting (chronic) swelling (inflammation) of the large intestine (colon). Sores (ulcers) may also form on the colon. Ulcerative colitis is closely related to another condition of inflammation of the intestines that is called Crohn disease. Together, they are frequently referred to as inflammatory bowel disease (IBD). What are the causes? Ulcerative colitis is caused by increased activity of the immune system in the intestines. The immune system is the system that protects the body against harmful bacteria, viruses, fungi, and other things that can make you sick. When the immune system overacts, it causes inflammation. The cause of the increased immune system activity is not known. What increases the risk? Risk factors of ulcerative colitis include:  Age. This includes: ? Being 35-85 years old. ? Being older than 55 years old.  Having a family history of ulcerative colitis.  Being of Jewish descent. What are the signs or symptoms? Common symptoms of ulcerative colitis include rectal bleeding and diarrhea. There is a wide range of symptoms, and a person's symptoms depend on how severe the condition is. Additional symptoms may include:  Pain or cramping in the belly (abdomen).  Fever.  Fatigue.  Weight loss.  Night sweats.  Rectal pain.  Feeling the immediate need to have a bowel movement.  Nausea.  Loss of appetite.  Anemia.  Joint pain or soreness.  Eye irritation.  Certain skin rashes. How is this diagnosed? Ulcerative colitis may be diagnosed by:  Medical history and physical exam.  Blood tests and stool tests.  X-rays.  CT scans.  Colonoscopy. For this test, a flexible tube is inserted into your anus and your colon is examined.  Examination of a tissue sample from your colon (biopsy). How is this treated? Treatment for ulcerative colitis may include medicines to:  Decrease inflammation.  Control  your immune system. Surgery may also be necessary. Follow these instructions at home: Medicines and vitamins  Take medicines only as directed by your doctor. Do not take aspirin.  Ask your doctor if you should take any vitamins or supplements. Lifestyle  Exercise regularly.  Limit alcohol intake to no more than 1 drink per day for nonpregnant women and 2 drinks per day for men. One drink equals 12 ounces of beer, 5 ounces of wine, or 1 ounces of hard liquor. Eating and drinking  Drink enough fluid to keep your urine clear or pale yellow.  Ask your health care provider about the best diet for you. Follow the diet as directed by your health care provider. This may include: ? Avoiding carbonated drinks. ? Avoiding popcorn, vegetable skins, nuts, and other high-fiber foods when you have symptoms of ulcerative colitis. ? Eating smaller meals more often. ? Keeping a food diary. This may help you to find and avoid any foods that make you feel not well.  Limit your caffeine intake. General instructions  Keep all follow-up appointments as directed by your health care provider. This is important. Contact a health care provider if:  Your symptoms do not improve or get worse with treatment.  You continue to lose weight.  You have constant cramps or loose bowels.  You develop a new skin rash, skin sores, or eye problems.  You have a fever or chills. Get help right away if:  You have bloody diarrhea.  You have severe pain in your abdomen.  You vomit. This information is not intended to replace advice given to you by your health care provider. Make sure you discuss any  questions you have with your health care provider. Document Released: 07/24/2005 Document Revised: 06/16/2016 Document Reviewed: 02/06/2015 Elsevier Interactive Patient Education  [employer].

## 2019-02-02 ENCOUNTER — Other Ambulatory Visit
Admission: RE | Admit: 2019-02-02 | Discharge: 2019-02-02 | Disposition: A | Discharge status: Home / Self Care | Payer: BLUE CROSS/BLUE SHIELD | Source: Ambulatory Visit | Attending: Gastroenterology | Admitting: Gastroenterology

## 2019-02-02 DIAGNOSIS — K5100 Ulcerative (chronic) pancolitis without complications: Secondary | ICD-10-CM | POA: Insufficient documentation

## 2019-02-02 LAB — FERRITIN: Ferritin: 56 ng/mL (ref 11–307)

## 2019-02-02 LAB — VITAMIN B12: Vitamin B-12: 310 pg/mL (ref 180–914)

## 2019-02-02 LAB — CBC
HCT: 36.6 % (ref 36.0–46.0)
Hemoglobin: 11.7 g/dL — ABNORMAL LOW (ref 12.0–15.0)
MCH: 31.1 pg (ref 26.0–34.0)
MCHC: 32 g/dL (ref 30.0–36.0)
MCV: 97.3 fL (ref 80.0–100.0)
Platelets: 318 10*3/uL (ref 150–400)
RBC: 3.76 MIL/uL — ABNORMAL LOW (ref 3.87–5.11)
RDW: 14.5 % (ref 11.5–15.5)
WBC: 3.1 10*3/uL — ABNORMAL LOW (ref 4.0–10.5)
nRBC: 0 % (ref 0.0–0.2)

## 2019-02-02 LAB — IRON AND TIBC
Iron: 206 ug/dL — ABNORMAL HIGH (ref 28–170)
Saturation Ratios: 61 % — ABNORMAL HIGH (ref 10.4–31.8)
TIBC: 338 ug/dL (ref 250–450)
UIBC: 132 ug/dL

## 2019-02-02 LAB — FOLATE: Folate: 20 ng/mL (ref >5.9)

## 2019-02-05 LAB — THIOPURINE METHYLTRANSFERASE (TPMT), RBC: TPMT Activity:: 27.9 Units/mL RBC

## 2019-02-08 ENCOUNTER — Telehealth: Payer: Self-pay | Admitting: Gastroenterology

## 2019-02-08 NOTE — Telephone Encounter (Signed)
You received Authorization Notification of the approval on Humira 40 mg/0.4 ml pen -injector kit. I've place info in your box.

## 2019-02-10 ENCOUNTER — Ambulatory Visit: Payer: Self-pay | Admitting: Physician Assistant

## 2019-03-02 ENCOUNTER — Ambulatory Visit: Payer: BLUE CROSS/BLUE SHIELD | Admitting: Gastroenterology

## 2019-03-03 ENCOUNTER — Encounter: Payer: Self-pay | Admitting: Gastroenterology

## 2019-03-16 ENCOUNTER — Telehealth: Payer: Self-pay | Admitting: Gastroenterology

## 2019-03-16 NOTE — Telephone Encounter (Signed)
Roderic Palau from [facility] left vm regarding pt  rx Humira maintenance kit transferred to  Different pharmacy they need to Validate the transfer please call [phone]

## 2019-03-17 NOTE — Telephone Encounter (Signed)
Pharmacy has confirmed humira prescription, waiting for approval

## 2019-03-29 ENCOUNTER — Telehealth: Payer: Self-pay | Admitting: Gastroenterology

## 2019-03-29 NOTE — Telephone Encounter (Signed)
Pt is calling she was supposed to get started on Entivio but has not heard anything yet please call pt CB [phone]

## 2019-03-29 NOTE — Telephone Encounter (Signed)
Spoke with pt and confirmed medication and prescription has been resubmitted with correct insurance for approval, will contact pt once medication is approved, pt verbalized understanding

## 2019-04-05 ENCOUNTER — Telehealth: Payer: Self-pay | Admitting: Gastroenterology

## 2019-04-05 NOTE — Telephone Encounter (Signed)
Pt is calling for Dr. Verlin Grills nurse she states she has not heard from the Drug company to start taking Entivio please call pt

## 2019-04-06 NOTE — Telephone Encounter (Signed)
Waiting for PA to approve usually takes about 7 days to clear, pt has been contacted by specialty pharmacy updating pt status

## 2019-04-06 NOTE — Telephone Encounter (Signed)
Patient was calling back stating she had not heard from our office. See previous message.

## 2019-04-12 ENCOUNTER — Encounter: Payer: Self-pay | Admitting: Gastroenterology

## 2019-04-14 ENCOUNTER — Ambulatory Visit (INDEPENDENT_AMBULATORY_CARE_PROVIDER_SITE_OTHER): Payer: Managed Care, Other (non HMO) | Admitting: Physician Assistant

## 2019-04-14 ENCOUNTER — Other Ambulatory Visit: Payer: Self-pay

## 2019-04-14 ENCOUNTER — Encounter: Payer: Self-pay | Admitting: Physician Assistant

## 2019-04-14 VITALS — BP 125/73 | HR 60 | Temp 98.3°F | Resp 16 | Wt 138.8 lb

## 2019-04-14 DIAGNOSIS — Z23 Encounter for immunization: Secondary | ICD-10-CM

## 2019-04-14 DIAGNOSIS — K51019 Ulcerative (chronic) pancolitis with unspecified complications: Secondary | ICD-10-CM

## 2019-04-14 NOTE — Progress Notes (Signed)
       Patient: Natasha Mueller Female    DOB: [birth year]   53 y.o.   MRN: [id number] Visit Date: 04/14/2019  Today's Provider: Trinna Post, PA-C   Chief Complaint  Patient presents with  . Immunizations   Subjective:     HPI Patient with history of ulcerative colitis about to start immunosuppressant Entyvio. Her GI doctors would like her to have the following vaccines: Prevnar, Pneumovax, Hep A, Hep B, Shingrix and annual flu.    Vaccine schedule will proceed as follows:   Today: Prevnar (1/1), Hep A (1/2), Hep B (1/3) and Shingrix (1/2)  4 weeks: Hep B (2/3) 8 weeks: Pneumovax (1/1), Shingrix (2/2) 6 months: Hep A (2/2) and Hep B (3/3)    Allergies  Allergen Reactions  . Hyoscyamine Sulfate Swelling    Tongue swelling     Current Outpatient Medications:  .  mercaptopurine (PURINETHOL) 50 MG tablet, Take 50 mg by mouth daily., Disp: , Rfl:  .  sertraline (ZOLOFT) 50 MG tablet, Take 1 tablet (50 mg total) by mouth daily for 14 days, THEN 1.5 tablets (75 mg total) daily., Disp: 145 tablet, Rfl: 0  Review of Systems  Constitutional: Negative.   Cardiovascular: Negative.   Gastrointestinal: Negative.     Social History   Tobacco Use  . Smoking status: Never Smoker  . Smokeless tobacco: Never Used  Substance Use Topics  . Alcohol use: No      Objective:   BP 125/73 (BP Location: Left Arm, Patient Position: Sitting, Cuff Size: Normal)   Pulse 60   Temp 98.3 F (36.8 C) (Oral)   Resp 16   Wt 138 lb 12.8 oz (63 kg)   LMP 06/14/2016 (Exact Date)   BMI 23.82 kg/m  Vitals:   04/14/19 1116  BP: 125/73  Pulse: 60  Resp: 16  Temp: 98.3 F (36.8 C)  TempSrc: Oral  Weight: 138 lb 12.8 oz (63 kg)     Physical Exam Constitutional:      Appearance: Normal appearance.  Cardiovascular:     Rate and Rhythm: Normal rate.  Pulmonary:     Effort: Pulmonary effort is normal. No respiratory distress.  Skin:    General: Skin is warm and dry.  Neurological:      Mental Status: She is alert and oriented to person, place, and time. Mental status is at baseline.  Psychiatric:        Mood and Affect: Mood normal.        Behavior: Behavior normal.         Assessment & Plan    1. Ulcerative pancolitis with complication ([place])  Follow up in 4 weeks for (2/3) Hep B vaccination.  2. Need for pneumococcal vaccination  - Pneumococcal conjugate vaccine 13-valent IM  3. Need for shingles vaccine  - Varicella-zoster vaccine IM  4. Need for hepatitis A immunization  - Hepatitis A vaccine adult IM  5. Need for hepatitis B vaccination  - Hepatitis B vaccine adult IM  The entirety of the information documented in the History of Present Illness, Review of Systems and Physical Exam were personally obtained by me. Portions of this information were initially documented by Lynford Humphrey, CMA and reviewed by me for thoroughness and accuracy.       Trinna Post, PA-C  [facility]

## 2019-04-14 NOTE — Patient Instructions (Signed)
Ulcerative Colitis, Adult  Ulcerative colitis is long-lasting (chronic) swelling (inflammation) of the large intestine (colon). Sores (ulcers) may also form on the colon. Ulcerative colitis is closely related to another condition of inflammation of the intestines that is called Crohn disease. Together, they are frequently referred to as inflammatory bowel disease (IBD). What are the causes? Ulcerative colitis is caused by increased activity of the immune system in the intestines. The immune system is the system that protects the body against harmful bacteria, viruses, fungi, and other things that can make you sick. When the immune system overacts, it causes inflammation. The cause of the increased immune system activity is not known. What increases the risk? Risk factors of ulcerative colitis include:  Age. This includes: ? Being 13-32 years old. ? Being older than 55 years old.  Having a family history of ulcerative colitis.  Being of Jewish descent. What are the signs or symptoms? Common symptoms of ulcerative colitis include rectal bleeding and diarrhea. There is a wide range of symptoms, and a person's symptoms depend on how severe the condition is. Additional symptoms may include:  Pain or cramping in the belly (abdomen).  Fever.  Fatigue.  Weight loss.  Night sweats.  Rectal pain.  Feeling the immediate need to have a bowel movement.  Nausea.  Loss of appetite.  Anemia.  Joint pain or soreness.  Eye irritation.  Certain skin rashes. How is this diagnosed? Ulcerative colitis may be diagnosed by:  Medical history and physical exam.  Blood tests and stool tests.  X-rays.  CT scans.  Colonoscopy. For this test, a flexible tube is inserted into your anus and your colon is examined.  Examination of a tissue sample from your colon (biopsy). How is this treated? Treatment for ulcerative colitis may include medicines to:  Decrease inflammation.  Control  your immune system. Surgery may also be necessary. Follow these instructions at home: Medicines and vitamins  Take medicines only as directed by your doctor. Do not take aspirin.  Ask your doctor if you should take any vitamins or supplements. Lifestyle  Exercise regularly.  Limit alcohol intake to no more than 1 drink per day for nonpregnant women and 2 drinks per day for men. One drink equals 12 ounces of beer, 5 ounces of wine, or 1 ounces of hard liquor. Eating and drinking  Drink enough fluid to keep your urine clear or pale yellow.  Ask your health care provider about the best diet for you. Follow the diet as directed by your health care provider. This may include: ? Avoiding carbonated drinks. ? Avoiding popcorn, vegetable skins, nuts, and other high-fiber foods when you have symptoms of ulcerative colitis. ? Eating smaller meals more often. ? Keeping a food diary. This may help you to find and avoid any foods that make you feel not well.  Limit your caffeine intake. General instructions  Keep all follow-up appointments as directed by your health care provider. This is important. Contact a health care provider if:  Your symptoms do not improve or get worse with treatment.  You continue to lose weight.  You have constant cramps or loose bowels.  You develop a new skin rash, skin sores, or eye problems.  You have a fever or chills. Get help right away if:  You have bloody diarrhea.  You have severe pain in your abdomen.  You vomit. This information is not intended to replace advice given to you by your health care provider. Make sure you discuss any  questions you have with your health care provider. Document Released: 07/24/2005 Document Revised: 06/16/2016 Document Reviewed: 02/06/2015 Elsevier Interactive Patient Education  [employer].

## 2019-04-27 ENCOUNTER — Other Ambulatory Visit: Payer: Self-pay | Admitting: Physician Assistant

## 2019-04-27 DIAGNOSIS — F419 Anxiety disorder, unspecified: Secondary | ICD-10-CM

## 2019-05-06 ENCOUNTER — Other Ambulatory Visit: Payer: Self-pay

## 2019-05-06 ENCOUNTER — Other Ambulatory Visit: Payer: Self-pay | Admitting: Gastroenterology

## 2019-05-06 ENCOUNTER — Ambulatory Visit (INDEPENDENT_AMBULATORY_CARE_PROVIDER_SITE_OTHER): Payer: Managed Care, Other (non HMO) | Admitting: Gastroenterology

## 2019-05-06 ENCOUNTER — Encounter: Payer: Self-pay | Admitting: Gastroenterology

## 2019-05-06 ENCOUNTER — Ambulatory Visit: Payer: BLUE CROSS/BLUE SHIELD | Admitting: Gastroenterology

## 2019-05-06 VITALS — BP 129/82 | HR 75 | Temp 98.3°F | Resp 17 | Ht 64.0 in | Wt 136.2 lb

## 2019-05-06 DIAGNOSIS — K5100 Ulcerative (chronic) pancolitis without complications: Secondary | ICD-10-CM | POA: Diagnosis not present

## 2019-05-06 NOTE — Progress Notes (Signed)
Natasha Darby, MD [address]  [place]  [place], [place] [address]  Main: [phone]  Fax: [phone]    Gastroenterology Consultation  Referring Provider:     Paulene Floor Primary Care Physician:  Paulene Floor Primary Gastroenterologist:  Dr. Cephas Mueller Reason for Consultation:     Ulcerative colitis        HPI:   Natasha Mueller is a 53 y.o. female referred by Dr. Trinna Post, PA-C  for consultation & management of UC  Ulcerative colitis diagnosed in 2006, previously on 5-ASA compounds without any response, switched to 6-MP which patient has been taking intermittently.  Patient was recently admitted to [facility] on 01/11/2019 due to abdominal pain, fever and CT revealed inflammation in her colon.  She was empirically started on antibiotics after stool studies came back negative for infection including C. difficile.  She was also found to have elevated CRP 13.7, ESR 60 as well as new onset of normocytic anemia.  Because she reported that she was steroid resistant in the past, she was not started on steroids on admission.  Due to persistent symptoms of abdominal pain, diarrhea started her on IV Solu-Medrol.  Subsequently, she underwent colonoscopy on 01/15/2019 which revealed mild to moderate continuous inflammation, severe in the right colon compared to left colon, advanced to transverse colon only due to severity of inflammation.  She was improving after discontinuation of antibiotics, she was transitioned to p.o. prednisone and was discharged home.  Follow-up visit 05/06/2019 Patient started [facility], received a second induction dose so far.  She reports doing well overall other than mild abdominal discomfort, 3-4 loose bowel movements daily.  She has been gaining weight.  She denies nausea, vomiting, rectal bleeding, fatigue.  She is receiving home infusion of Entyvio and tolerating it very well.  She stopped mercaptopurine and not on prednisone  IBD  diagnosis: Ulcerative colitis diagnosed in 2006  Disease course: -- 12/11/04: CC - abdominal pain & 10-15 episodes of diarrhea with frank blood per day x 3 months. -- 12/13/04: CSY - congested, erythematous, friable, granular, hemorrhagic, and ulcerated mucosa in rectum, sigmoid, splenic flexure, transverse, hepatic flexure, and ascending colon (path: active colitis with crypt abscess formation c/w IBD).  She was initially treated with Asacol and Lialda with no clinical response.  Later switched to 6-MP, initially 100 mg, gradually decreased to 50 mg as she said she was not tolerating it well.   She reports having intermittent symptoms for which she has been taking prednisone on and off as well as 6-MP as needed Entyvio started in 03/2019.  Off prednisone and 6-MP   Extra intestinal manifestations: None  IBD surgical history: None  Imaging:  MRE none CTE none CT abdomen and pelvis with contrast on 01/10/2019 1. Irregular thickening of the wall of the left transverse colon with mild associated inflammation, consistent with active inflammatory bowel disease. No abscess or extraluminal/free air. No evidence of bowel obstruction. Associated mild mesenteric adenopathy. 2. No other acute abnormality within the abdomen or pelvis. SBFT none  Procedures:  Colonoscopy -- 12/13/04: CSY - congested, erythematous, friable, granular, hemorrhagic, and ulcerated mucosa in rectum, sigmoid, splenic flexure, transverse, hepatic flexure, and ascending colon (path: active colitis with crypt abscess formation c/w IBD). -- 03/07/05: CSY - normal TI and cecum, 6-8 mm hyperplastic in transverse colon. -- 01/17/10: CSY - sigmoid/descending/ascending diverticulosis, pseudopolyps in splenic flexure, internal hemorrhoids (path: colonic mucosa w/o pathologic abnormalities). 11/10/13: CSY - normal  TI, 3 diminutive polyps at hepatic flexure (path: focal glandular hyperplasia and lymphoid aggregates), sigmoid  diverticulosis, small internal hemorrhoids (random bx path: colonic mucosa focal glandular hyperplasia and lymphoid aggregates);  01/15/2019 - Diffuse moderate inflammation was found in the transverse colon secondary to colitis. Biopsied. - Diffuse mild inflammation was found in the descending colon secondary to colitis. - The sigmoid colon is normal. Biopsied. - Diffuse mild inflammation was found in the rectum secondary to colitis. Biopsied. - Diverticulosis in the sigmoid colon, in the descending colon and in the transverse colon. DIAGNOSIS:  A. COLON, TRANSVERSE; COLD BIOPSY:  - CHRONIC COLITIS WITH SEVERE ACTIVITY (CRYPT DESTRUCTION, CRYPT  ABSCESSES, AND CRYPTITIS).  - NEGATIVE FOR GRANULOMA, DYSPLASIA, AND MALIGNANCY.   B. COLON, DESCENDING; COLD BIOPSY:  - CHRONIC COLITIS WITH MODERATE ACTIVITY (CRYPTITIS AND CRYPT  ABSCESSES).  - NEGATIVE FOR GRANULOMA, DYSPLASIA, AND MALIGNANCY.   C. COLON, SIGMOID; COLD BIOPSY:  - CHRONIC COLITIS WITHOUT ACTIVITY.  - NEGATIVE FOR GRANULOMA, DYSPLASIA, AND MALIGNANCY.   D. RECTUM; COLD BIOPSY:  - CHRONIC PROCTITIS WITH OUT ACTIVITY.  - NEGATIVE FOR GRANULOMA, DYSPLASIA, AND MALIGNANCY.   Upper Endoscopy 11/10/13: EGD - normal esophagus, single fundic gland polyp, normal duodenum.  Past Medical History:  Diagnosis Date   Allergy    BRCA negative 2013   with BART   Family history of breast cancer    BRCA/Bart neg 2013; IBIS=15%   UC (ulcerative colitis) ([place])     Past Surgical History:  Procedure Laterality Date   CESAREAN SECTION     COLONOSCOPY WITH PROPOFOL N/A 01/15/2019   Procedure: COLONOSCOPY WITH PROPOFOL;  Surgeon: Lucilla Lame, MD;  Location: [facility] ENDOSCOPY;  Service: Endoscopy;  Laterality: N/A;    Current Outpatient Medications:    sertraline (ZOLOFT) 50 MG tablet, TAKE 1 TABLET (50 MG TOTAL) BY MOUTH DAILY FOR 14 DAYS, THEN 1.5 TABLETS (75 MG TOTAL) DAILY., Disp: 45 tablet, Rfl: 3   mercaptopurine (PURINETHOL)  50 MG tablet, Take 50 mg by mouth daily., Disp: , Rfl:     Family History  Problem Relation Age of Onset   Breast cancer Mother 57   Hypertension Mother    Atrial fibrillation Father    Diabetes Sister    Breast cancer Sister    Atrial fibrillation Brother      Social History   Tobacco Use   Smoking status: Never Smoker   Smokeless tobacco: Never Used  Substance Use Topics   Alcohol use: No   Drug use: No    Allergies as of 05/06/2019 - Review Complete 05/06/2019  Allergen Reaction Noted   Hyoscyamine sulfate Swelling 03/13/2015    Review of Systems:    All systems reviewed and negative except where noted in HPI.   Physical Exam:  BP 129/82 (BP Location: Left Arm, Patient Position: Sitting, Cuff Size: Normal)    Pulse 75    Temp 98.3 F (36.8 C)    Resp 17    Ht 5' 4"  (1.626 m)    Wt 136 lb 3.2 oz (61.8 kg)    LMP 06/14/2016 (Exact Date)    BMI 23.38 kg/m  Patient's last menstrual period was 06/14/2016 (exact date).  General:   Alert,  Well-developed, well-nourished, pleasant and cooperative in NAD Head:  Normocephalic and atraumatic. Eyes:  Sclera clear, no icterus.   Conjunctiva pink. Ears:  Normal auditory acuity. Nose:  No deformity, discharge, or lesions. Mouth:  No deformity or lesions,oropharynx pink & moist. Neck:  Supple; no masses or  thyromegaly. Lungs:  Respirations even and unlabored.  Clear throughout to auscultation.   No wheezes, crackles, or rhonchi. No acute distress. Heart:  Regular rate and rhythm; no murmurs, clicks, rubs, or gallops. Abdomen:  Normal bowel sounds. Soft, non-tender and non-distended without masses, hepatosplenomegaly or hernias noted.  No guarding or rebound tenderness.   Rectal: Not performed Msk:  Symmetrical without gross deformities. Good, equal movement & strength bilaterally. Pulses:  Normal pulses noted. Extremities:  No clubbing or edema.  No cyanosis. Neurologic:  Alert and oriented x3;  grossly normal  neurologically. Skin:  Intact without significant lesions or rashes. No jaundice. Lymph Nodes:  No significant cervical adenopathy. Psych:  Alert and cooperative. Normal mood and affect.  Imaging Studies: Reviewed  Assessment and Plan:   Natasha Mueller is a 53 y.o. female with history of ulcerative pancolitis, moderately severe, previously maintained on 6-MP, colonoscopy in 12/2018 confirmed exacerbation of ulcerative colitis responsive to prednisone, started Entyvio monotherapy in 03/2019, currently in partial clinical remission.   Ulcerative colitis Continue Entyvio monotherapy Check labs today Discussed with her about colonoscopy in 3 months after she receives first maintenance dose  IBD Health Maintenance  1.TB status: QuantiFERON gold negative on 01/15/2019 2. Anemia: Normocytic anemia, check CBC, normal iron studies, B12 and folate levels 3.Immunizations: Hep A and B  received first dose, Influenza recommend annually flu vaccine, prevnar administered in 03/2019, pneumovax to be administered, Varicella unknown, Shingrix vaccine received first dose in 03/2019 4.Cancer screening I) Colon cancer/dysplasia surveillance: No evidence of dysplasia to date Recommend colonoscopy for dysplasia surveillance in 3 months II) Cervical cancer: Annual Pap smear up to date, reportedly negative III) Skin cancer - counseled about annual skin exam by dermatology and skin protection in summer using sun screen SPF > 72, clothing.  She has history of squamous cell carcinoma of the skin about 5 to 6 years ago.  Seen by dermatologist within last 1 year.  Recommend her to have annual surveillance of her skin by dermatologist 5.Bone health Vitamin D status:  Check vitamin D levels Bone density testing: Not done 5. Labs: Ordered today 6. Smoking: None 7. NSAIDs and Antibiotics use: None   Follow up in 3 months   Natasha Darby, MD

## 2019-05-07 LAB — COMPREHENSIVE METABOLIC PANEL
ALT: 27 IU/L (ref 0–32)
AST: 24 IU/L (ref 0–40)
Albumin/Globulin Ratio: 1.6 (ref 1.2–2.2)
Albumin: 4.5 g/dL (ref 3.8–4.9)
Alkaline Phosphatase: 74 IU/L (ref 39–117)
BUN/Creatinine Ratio: 20 (ref 9–23)
BUN: 13 mg/dL (ref 6–24)
Bilirubin Total: <0.2 mg/dL (ref 0.0–1.2)
CO2: 23 mmol/L (ref 20–29)
Calcium: 9.8 mg/dL (ref 8.7–10.2)
Chloride: 100 mmol/L (ref 96–106)
Creatinine, Ser: 0.66 mg/dL (ref 0.57–1.00)
GFR calc Af Amer: 115 mL/min/{1.73_m2} (ref >59)
GFR calc non Af Amer: 100 mL/min/{1.73_m2} (ref >59)
Globulin, Total: 2.9 g/dL (ref 1.5–4.5)
Glucose: 86 mg/dL (ref 65–99)
Potassium: 4.3 mmol/L (ref 3.5–5.2)
Sodium: 137 mmol/L (ref 134–144)
Total Protein: 7.4 g/dL (ref 6.0–8.5)

## 2019-05-07 LAB — C-REACTIVE PROTEIN: CRP: 1 mg/L (ref 0–10)

## 2019-05-07 LAB — CBC
Hematocrit: 38.4 % (ref 34.0–46.6)
Hemoglobin: 13.6 g/dL (ref 11.1–15.9)
MCH: 33.3 pg — ABNORMAL HIGH (ref 26.6–33.0)
MCHC: 35.4 g/dL (ref 31.5–35.7)
MCV: 94 fL (ref 79–97)
Platelets: 267 10*3/uL (ref 150–450)
RBC: 4.09 x10E6/uL (ref 3.77–5.28)
RDW: 12.9 % (ref 11.7–15.4)
WBC: 5.7 10*3/uL (ref 3.4–10.8)

## 2019-05-07 LAB — VITAMIN D 25 HYDROXY (VIT D DEFICIENCY, FRACTURES): Vit D, 25-Hydroxy: 35 ng/mL (ref 30.0–100.0)

## 2019-05-12 ENCOUNTER — Encounter: Payer: Self-pay | Admitting: Physician Assistant

## 2019-05-12 ENCOUNTER — Other Ambulatory Visit: Payer: Self-pay

## 2019-05-12 ENCOUNTER — Ambulatory Visit (INDEPENDENT_AMBULATORY_CARE_PROVIDER_SITE_OTHER): Payer: Managed Care, Other (non HMO) | Admitting: Physician Assistant

## 2019-05-12 VITALS — Wt 135.8 lb

## 2019-05-12 DIAGNOSIS — Z23 Encounter for immunization: Secondary | ICD-10-CM

## 2019-05-12 LAB — CALPROTECTIN, FECAL: Calprotectin, Fecal: <16 ug/g (ref 0–120)

## 2019-05-12 NOTE — Progress Notes (Signed)
Patient tolerated injection well.

## 2019-06-09 ENCOUNTER — Ambulatory Visit (INDEPENDENT_AMBULATORY_CARE_PROVIDER_SITE_OTHER): Payer: Managed Care, Other (non HMO) | Admitting: Physician Assistant

## 2019-06-09 ENCOUNTER — Other Ambulatory Visit: Payer: Self-pay

## 2019-06-09 DIAGNOSIS — Z23 Encounter for immunization: Secondary | ICD-10-CM | POA: Diagnosis not present

## 2019-06-09 NOTE — Progress Notes (Signed)
Patient comes in office today to receive vaccination for Pneumovax and Zoster. Patient reports that she is feeling well today and has no concerns or complaints to address.  Immunization History  Administered Date(s) Administered  . Hepatitis A, Adult 04/14/2019  . Hepatitis B, adult 04/14/2019, 05/12/2019  . PPD Test 07/11/2015  . Pneumococcal Conjugate-13 04/14/2019  . Pneumococcal Polysaccharide-23 06/09/2019  . Tdap 10/19/2018  . Zoster Recombinat (Shingrix) 04/14/2019, 06/09/2019

## 2019-06-10 NOTE — Patient Instructions (Signed)
Ulcerative Colitis, Adult  Ulcerative colitis is long-lasting (chronic) inflammation of the large intestine (colon) and rectum. Sores (ulcers) may also form in these areas. Ulcerative colitis, along with a closely related condition called Crohn's disease, is often referred to as inflammatory bowel disease (IBD). What are the causes? This condition may be caused by increased activity of the immune system in the intestines. The immune system is the system that protects the body against harmful bacteria, viruses, fungi, and other things that can make you sick. The cause of the increased activity of the immune system is not known. What increases the risk? The following factors may make you more likely to develop this condition:  Being 71-7 years old. The risk is also increased for people who are 55-55 years old.  Having a family history of ulcerative colitis.  Being of Jewish descent. What are the signs or symptoms? Symptoms vary depending on how severe the condition is. Common symptoms include:  Rectal bleeding.  Diarrhea, often with blood or pus in the stool. Other symptoms can include:  Pain or cramping in the abdomen.  Fever.  Fatigue.  Weight loss.  Night sweats.  Rectal pain.  A strong and sudden need to have a bowel movement (bowel urgency).  Nausea.  Loss of appetite.  Anemia.  Yellowing of the skin (jaundice) from liver dysfunction.  Joint pain or soreness.  Eye irritation.  Skin rashes. Symptoms can range from mild to severe. They may come and go. How is this diagnosed? This condition may be diagnosed based on:  Your symptoms and medical history.  A physical exam.  Tests, including: ? Blood tests and stool tests. ? X-ray. ? A CT scan. ? An MRI. ? Colonoscopy. For this test, a flexible tube is inserted into your anus, and your colon is examined. ? Biopsy. In this test, a tissue sample is taken from your colon and examined under a microscope. How  is this treated? Treatment for this condition may include medicines to:  Decrease swelling and inflammation.  Control your immune system.  Treat infections.  Relieve pain.  Control diarrhea. Severe flare-ups may need to be treated at a hospital. Treatment in a hospital may involve:  Resting the bowel. This involves not eating or drinking for a period of time.  Getting medicines through an IV.  Getting fluids and nutrition through: ? An IV. ? A tube that is passed through the nose and into the stomach (nasogastric tube, or NG tube).  Surgery to remove the affected part of the colon. This may be done if other treatments are not helping. This condition increases the risk of colon cancer. Adults with this condition will need to be watched for colon cancer throughout life. Follow these instructions at home: Medicines and vitamins  Take over-the-counter and prescription medicines only as told by your health care provider. Do not take aspirin.  If you were prescribed an antibiotic medicine, take it as told by your health care provider. Do not stop taking the antibiotic even if you start to feel better.  Ask your health care provider if you should take any vitamins or supplements. You may need to take: ? Calcium and vitamin D for bone health. ? Iron to help treat anemia. Lifestyle  Exercise regularly.  Work with your health care provider to manage your condition and educate yourself about your condition.  Do not use any products that contain nicotine or tobacco, such as cigarettes, e-cigarettes, and chewing tobacco. If you need help quitting,  ask your health care provider.  If you drink alcohol: ? Limit how much you use to:  0-1 drink a day for women.  0-2 drinks a day for men. ? Be aware of how much alcohol is in your drink. In the [place] one drink equals one 12 oz bottle of beer (355 mL), one 5 oz glass of wine (148 mL), or one 1 oz glass of hard liquor (44 mL). Eating and  drinking  Drink enough fluid to keep your urine pale yellow.  Ask your health care provider about the best diet for you. Follow the diet as told by your health care provider. This may include: ? Avoiding carbonated drinks. ? Avoiding popcorn, vegetable skins, nuts, and other high-fiber foods. ? Avoiding high-fat foods. ? Eating smaller meals more often. ? Limiting your intake of sugary drinks. ? Limiting your caffeine intake.  Follow food safety recommendations as told by your health care provider. This may include making sure you: ? Avoid eating raw or undercooked meat, fish, or eggs. ? Do not eat or drink spoiled or expired foods and drinks.  Keep a food diary. This may help you identify and avoid any foods that trigger your symptoms. General instructions  Wash your hands often with soap and water. If soap and water are not available, use hand sanitizer.  Stay up to date on your vaccinations, including a yearly (annual) flu shot. Ask your health care provider which vaccines you should get.  Follow recommendations from your health care provider for having cancer screening tests. Ulcerative colitis may place you at increased risk for colon cancer.  Keep all follow-up visits as told by your health care provider. This is important. Contact a health care provider if:  Your symptoms do not improve or they get worse with treatment.  You continue to lose weight.  You have constant cramps or loose stools.  You develop a new skin rash, skin sores, or eye problems.  You have a fever or chills. Get help right away if:  You have bloody diarrhea.  You have severe bleeding from the rectum.  You feel that your heart is racing (tachycardia).  You have severe pain in your abdomen.  Your abdomen swells (abdominal distension).  Your abdomen is tender to the touch.  You vomit. Summary  Ulcerative colitis is long-lasting (chronic) inflammation of the large intestine (colon) and  rectum. Sores (ulcers) may also form in these areas.  Follow instructions from your health care provider about medicines, lifestyle changes, and eating and drinking.  Contact your health care provider if symptoms do not improve or they get worse with treatment.  Get help right away if you have severe abdominal pain, abdominal swelling, or severe bleeding from the rectum.  Keep all follow-up visits as told by your health care provider. This is important. This information is not intended to replace advice given to you by your health care provider. Make sure you discuss any questions you have with your health care provider. Document Released: 07/24/2005 Document Revised: 08/03/2018 Document Reviewed: 08/05/2018 Elsevier Patient Education  2020 [employer].

## 2019-08-04 ENCOUNTER — Other Ambulatory Visit: Payer: Self-pay

## 2019-08-05 ENCOUNTER — Encounter: Payer: Self-pay | Admitting: Gastroenterology

## 2019-08-05 ENCOUNTER — Other Ambulatory Visit: Payer: Self-pay

## 2019-08-05 ENCOUNTER — Ambulatory Visit (INDEPENDENT_AMBULATORY_CARE_PROVIDER_SITE_OTHER): Payer: Managed Care, Other (non HMO) | Admitting: Gastroenterology

## 2019-08-05 VITALS — BP 135/64 | HR 109 | Temp 98.7°F | Wt 134.0 lb

## 2019-08-05 DIAGNOSIS — K5100 Ulcerative (chronic) pancolitis without complications: Secondary | ICD-10-CM | POA: Diagnosis not present

## 2019-08-05 DIAGNOSIS — F411 Generalized anxiety disorder: Secondary | ICD-10-CM | POA: Insufficient documentation

## 2019-08-05 MED ORDER — NA SULFATE-K SULFATE-MG SULF 17.5-3.13-1.6 GM/177ML PO SOLN: 354.0000 mL | Freq: Once | ORAL | 0 refills | Status: AC

## 2019-08-05 MED ORDER — FUSION PLUS PO CAPS: 1.0000 | ORAL_CAPSULE | Freq: Every day | ORAL | 0 refills | Status: AC

## 2019-08-05 NOTE — Progress Notes (Signed)
Natasha Darby, MD [address]  [place]  [place], [place] [address]  Main: [phone]  Fax: [phone]    Gastroenterology Consultation  Referring Provider:     Paulene Floor Primary Care Physician:  Paulene Floor Primary Gastroenterologist:  Dr. Cephas Mueller Reason for Consultation:     Ulcerative pancolitis        HPI:   Natasha Mueller is a 53 y.o. female referred by Dr. Trinna Post, PA-C  for consultation & management of UC  Ulcerative colitis diagnosed in 2006, previously on 5-ASA compounds without any response, switched to 6-MP which patient has been taking intermittently.  Patient was recently admitted to [facility] on 01/11/2019 due to abdominal pain, fever and CT revealed inflammation in her colon.  She was empirically started on antibiotics after stool studies came back negative for infection including C. difficile.  She was also found to have elevated CRP 13.7, ESR 60 as well as new onset of normocytic anemia.  Because she reported that she was steroid resistant in the past, she was not started on steroids on admission.  Due to persistent symptoms of abdominal pain, diarrhea started her on IV Solu-Medrol.  Subsequently, she underwent colonoscopy on 01/15/2019 which revealed mild to moderate continuous inflammation, severe in the right colon compared to left colon, advanced to transverse colon only due to severity of inflammation.  She was improving after discontinuation of antibiotics, she was transitioned to p.o. prednisone and was discharged home.  Follow-up visit 05/06/2019 Patient started [facility], received a second induction dose so far.  She reports doing well overall other than mild abdominal discomfort, 3-4 loose bowel movements daily.  She has been gaining weight.  She denies nausea, vomiting, rectal bleeding, fatigue.  She is receiving home infusion of Entyvio and tolerating it very well.  She stopped mercaptopurine and not on  prednisone  Follow-up visit 08/05/2019 Patient continues to take Entyvio.  Received first maintenance dose about a week ago.  She is tolerating infusions well.  She noticed that by end of week 6, she had increased bowel frequency, up to 4 bowel movements a day associated with mild abdominal discomfort.  She denied any blood in the stool.  She also noticed large joint arthralgias as well as severe bilateral hip pain.  After the infusion, her GI symptoms resolved.  Currently, she is not experiencing any arthralgias.  She was wondering if the medication was wearing off by week 6 and if she needs it at a closer interval.  She does report fatigue and started taking vitamin B12 daily.  She also notices that she takes some time for clearing of her vision in the morning.  She does have glasses but has not seen an ophthalmologist.  She denies double vision, pain in her eyes, redness of the eyes.  IBD diagnosis: Ulcerative colitis diagnosed in 2006  Disease course: -- 12/11/04: CC - abdominal pain & 10-15 episodes of diarrhea with frank blood per day x 3 months. -- 12/13/04: CSY - congested, erythematous, friable, granular, hemorrhagic, and ulcerated mucosa in rectum, sigmoid, splenic flexure, transverse, hepatic flexure, and ascending colon (path: active colitis with crypt abscess formation c/w IBD).  She was initially treated with Asacol and Lialda with no clinical response.  Later switched to 6-MP, initially 100 mg, gradually decreased to 50 mg as she said she was not tolerating it well.   She reports having intermittent symptoms for which she has been taking prednisone on  and off as well as 6-MP as needed --12/2018: Admitted to [facility] due to flareup of ulcerative colitis, colonoscopy confirmed moderate to severe pancolitis. Entyvio started in 03/2019.  Off prednisone and 6-MP   Extra intestinal manifestations: None  IBD surgical history: None  Imaging:  MRE none CTE none CT abdomen and pelvis with  contrast on 01/10/2019 1. Irregular thickening of the wall of the left transverse colon with mild associated inflammation, consistent with active inflammatory bowel disease. No abscess or extraluminal/free air. No evidence of bowel obstruction. Associated mild mesenteric adenopathy. 2. No other acute abnormality within the abdomen or pelvis. SBFT none  Procedures:  Colonoscopy -- 12/13/04: CSY - congested, erythematous, friable, granular, hemorrhagic, and ulcerated mucosa in rectum, sigmoid, splenic flexure, transverse, hepatic flexure, and ascending colon (path: active colitis with crypt abscess formation c/w IBD). -- 03/07/05: CSY - normal TI and cecum, 6-8 mm hyperplastic in transverse colon. -- 01/17/10: CSY - sigmoid/descending/ascending diverticulosis, pseudopolyps in splenic flexure, internal hemorrhoids (path: colonic mucosa w/o pathologic abnormalities). 11/10/13: CSY - normal TI, 3 diminutive polyps at hepatic flexure (path: focal glandular hyperplasia and lymphoid aggregates), sigmoid diverticulosis, small internal hemorrhoids (random bx path: colonic mucosa focal glandular hyperplasia and lymphoid aggregates);  01/15/2019 - Diffuse moderate inflammation was found in the transverse colon secondary to colitis. Biopsied. - Diffuse mild inflammation was found in the descending colon secondary to colitis. - The sigmoid colon is normal. Biopsied. - Diffuse mild inflammation was found in the rectum secondary to colitis. Biopsied. - Diverticulosis in the sigmoid colon, in the descending colon and in the transverse colon. DIAGNOSIS:  A. COLON, TRANSVERSE; COLD BIOPSY:  - CHRONIC COLITIS WITH SEVERE ACTIVITY (CRYPT DESTRUCTION, CRYPT  ABSCESSES, AND CRYPTITIS).  - NEGATIVE FOR GRANULOMA, DYSPLASIA, AND MALIGNANCY.   B. COLON, DESCENDING; COLD BIOPSY:  - CHRONIC COLITIS WITH MODERATE ACTIVITY (CRYPTITIS AND CRYPT  ABSCESSES).  - NEGATIVE FOR GRANULOMA, DYSPLASIA, AND MALIGNANCY.   C.  COLON, SIGMOID; COLD BIOPSY:  - CHRONIC COLITIS WITHOUT ACTIVITY.  - NEGATIVE FOR GRANULOMA, DYSPLASIA, AND MALIGNANCY.   D. RECTUM; COLD BIOPSY:  - CHRONIC PROCTITIS WITH OUT ACTIVITY.  - NEGATIVE FOR GRANULOMA, DYSPLASIA, AND MALIGNANCY.   Upper Endoscopy 11/10/13: EGD - normal esophagus, single fundic gland polyp, normal duodenum.  Past Medical History:  Diagnosis Date  . Allergy   . BRCA negative 2013   with BART  . Family history of breast cancer    BRCA/Bart neg 2013; IBIS=15%  . UC (ulcerative colitis) [facility])     Past Surgical History:  Procedure Laterality Date  . CESAREAN SECTION    . COLONOSCOPY WITH PROPOFOL N/A 01/15/2019   Procedure: COLONOSCOPY WITH PROPOFOL;  Surgeon: Lucilla Lame, MD;  Location: [facility] ENDOSCOPY;  Service: Endoscopy;  Laterality: N/A;    Current Outpatient Medications:  .  sertraline (ZOLOFT) 50 MG tablet, TAKE 1 TABLET (50 MG TOTAL) BY MOUTH DAILY FOR 14 DAYS, THEN 1.5 TABLETS (75 MG TOTAL) DAILY., Disp: 45 tablet, Rfl: 3 .  VEDOLIZUMAB IV, Inject into the vein., Disp: , Rfl:  .  Iron-FA-B Cmp-C-Biot-Probiotic (FUSION PLUS) CAPS, Take 1 capsule by mouth daily., Disp: 30 capsule, Rfl: 0 .  Na Sulfate-K Sulfate-Mg Sulf 17.5-3.13-1.6 GM/177ML SOLN, Take 354 mLs by mouth once for 1 dose., Disp: 354 mL, Rfl: 0    Family History  Problem Relation Age of Onset  . Breast cancer Mother 61  . Hypertension Mother   . Atrial fibrillation Father   . Diabetes Sister   . Breast cancer  Sister   . Atrial fibrillation Brother      Social History   Tobacco Use  . Smoking status: Never Smoker  . Smokeless tobacco: Never Used  Substance Use Topics  . Alcohol use: No  . Drug use: No    Allergies as of 08/05/2019 - Review Complete 08/05/2019  Allergen Reaction Noted  . Hyoscyamine sulfate Swelling 03/13/2015    Review of Systems:    All systems reviewed and negative except where noted in HPI.   Physical Exam:  BP 135/64 (BP Location: Left Arm,  Patient Position: Sitting, Cuff Size: Normal)   Pulse (!) 109   Temp 98.7 F (37.1 C) (Oral)   Wt 134 lb (60.8 kg)   LMP 06/14/2016 (Exact Date)   BMI 23.00 kg/m  Patient's last menstrual period was 06/14/2016 (exact date).  General:   Alert,  Well-developed, well-nourished, pleasant and cooperative in NAD Head:  Normocephalic and atraumatic. Eyes:  Sclera clear, no icterus.   Conjunctiva pink. Ears:  Normal auditory acuity. Nose:  No deformity, discharge, or lesions. Mouth:  No deformity or lesions,oropharynx pink & moist. Neck:  Supple; no masses or thyromegaly. Lungs:  Respirations even and unlabored.  Clear throughout to auscultation.   No wheezes, crackles, or rhonchi. No acute distress. Heart:  Regular rate and rhythm; no murmurs, clicks, rubs, or gallops. Abdomen:  Normal bowel sounds. Soft, non-tender and non-distended without masses, hepatosplenomegaly or hernias noted.  No guarding or rebound tenderness.   Rectal: Not performed Msk:  Symmetrical without gross deformities. Good, equal movement & strength bilaterally. Pulses:  Normal pulses noted. Extremities:  No clubbing or edema.  No cyanosis. Neurologic:  Alert and oriented x3;  grossly normal neurologically. Skin:  Intact without significant lesions or rashes. No jaundice. Psych:  Alert and cooperative. Normal mood and affect.  Imaging Studies: Reviewed  Assessment and Plan:   Natasha Mueller is a 53 y.o. female with history of ulcerative pancolitis since 2006, moderately severe, previously maintained on 6-MP, colonoscopy in 12/2018 confirmed exacerbation of ulcerative colitis responsive to prednisone, started Entyvio monotherapy in 03/2019, currently in clinical remission.  Patient does report mild breakthrough symptoms of abdominal pain and loose stools at week 6 before receiving her first maintenance dose at week 8.  Currently, asymptomatic  Ulcerative colitis Continue Entyvio monotherapy, decrease interval to every 6  weeks due to breakthrough symptoms at week 6 Check labs today Discussed with her about colonoscopy to assess response to [facility] and she is agreeable   IBD Health Maintenance  1.TB status: QuantiFERON gold negative on 01/15/2019 2. Anemia: No evidence of anemia, normal iron and B12 stores 3.Immunizations: Hep A and B  received second dose, Influenza recommend annually, prevnar administered in 03/2019, pneumovax administered in 05/2019, Varicella unknown, Shingrix vaccine received first dose in 03/2019, second dose in 05/2019 4.Cancer screening I) Colon cancer/dysplasia surveillance: No evidence of dysplasia to date Recommend colonoscopy for dysplasia surveillance with chromoendoscopy II) Cervical cancer: Annual Pap smear up to date, reportedly negative III) Skin cancer - counseled about annual skin exam by dermatology and skin protection in summer using sun screen SPF > 32, clothing.  She has history of squamous cell carcinoma of the skin about 5 to 6 years ago.  Seen by dermatologist within last 1 year.  Recommend her to have annual surveillance of her skin by dermatologist 5.Bone health Vitamin D status:  vitamin D levels normal Bone density testing: Not done 5. Labs: Ordered today 6. Smoking: None 7. NSAIDs and  Antibiotics use: None   Follow up in 3 months   Natasha Darby, MD

## 2019-08-06 ENCOUNTER — Ambulatory Visit: Payer: Managed Care, Other (non HMO) | Admitting: Gastroenterology

## 2019-08-06 LAB — CBC
Hematocrit: 40.3 % (ref 34.0–46.6)
Hemoglobin: 13.2 g/dL (ref 11.1–15.9)
MCH: 29.9 pg (ref 26.6–33.0)
MCHC: 32.8 g/dL (ref 31.5–35.7)
MCV: 91 fL (ref 79–97)
Platelets: 278 10*3/uL (ref 150–450)
RBC: 4.41 x10E6/uL (ref 3.77–5.28)
RDW: 12.2 % (ref 11.7–15.4)
WBC: 5.5 10*3/uL (ref 3.4–10.8)

## 2019-08-06 LAB — IRON,TIBC AND FERRITIN PANEL
Ferritin: 55 ng/mL (ref 15–150)
Iron Saturation: 42 % (ref 15–55)
Iron: 116 ug/dL (ref 27–159)
Total Iron Binding Capacity: 277 ug/dL (ref 250–450)
UIBC: 161 ug/dL (ref 131–425)

## 2019-08-06 LAB — COMPREHENSIVE METABOLIC PANEL
ALT: 17 IU/L (ref 0–32)
AST: 17 IU/L (ref 0–40)
Albumin/Globulin Ratio: 1.7 (ref 1.2–2.2)
Albumin: 4.5 g/dL (ref 3.8–4.9)
Alkaline Phosphatase: 65 IU/L (ref 39–117)
BUN/Creatinine Ratio: 18 (ref 9–23)
BUN: 13 mg/dL (ref 6–24)
Bilirubin Total: 0.3 mg/dL (ref 0.0–1.2)
CO2: 24 mmol/L (ref 20–29)
Calcium: 10 mg/dL (ref 8.7–10.2)
Chloride: 102 mmol/L (ref 96–106)
Creatinine, Ser: 0.74 mg/dL (ref 0.57–1.00)
GFR calc Af Amer: 105 mL/min/{1.73_m2} (ref >59)
GFR calc non Af Amer: 91 mL/min/{1.73_m2} (ref >59)
Globulin, Total: 2.7 g/dL (ref 1.5–4.5)
Glucose: 120 mg/dL — ABNORMAL HIGH (ref 65–99)
Potassium: 4 mmol/L (ref 3.5–5.2)
Sodium: 140 mmol/L (ref 134–144)
Total Protein: 7.2 g/dL (ref 6.0–8.5)

## 2019-08-06 LAB — C-REACTIVE PROTEIN: CRP: 1 mg/L (ref 0–10)

## 2019-08-06 LAB — VITAMIN B12: Vitamin B-12: 1260 pg/mL — ABNORMAL HIGH (ref 232–1245)

## 2019-08-24 ENCOUNTER — Other Ambulatory Visit: Payer: Self-pay

## 2019-08-24 ENCOUNTER — Other Ambulatory Visit
Admission: RE | Admit: 2019-08-24 | Discharge: 2019-08-24 | Disposition: A | Discharge status: Home / Self Care | Payer: Managed Care, Other (non HMO) | Source: Ambulatory Visit | Attending: Gastroenterology | Admitting: Gastroenterology

## 2019-08-24 DIAGNOSIS — Z20828 Contact with and (suspected) exposure to other viral communicable diseases: Secondary | ICD-10-CM | POA: Diagnosis not present

## 2019-08-24 DIAGNOSIS — Z01812 Encounter for preprocedural laboratory examination: Secondary | ICD-10-CM | POA: Diagnosis not present

## 2019-08-24 LAB — SARS CORONAVIRUS 2 (TAT 6-24 HRS): SARS Coronavirus 2: NEGATIVE

## 2019-08-27 ENCOUNTER — Ambulatory Visit
Admission: RE | Admit: 2019-08-27 | Discharge: 2019-08-27 | Disposition: A | Discharge status: Home / Self Care | Payer: Managed Care, Other (non HMO) | Attending: Gastroenterology | Admitting: Gastroenterology

## 2019-08-27 ENCOUNTER — Encounter: Payer: Self-pay | Admitting: *Deleted

## 2019-08-27 ENCOUNTER — Encounter
Admission: RE | Disposition: A | Discharge status: Home / Self Care | Payer: Self-pay | Source: Home / Self Care | Attending: Gastroenterology

## 2019-08-27 ENCOUNTER — Ambulatory Visit: Payer: Managed Care, Other (non HMO)

## 2019-08-27 ENCOUNTER — Other Ambulatory Visit: Payer: Self-pay

## 2019-08-27 DIAGNOSIS — K5140 Inflammatory polyps of colon without complications: Secondary | ICD-10-CM | POA: Insufficient documentation

## 2019-08-27 DIAGNOSIS — K5730 Diverticulosis of large intestine without perforation or abscess without bleeding: Secondary | ICD-10-CM | POA: Diagnosis not present

## 2019-08-27 DIAGNOSIS — Z1211 Encounter for screening for malignant neoplasm of colon: Secondary | ICD-10-CM | POA: Diagnosis not present

## 2019-08-27 DIAGNOSIS — D122 Benign neoplasm of ascending colon: Secondary | ICD-10-CM | POA: Diagnosis not present

## 2019-08-27 DIAGNOSIS — Z888 Allergy status to other drugs, medicaments and biological substances status: Secondary | ICD-10-CM | POA: Insufficient documentation

## 2019-08-27 DIAGNOSIS — K5100 Ulcerative (chronic) pancolitis without complications: Secondary | ICD-10-CM | POA: Diagnosis not present

## 2019-08-27 DIAGNOSIS — Z79899 Other long term (current) drug therapy: Secondary | ICD-10-CM | POA: Diagnosis not present

## 2019-08-27 DIAGNOSIS — F419 Anxiety disorder, unspecified: Secondary | ICD-10-CM | POA: Diagnosis not present

## 2019-08-27 DIAGNOSIS — K635 Polyp of colon: Secondary | ICD-10-CM

## 2019-08-27 HISTORY — PX: COLONOSCOPY WITH PROPOFOL: SHX5780

## 2019-08-27 SURGERY — COLONOSCOPY WITH PROPOFOL
Anesthesia: General

## 2019-08-27 MED ORDER — METHYLENE BLUE 1 % INJ SOLN
INTRAMUSCULAR | Status: AC
  Filled 2019-08-27: qty 10

## 2019-08-27 MED ORDER — PROPOFOL 500 MG/50ML IV EMUL
INTRAVENOUS | Status: DC | PRN
  Administered 2019-08-27: 140 ug/kg/min via INTRAVENOUS

## 2019-08-27 MED ORDER — LIDOCAINE HCL (CARDIAC) PF 100 MG/5ML IV SOSY
PREFILLED_SYRINGE | INTRAVENOUS | Status: DC | PRN
  Administered 2019-08-27: 50 mg via INTRAVENOUS

## 2019-08-27 MED ORDER — PROPOFOL 10 MG/ML IV BOLUS
INTRAVENOUS | Status: DC | PRN
  Administered 2019-08-27: 30 mg via INTRAVENOUS
  Administered 2019-08-27: 60 mg via INTRAVENOUS

## 2019-08-27 MED ORDER — PROPOFOL 500 MG/50ML IV EMUL
INTRAVENOUS | Status: AC
  Filled 2019-08-27: qty 50

## 2019-08-27 MED ORDER — SODIUM CHLORIDE (PF) 0.9 % IJ SOLN
INTRAVENOUS | Status: DC | PRN
  Administered 2019-08-27: 11:00:00 2 mL

## 2019-08-27 MED ORDER — SODIUM CHLORIDE 0.9 % IV SOLN
INTRAVENOUS | Status: DC
  Administered 2019-08-27: 10:00:00 1000 mL via INTRAVENOUS

## 2019-08-27 NOTE — Anesthesia Postprocedure Evaluation (Signed)
Anesthesia Post Note  Patient: Natasha Mueller  Procedure(s) Performed: COLONOSCOPY WITH PROPOFOL (N/A )  Patient location during evaluation: Endoscopy Anesthesia Type: General Level of consciousness: awake and alert and oriented Pain management: pain level controlled Vital Signs Assessment: post-procedure vital signs reviewed and stable Respiratory status: spontaneous breathing, nonlabored ventilation and respiratory function stable Cardiovascular status: blood pressure returned to baseline and stable Postop Assessment: no signs of nausea or vomiting Anesthetic complications: no     Last Vitals:  Vitals:   08/27/19 1206 08/27/19 1216  BP: 96/61 (!) 103/56  Pulse: 62 (!) 59  Resp: 12 15  Temp:    SpO2: 100% 100%    Last Pain:  Vitals:   08/27/19 1216  TempSrc:   PainSc: 0-No pain                 Jessamine Barcia

## 2019-08-27 NOTE — Anesthesia Preprocedure Evaluation (Signed)
Anesthesia Evaluation  Patient identified by MRN, date of birth, ID band Patient awake    Reviewed: Allergy & Precautions, NPO status , Patient's Chart, lab work & pertinent test results  History of Anesthesia Complications Negative for: history of anesthetic complications  Airway Mallampati: II  TM Distance: >3 FB Neck ROM: Full    Dental no notable dental hx.    Pulmonary neg pulmonary ROS, neg sleep apnea, neg COPD,    breath sounds clear to auscultation- rhonchi (-) wheezing      Cardiovascular Exercise Tolerance: Good (-) hypertension(-) CAD, (-) Past MI, (-) Cardiac Stents and (-) CABG  Rhythm:Regular Rate:Normal - Systolic murmurs and - Diastolic murmurs    Neuro/Psych neg Seizures PSYCHIATRIC DISORDERS Anxiety negative neurological ROS     GI/Hepatic Neg liver ROS, PUD,   Endo/Other  negative endocrine ROSneg diabetes  Renal/GU negative Renal ROS     Musculoskeletal negative musculoskeletal ROS (+)   Abdominal (+) - obese,   Peds  Hematology negative hematology ROS (+)   Anesthesia Other Findings Past Medical History: No date: Allergy 2013: BRCA negative     Comment:  with BART No date: Family history of breast cancer     Comment:  BRCA/Bart neg 2013; IBIS=15% No date: UC (ulcerative colitis) ([place])   Reproductive/Obstetrics                             Anesthesia Physical Anesthesia Plan  ASA: II  Anesthesia Plan: General   Post-op Pain Management:    Induction: Intravenous  PONV Risk Score and Plan: 2 and Propofol infusion  Airway Management Planned: Natural Airway  Additional Equipment:   Intra-op Plan:   Post-operative Plan:   Informed Consent: I have reviewed the patients History and Physical, chart, labs and discussed the procedure including the risks, benefits and alternatives for the proposed anesthesia with the patient or authorized representative who has  indicated his/her understanding and acceptance.     Dental advisory given  Plan Discussed with: CRNA and Anesthesiologist  Anesthesia Plan Comments:         Anesthesia Quick Evaluation

## 2019-08-27 NOTE — Op Note (Signed)
[facility] Gastroenterology Patient Name: Natasha Mueller Procedure Date: 08/27/2019 10:42 AM MRN: [id number] Account #: [id number] Date of Birth: [birth year] Admit Type: Outpatient Age: 53 Room: [facility] ENDO ROOM 3 Gender: Female Note Status: Finalized Procedure:            Colonoscopy Indications:          High risk colon cancer surveillance: Ulcerative                        pancolitis of 8 (or more) years duration, Incidental -                        Disease activity assessment of chronic ulcerative                        pancolitis, Incidental - Assess therapeutic response to                        therapy of chronic ulcerative pancolitis Providers:            Lin Landsman MD, MD Referring MD:         Wendee Beavers. Terrilee Croak (Referring MD) Medicines:            Monitored Anesthesia Care Complications:        No immediate complications. Estimated blood loss: None. Procedure:            Pre-Anesthesia Assessment:                       - Prior to the procedure, a History and Physical was                        performed, and patient medications and allergies were                        reviewed. The patient is competent. The risks and                        benefits of the procedure and the sedation options and                        risks were discussed with the patient. All questions                        were answered and informed consent was obtained.                        Patient identification and proposed procedure were                        verified by the physician, the nurse, the                        anesthesiologist, the anesthetist and the technician in                        the pre-procedure area in the procedure room in the                        endoscopy suite. Mental Status Examination: alert and  oriented. Airway Examination: normal oropharyngeal                        airway and neck mobility. Respiratory Examination:                       clear to auscultation. CV Examination: normal.                        Prophylactic Antibiotics: The patient does not require                        prophylactic antibiotics. Prior Anticoagulants: The                        patient has taken no previous anticoagulant or                        antiplatelet agents. ASA Grade Assessment: II - A                        patient with mild systemic disease. After reviewing the                        risks and benefits, the patient was deemed in                        satisfactory condition to undergo the procedure. The                        anesthesia plan was to use monitored anesthesia care                        (MAC). Immediately prior to administration of                        medications, the patient was re-assessed for adequacy                        to receive sedatives. The heart rate, respiratory rate,                        oxygen saturations, blood pressure, adequacy of                        pulmonary ventilation, and response to care were                        monitored throughout the procedure. The physical status                        of the patient was re-assessed after the procedure.                       After obtaining informed consent, the colonoscope was                        passed under direct vision. Throughout the procedure,                        the patient's blood pressure,  pulse, and oxygen                        saturations were monitored continuously. The                        Colonoscope was introduced through the anus and                        advanced to the the terminal ileum, with identification                        of the appendiceal orifice and IC valve. The                        colonoscopy was performed without difficulty. The                        patient tolerated the procedure well. The quality of                        the bowel preparation was evaluated using the BBPS                         [facility] Bowel Preparation Scale) with scores of: Right                        Colon = 3, Transverse Colon = 3 and Left Colon = 3                        (entire mucosa seen well with no residual staining,                        small fragments of stool or opaque liquid). The total                        BBPS score equals 9. Findings:      The perianal and digital rectal examinations were normal. Pertinent       negatives include normal sphincter tone and no palpable rectal lesions.      The terminal ileum appeared normal.      Normal mucosa was found in the entire colon. Staining chromoscopy with       methylene blue was performed using spray chromoendoscopy technique.      Two sessile polyps were found in the ascending colon. The polyps were       small in size. These polyps were removed with a cold snare. Resection       and retrieval were complete.      A few diverticula were found in the sigmoid colon and descending colon.      Scattered pseudopolyps were found in the sigmoid colon.      The retroflexed view of the distal rectum and anal verge was normal and       showed no anal or rectal abnormalities.      Inflammation was not found based on the endoscopic appearance of the       mucosa in the colon. This was graded as Mayo Score 0 (normal or inactive       disease), and when compared to the previous examination, the findings  are improved. Biopsies were taken with a cold forceps for histology from       right and left colon separately. Impression:           - The examined portion of the ileum was normal.                       - Normal mucosa in the entire examined colon.                        Chromoscopy performed. Biopsied.                       - Two small polyps in the ascending colon, removed with                        a cold snare. Resected and retrieved.                       - Diverticulosis in the sigmoid colon and in the                         descending colon.                       - Pseudopolyps in the descending colon.                       - The distal rectum and anal verge are normal on                        retroflexion view.                       - Inactive (Mayo Score 0) ulcerative colitis, in                        remission, improved since the last examination. Recommendation:       - Discharge patient to home (with escort).                       - Resume previous diet today.                       - Continue present medications.                       - Await pathology results.                       - Repeat colonoscopy in 3 years for surveillance based                        on pathology results.                       - Return to my office as previously scheduled. Procedure Code(s):    --- Professional ---                       [phone], Colonoscopy, flexible; with removal of tumor(s),  polyp(s), or other lesion(s) by snare technique                       [id number], 59, Colonoscopy, flexible; with biopsy, single                        or multiple Diagnosis Code(s):    --- Professional ---                       K51.00, Ulcerative (chronic) pancolitis without                        complications                       K63.5, Polyp of colon                       K51.40, Inflammatory polyps of colon without                        complications                       K57.30, Diverticulosis of large intestine without                        perforation or abscess without bleeding CPT copyright 2019 American Medical Association. All rights reserved. The codes documented in this report are preliminary and upon coder review may  be revised to meet current compliance requirements. Dr. Ulyess Mort Lin Landsman MD, MD 08/27/2019 11:39:18 AM This report has been signed electronically. Number of Addenda: 0 Note Initiated On: 08/27/2019 10:42 AM Scope Withdrawal Time: 0 hours 34 minutes 35 seconds  Total  Procedure Duration: 0 hours 41 minutes 26 seconds  Estimated Blood Loss: Estimated blood loss: none.      [facility]

## 2019-08-27 NOTE — Transfer of Care (Signed)
Immediate Anesthesia Transfer of Care Note  Patient: THU BAGGETT  Procedure(s) Performed: COLONOSCOPY WITH PROPOFOL (N/A )  Patient Location: PACU  Anesthesia Type:General  Level of Consciousness: sedated  Airway & Oxygen Therapy: Patient Spontanous Breathing and Patient connected to nasal cannula oxygen  Post-op Assessment: Report given to RN and Post -op Vital signs reviewed and stable  Post vital signs: Reviewed and stable  Last Vitals:  Vitals Value Taken Time  BP 92/56 08/27/19 1136  Temp    Pulse 81 08/27/19 1136  Resp 15 08/27/19 1136  SpO2 100 % 08/27/19 1136  Vitals shown include unvalidated device data.  Last Pain:  Vitals:   08/27/19 1136  TempSrc:   PainSc: Asleep         Complications: No apparent anesthesia complications

## 2019-08-27 NOTE — Anesthesia Post-op Follow-up Note (Signed)
Anesthesia QCDR form completed.        

## 2019-08-27 NOTE — H&P (Signed)
Cephas Darby, MD [address]  [place]  [place], [place] [address]  Main: [phone]  Fax: [phone] Pager: [phone]  Primary Care Physician:  Trinna Post, PA-C Primary Gastroenterologist:  Dr. Cephas Darby  Pre-Procedure History & Physical: HPI:  Natasha Mueller is a 53 y.o. female is here for an colonoscopy.   Past Medical History:  Diagnosis Date  . Allergy   . BRCA negative 2013   with BART  . Family history of breast cancer    BRCA/Bart neg 2013; IBIS=15%  . UC (ulcerative colitis) [facility])     Past Surgical History:  Procedure Laterality Date  . CESAREAN SECTION    . COLONOSCOPY WITH PROPOFOL N/A 01/15/2019   Procedure: COLONOSCOPY WITH PROPOFOL;  Surgeon: Lucilla Lame, MD;  Location: [facility] ENDOSCOPY;  Service: Endoscopy;  Laterality: N/A;    Prior to Admission medications   Medication Sig Start Date End Date Taking? Authorizing Provider  Iron-FA-B Cmp-C-Biot-Probiotic (FUSION PLUS) CAPS Take 1 capsule by mouth daily. 08/05/19 09/04/19  Lin Landsman, MD  sertraline (ZOLOFT) 50 MG tablet TAKE 1 TABLET (50 MG TOTAL) BY MOUTH DAILY FOR 14 DAYS, THEN 1.5 TABLETS (75 MG TOTAL) DAILY. 04/28/19 08/05/19  Trinna Post, PA-C  VEDOLIZUMAB IV Inject into the vein.    [provider]    Allergies as of 08/05/2019 - Review Complete 08/05/2019  Allergen Reaction Noted  . Hyoscyamine sulfate Swelling 03/13/2015    Family History  Problem Relation Age of Onset  . Breast cancer Mother 29  . Hypertension Mother   . Atrial fibrillation Father   . Diabetes Sister   . Breast cancer Sister   . Atrial fibrillation Brother     Social History   Socioeconomic History  . Marital status: Divorced    Spouse name: Not on file  . Number of children: Not on file  . Years of education: Not on file  . Highest education level: Not on file  Occupational History  . Not on file  Social Needs  . Financial resource strain: Not on file  . Food  insecurity    Worry: Not on file    Inability: Not on file  . Transportation needs    Medical: Not on file    Non-medical: Not on file  Tobacco Use  . Smoking status: Never Smoker  . Smokeless tobacco: Never Used  Substance and Sexual Activity  . Alcohol use: No  . Drug use: No  . Sexual activity: Never    Birth control/protection: Post-menopausal  Lifestyle  . Physical activity    Days per week: Not on file    Minutes per session: Not on file  . Stress: Not on file  Relationships  . Social Herbalist on phone: Not on file    Gets together: Not on file    Attends religious service: Not on file    Active member of club or organization: Not on file    Attends meetings of clubs or organizations: Not on file    Relationship status: Not on file  . Intimate partner violence    Fear of current or ex partner: Not on file    Emotionally abused: Not on file    Physically abused: Not on file    Forced sexual activity: Not on file  Other Topics Concern  . Not on file  Social History Narrative  . Not on file    Review of Systems: See HPI, otherwise negative  ROS  Physical Exam: BP (!) 103/56   Pulse (!) 59   Temp 98.2 F (36.8 C) (Tympanic)   Resp 15   Ht 5' 4"  (1.626 m)   Wt 125 lb 5 oz (56.8 kg)   LMP 06/14/2016 (Exact Date)   SpO2 100%   BMI 21.51 kg/m  General:   Alert,  pleasant and cooperative in NAD Head:  Normocephalic and atraumatic. Neck:  Supple; no masses or thyromegaly. Lungs:  Clear throughout to auscultation.    Heart:  Regular rate and rhythm. Abdomen:  Soft, nontender and nondistended. Normal bowel sounds, without guarding, and without rebound.   Neurologic:  Alert and  oriented x4;  grossly normal neurologically.  Impression/Plan: Natasha Mueller is here for an colonoscopy to be performed for history of ulcerative colitis, surveillance of colon dysplasia  Risks, benefits, limitations, and alternatives regarding  colonoscopy have been  reviewed with the patient.  Questions have been answered.  All parties agreeable.   Sherri Sear, MD  08/27/2019, 12:42 PM

## 2019-08-27 NOTE — Anesthesia Procedure Notes (Signed)
Date/Time: 08/27/2019 10:47 AM Performed by: Johnna Acosta, CRNA Pre-anesthesia Checklist: Patient identified, Emergency Drugs available, Suction available, Patient being monitored and Timeout performed Patient Re-evaluated:Patient Re-evaluated prior to induction Oxygen Delivery Method: Nasal cannula Preoxygenation: Pre-oxygenation with 100% oxygen Induction Type: IV induction

## 2019-08-30 ENCOUNTER — Encounter: Payer: Self-pay | Admitting: Gastroenterology

## 2019-08-30 LAB — SURGICAL PATHOLOGY

## 2019-09-13 ENCOUNTER — Telehealth: Payer: Self-pay | Admitting: Gastroenterology

## 2019-09-13 NOTE — Telephone Encounter (Signed)
Natasha Mueller  Registered nurse from [facility] left vm stating she saw pt last week for infusion and was unable to get her Labs drawn  Pt states she can get them done at [facility] or [employer] [facility] [facility] or at next infusion please call Remo Lipps at [employer] [phone]

## 2019-09-14 NOTE — Telephone Encounter (Signed)
Spoke with Mechele Claude nurse from [facility] and confirmed labs have been taken at office and are up to date.

## 2019-10-14 ENCOUNTER — Ambulatory Visit (INDEPENDENT_AMBULATORY_CARE_PROVIDER_SITE_OTHER): Payer: Managed Care, Other (non HMO) | Admitting: Physician Assistant

## 2019-10-14 ENCOUNTER — Other Ambulatory Visit: Payer: Self-pay

## 2019-10-14 DIAGNOSIS — Z23 Encounter for immunization: Secondary | ICD-10-CM | POA: Diagnosis not present

## 2019-10-14 NOTE — Progress Notes (Signed)
Nurse visit only. Administered 2nd dose of Hepatitis A vaccine. Patient was advised that she is due for the 3rd hepatitis B vaccine. Patient declined getting the 3rd hepatitis B vaccine today. She was offered a flu vaccine, which she also declined.

## 2019-10-15 NOTE — Patient Instructions (Signed)
Preventing Disease Through Immunization Immunization means developing a lower risk of getting a disease due to improvements in the body's disease-fighting system (immune system). Immunization can happen through:  Natural exposure to a disease.  Getting shots (vaccination). Vaccination involves putting a small amount of germs (vaccines) into the body. This may be done through one or more shots. Some vaccines can be given by mouth or as a nasal spray, instead of a shot. Vaccination helps to prevent:  Serious diseases such as polio, measles, and whooping cough.  Common infections, such as the flu. Vaccination starts at birth. Teens and adults also need vaccines regularly. Talk with your health care provider about the immunization schedule that is best for you. Some vaccines need to be repeated when you are older. How does immunization prevent disease? Immunization occurs when the body is exposed to germs that cause a certain disease. The body responds to this exposure by forming proteins (antibodies) to fight those germs. Germs in vaccines are dead or very weak, so they will not make you sick. However, the antibodies that your body makes will stay in your body for a long time. This improves the ability of your immune system to fight the germs in the future. If you get exposed to the germs again, you may be able to resist them (develop immunity against them). This is because your antibodies may be able to destroy the germs before you get sick. Why should I prevent diseases through immunization? Vaccines can protect you from getting diseases that can cause harmful complications and even death. Getting vaccinated also helps to keep other people healthy. If you are vaccinated, you cannot spread disease to others, and that can make the disease become less common. If people keep getting vaccinated, certain diseases may become rare or go away. If people stop getting vaccinated, certain diseases could become  more common. Not everyone can get a vaccine. Very young babies, people who are very sick, or older people may not be able to get vaccines. By getting immunized, you help to protect people who are not able to be vaccinated. Where to find more information To learn more about immunization, visit:  World Health Organization: https://www.davis-walter.com/  Centers for Disease Control and Prevention: http://www.murphy.com/ Summary  Immunization occurs when the body is exposed to germs that cause a certain disease and responds by forming proteins (antibodies) to fight those germs.  Getting vaccines is a safe and effective way to develop immunity against specific germs and the diseases that they cause.  Talk with your health care provider about your immunization schedule, and stay up to date with all of your shots. This information is not intended to replace advice given to you by your health care provider. Make sure you discuss any questions you have with your health care provider. Document Released: 12/06/2015 Document Revised: 02/05/2019 Document Reviewed: 06/22/2016 Elsevier Patient Education  2020 [employer].

## 2019-10-19 ENCOUNTER — Encounter: Payer: Self-pay | Admitting: Gastroenterology

## 2019-10-30 ENCOUNTER — Other Ambulatory Visit: Payer: Self-pay | Admitting: Physician Assistant

## 2019-10-30 DIAGNOSIS — F419 Anxiety disorder, unspecified: Secondary | ICD-10-CM

## 2019-12-21 ENCOUNTER — Ambulatory Visit
Admission: RE | Admit: 2019-12-21 | Discharge: 2019-12-21 | Disposition: A | Discharge status: Home / Self Care | Payer: Managed Care, Other (non HMO) | Source: Ambulatory Visit | Attending: Physician Assistant | Admitting: Physician Assistant

## 2019-12-21 ENCOUNTER — Encounter: Payer: Self-pay | Admitting: Physician Assistant

## 2019-12-21 ENCOUNTER — Other Ambulatory Visit: Payer: Self-pay

## 2019-12-21 ENCOUNTER — Ambulatory Visit (INDEPENDENT_AMBULATORY_CARE_PROVIDER_SITE_OTHER): Payer: Managed Care, Other (non HMO) | Admitting: Physician Assistant

## 2019-12-21 VITALS — BP 139/85 | HR 81 | Temp 96.9°F | Resp 16 | Ht 64.0 in | Wt 132.6 lb

## 2019-12-21 DIAGNOSIS — M25562 Pain in left knee: Secondary | ICD-10-CM | POA: Insufficient documentation

## 2019-12-21 DIAGNOSIS — M25559 Pain in unspecified hip: Secondary | ICD-10-CM

## 2019-12-21 DIAGNOSIS — M25561 Pain in right knee: Secondary | ICD-10-CM

## 2019-12-21 DIAGNOSIS — F419 Anxiety disorder, unspecified: Secondary | ICD-10-CM

## 2019-12-21 DIAGNOSIS — M79642 Pain in left hand: Secondary | ICD-10-CM

## 2019-12-21 DIAGNOSIS — R413 Other amnesia: Secondary | ICD-10-CM

## 2019-12-21 DIAGNOSIS — M2550 Pain in unspecified joint: Secondary | ICD-10-CM | POA: Diagnosis not present

## 2019-12-21 DIAGNOSIS — K5100 Ulcerative (chronic) pancolitis without complications: Secondary | ICD-10-CM | POA: Diagnosis not present

## 2019-12-21 DIAGNOSIS — M25521 Pain in right elbow: Secondary | ICD-10-CM

## 2019-12-21 DIAGNOSIS — Z0000 Encounter for general adult medical examination without abnormal findings: Secondary | ICD-10-CM | POA: Diagnosis not present

## 2019-12-21 DIAGNOSIS — M79641 Pain in right hand: Secondary | ICD-10-CM

## 2019-12-21 DIAGNOSIS — N951 Menopausal and female climacteric states: Secondary | ICD-10-CM | POA: Diagnosis not present

## 2019-12-21 DIAGNOSIS — M25522 Pain in left elbow: Secondary | ICD-10-CM | POA: Diagnosis present

## 2019-12-21 DIAGNOSIS — Z79890 Hormone replacement therapy (postmenopausal): Secondary | ICD-10-CM

## 2019-12-21 DIAGNOSIS — R61 Generalized hyperhidrosis: Secondary | ICD-10-CM

## 2019-12-21 MED ORDER — ESTRADIOL 0.5 MG PO TABS: 0.5000 mg | ORAL_TABLET | Freq: Every day | ORAL | 0 refills | Status: DC

## 2019-12-21 MED ORDER — PROGESTERONE MICRONIZED 100 MG PO CAPS: 100.0000 mg | ORAL_CAPSULE | Freq: Every day | ORAL | 0 refills | Status: DC

## 2019-12-21 NOTE — Progress Notes (Signed)
Patient: Natasha Mueller, Female    DOB: 05/05/1966, 54 y.o.   MRN: [id number] Visit Date: 12/21/2019  Today's Provider: Trinna Post, PA-C   Chief Complaint  Patient presents with  . Annual Exam   Subjective:  I [place] S. Dimas, CMA, am acting as Education administrator for [employer], PA-C.   Annual physical exam Natasha Mueller is a 54 y.o. female who presents today for health maintenance and complete physical. She feels well. She reports exercising no. She reports she is sleeping well.   Memory Loss: Reports she is having memory issues at work. She reports she is having difficulty with word recall. Concerned about performance at work. Wondering about hormones contributing to this. Has been occurring over the past two years. Reports difficulty recalling activities she did a minute ago and trouble remembering figures which has never happened to her before. She is not sleeping well.   Anxiety: Reports she is having some increased stress, especially in relation to memory issues. Was previously prescribed zoloft and had stopped taking this for six months. Her mood felt better but more recently she reports increased anxiety and so she started taking her zoloft yesterday.   Night Sweats: Reports intermittent night sweats but no measured fever several times per week. She had some night sweats since menopause   Ulcerative Colitis: She is being treated with Entyvio with [facility]. Recent colonoscopy 07/2019  Arthralgia: Notices this 9-10 days after taking entyvio. Reports her elbows, knees, hips ache and stiffen after sitting in one spot for a long time. She denies the issue being this bad before taking ulcerative colitis. She reports she did have some knee pain prior to UC diagnosis which did go away with 6MP. This can come and go.  ----------------------------------------------------------------- Last Pap: 08/07/2016-no abnormalities/HPV-negative in 2016. Last mammogram:11/18/2018 BI-RADS  1 Last colonoscopy:08/27/2019, Polyps, diverticulosis, recheck in 3 years.    Review of Systems  Constitutional: Positive for fatigue.  HENT: Negative.   Eyes: Negative.   Respiratory: Negative.   Cardiovascular: Negative.   Gastrointestinal: Negative.   Endocrine: Negative.   Genitourinary: Negative.   Musculoskeletal: Positive for arthralgias.  Skin: Negative.   Allergic/Immunologic: Negative.   Neurological: Negative.   Hematological: Negative.   Psychiatric/Behavioral: Negative.     Social History She  reports that she has never smoked. She has never used smokeless tobacco. She reports that she does not drink alcohol or use drugs. Social History   Socioeconomic History  . Marital status: Divorced    Spouse name: Not on file  . Number of children: Not on file  . Years of education: Not on file  . Highest education level: Not on file  Occupational History  . Not on file  Tobacco Use  . Smoking status: Never Smoker  . Smokeless tobacco: Never Used  Substance and Sexual Activity  . Alcohol use: No  . Drug use: No  . Sexual activity: Never    Birth control/protection: Post-menopausal  Other Topics Concern  . Not on file  Social History Narrative  . Not on file   Social Determinants of Health   Financial Resource Strain:   . Difficulty of Paying Living Expenses: Not on file  Food Insecurity:   . Worried About Charity fundraiser in the Last Year: Not on file  . Ran Out of Food in the Last Year: Not on file  Transportation Needs:   . Lack of Transportation (Medical): Not on file  .  Lack of Transportation (Non-Medical): Not on file  Physical Activity:   . Days of Exercise per Week: Not on file  . Minutes of Exercise per Session: Not on file  Stress:   . Feeling of Stress : Not on file  Social Connections:   . Frequency of Communication with Friends and Family: Not on file  . Frequency of Social Gatherings with Friends and Family: Not on file  . Attends  Religious Services: Not on file  . Active Member of Clubs or Organizations: Not on file  . Attends Archivist Meetings: Not on file  . Marital Status: Not on file    Patient Active Problem List   Diagnosis Date Noted  . Generalized anxiety disorder 08/05/2019  . Ulcerative pancolitis without complication [facility])     Past Surgical History:  Procedure Laterality Date  . CESAREAN SECTION    . COLONOSCOPY WITH PROPOFOL N/A 01/15/2019   Procedure: COLONOSCOPY WITH PROPOFOL;  Surgeon: Lucilla Lame, MD;  Location: [facility] ENDOSCOPY;  Service: Endoscopy;  Laterality: N/A;  . COLONOSCOPY WITH PROPOFOL N/A 08/27/2019   Procedure: COLONOSCOPY WITH PROPOFOL;  Surgeon: Lin Landsman, MD;  Location: [facility] ENDOSCOPY;  Service: Gastroenterology;  Laterality: N/A;    Family History  Family Status  Relation Name Status  . Mother  Alive  . Father  Alive  . Sister  Alive  . Brother  Alive  . Sister  Alive   Her family history includes Atrial fibrillation in her brother and father; Bladder Cancer (age of onset: 32) in her father; Breast cancer in her sister; Breast cancer (age of onset: 4) in her mother; Diabetes in her sister; Hypertension in her mother.     Allergies  Allergen Reactions  . Hyoscyamine Sulfate Swelling    Tongue swelling    Previous Medications   SERTRALINE (ZOLOFT) 50 MG TABLET    TAKE 1 TABLET (50 MG TOTAL) BY MOUTH DAILY FOR 14 DAYS, THEN 1.5 TABLETS (75 MG TOTAL) DAILY.   VEDOLIZUMAB IV    Inject into the vein.    Patient Care Team: Paulene Floor as PCP - General (Physician Assistant)      Objective:   Vitals: BP 139/85 (BP Location: Left Arm, Patient Position: Sitting, Cuff Size: Normal)   Pulse 81   Temp (!) 96.9 F (36.1 C) (Temporal)   Resp 16   Ht 5' 4"  (1.626 m)   Wt 132 lb 9.6 oz (60.1 kg)   LMP 06/14/2016 (Exact Date)   BMI 22.76 kg/m    Physical Exam Constitutional:      Appearance: Normal appearance.  Cardiovascular:      Rate and Rhythm: Normal rate and regular rhythm.     Heart sounds: Normal heart sounds.  Pulmonary:     Effort: Pulmonary effort is normal.     Breath sounds: Normal breath sounds.  Abdominal:     General: Bowel sounds are normal.     Palpations: Abdomen is soft.  Musculoskeletal:     Right elbow: Normal.     Left elbow: Normal.     Right hand: Normal. No swelling, deformity or tenderness. Normal range of motion.     Left hand: Normal. No swelling, deformity or tenderness. Normal range of motion.     Right knee: Normal. No crepitus.     Left knee: Normal. No crepitus.  Skin:    General: Skin is warm and dry.  Neurological:     Mental Status: She is alert and oriented  to person, place, and time. Mental status is at baseline.  Psychiatric:        Mood and Affect: Mood normal.        Behavior: Behavior normal.      Depression Screen PHQ 2/9 Scores 12/21/2019 10/19/2018  PHQ - 2 Score 2 0  PHQ- 9 Score 8 3      Assessment & Plan:     Routine Health Maintenance and Physical Exam  Exercise Activities and Dietary recommendations Goals   None     Immunization History  Administered Date(s) Administered  . Hepatitis A, Adult 04/14/2019, 10/14/2019  . Hepatitis B, adult 04/14/2019, 05/12/2019  . PPD Test 07/11/2015  . Pneumococcal Conjugate-13 04/14/2019  . Pneumococcal Polysaccharide-23 06/09/2019  . Tdap 10/19/2018  . Zoster Recombinat (Shingrix) 04/14/2019, 06/09/2019    Health Maintenance  Topic Date Due  . PAP SMEAR-Modifier  08/08/2019  . INFLUENZA VACCINE  01/26/2020 (Originally 05/29/2019)  . MAMMOGRAM  11/18/2020  . COLONOSCOPY  08/26/2022  . TETANUS/TDAP  10/19/2028  . Hepatitis C Screening  Completed  . HIV Screening  Completed     Discussed health benefits of physical activity, and encouraged her to engage in regular exercise appropriate for her age and condition.    1. Annual physical exam  We need to call her OBGYN and get PAP smear records,  recorded done on 2017. Mammogram ordered today and patient provided with [facility] contact card. She will need to get yearly mammograms as long as she is on hormone replacement.   - MM Digital Diagnostic Bilat; Future - Comprehensive Metabolic Panel (CMET) - CBC with Differential - TSH - Lipid Profile  2. Arthralgia, unspecified joint  DDx: inflammatory arthritis vs OA vs side effect from medications. She does not describe joint swelling, bogginess, or rash. Arthralgias are transient and not associated with morning stiffness. Suspect combination of OA and side effect from immunotherapy for UC. Xrays as below per patient preference.   3. Ulcerative pancolitis without complication ([place])  Continue treatment with GI.   4. Vasomotor symptoms due to menopause  Patient has an intact uterus and thus will need combination therapy. Discussed the increased risk of heart attack, blood clot, stroke and breast cancer. Discussed the necessity of regular mammograms to continue these medications. Discussed the importance of taking these medications together. Follow up in one month to assess effectiveness.   - estradiol (ESTRACE) 0.5 MG tablet; Take 1 tablet (0.5 mg total) by mouth daily.  Dispense: 90 tablet; Refill: 0 - progesterone (PROMETRIUM) 100 MG capsule; Take 1 capsule (100 mg total) by mouth daily.  Dispense: 90 capsule; Refill: 0  5. Pain of both elbows  - DG Elbow Complete Left; Future - DG Elbow Complete Right; Future  6. Hip pain  - DG Hip Unilat W OR W/O Pelvis 2-3 Views Left; Future - DG Hip Unilat W OR W/O Pelvis 2-3 Views Right; Future  7. Acute pain of both knees  - DG Knee Complete 4 Views Left; Future - DG Knee Complete 4 Views Right; Future  8. Pain in both hands  - DG Hand Complete Left; Future - DG Hand Complete Right; Future  9. Hormone replacement therapy  - estradiol (ESTRACE) 0.5 MG tablet; Take 1 tablet (0.5 mg total) by mouth daily.  Dispense: 90 tablet;  Refill: 0 - progesterone (PROMETRIUM) 100 MG capsule; Take 1 capsule (100 mg total) by mouth daily.  Dispense: 90 capsule; Refill: 0  10. Memory loss  Explained multiple causes of memory  loss including mood disorders, insomnia, traumatic injuries, dementia. Suspect this is a combination of multiple causes including anxiety, insomnia and we will reassess in one month after she has continuously taken zoloft and we have addressed night sweats.  11. Night sweats  12. Anxiety  Continue zoloft.   The entirety of the information documented in the History of Present Illness, Review of Systems and Physical Exam were personally obtained by me. Portions of this information were initially documented by [facility] and reviewed by me for thoroughness and accuracy.   F/u 1 month.  --------------------------------------------------------------------

## 2019-12-22 ENCOUNTER — Telehealth: Payer: Self-pay | Admitting: Physician Assistant

## 2019-12-22 LAB — CBC WITH DIFFERENTIAL/PLATELET
Basophils Absolute: 0.1 10*3/uL (ref 0.0–0.2)
Basos: 1 %
EOS (ABSOLUTE): 0.3 10*3/uL (ref 0.0–0.4)
Eos: 5 %
Hematocrit: 39.5 % (ref 34.0–46.6)
Hemoglobin: 13.8 g/dL (ref 11.1–15.9)
Immature Grans (Abs): 0 10*3/uL (ref 0.0–0.1)
Immature Granulocytes: 0 %
Lymphocytes Absolute: 1.6 10*3/uL (ref 0.7–3.1)
Lymphs: 25 %
MCH: 31.3 pg (ref 26.6–33.0)
MCHC: 34.9 g/dL (ref 31.5–35.7)
MCV: 90 fL (ref 79–97)
Monocytes Absolute: 0.4 10*3/uL (ref 0.1–0.9)
Monocytes: 7 %
Neutrophils Absolute: 3.8 10*3/uL (ref 1.4–7.0)
Neutrophils: 62 %
Platelets: 302 10*3/uL (ref 150–450)
RBC: 4.41 x10E6/uL (ref 3.77–5.28)
RDW: 12.4 % (ref 11.7–15.4)
WBC: 6.2 10*3/uL (ref 3.4–10.8)

## 2019-12-22 LAB — LIPID PANEL
Chol/HDL Ratio: 3.2 ratio (ref 0.0–4.4)
Cholesterol, Total: 246 mg/dL — ABNORMAL HIGH (ref 100–199)
HDL: 77 mg/dL (ref >39)
LDL Chol Calc (NIH): 153 mg/dL — ABNORMAL HIGH (ref 0–99)
Triglycerides: 92 mg/dL (ref 0–149)
VLDL Cholesterol Cal: 16 mg/dL (ref 5–40)

## 2019-12-22 LAB — COMPREHENSIVE METABOLIC PANEL
ALT: 9 IU/L (ref 0–32)
AST: 13 IU/L (ref 0–40)
Albumin/Globulin Ratio: 1.5 (ref 1.2–2.2)
Albumin: 4.6 g/dL (ref 3.8–4.9)
Alkaline Phosphatase: 72 IU/L (ref 39–117)
BUN/Creatinine Ratio: 17 (ref 9–23)
BUN: 12 mg/dL (ref 6–24)
Bilirubin Total: <0.2 mg/dL (ref 0.0–1.2)
CO2: 23 mmol/L (ref 20–29)
Calcium: 9.6 mg/dL (ref 8.7–10.2)
Chloride: 102 mmol/L (ref 96–106)
Creatinine, Ser: 0.72 mg/dL (ref 0.57–1.00)
GFR calc Af Amer: 108 mL/min/{1.73_m2} (ref >59)
GFR calc non Af Amer: 94 mL/min/{1.73_m2} (ref >59)
Globulin, Total: 3.1 g/dL (ref 1.5–4.5)
Glucose: 94 mg/dL (ref 65–99)
Potassium: 3.9 mmol/L (ref 3.5–5.2)
Sodium: 141 mmol/L (ref 134–144)
Total Protein: 7.7 g/dL (ref 6.0–8.5)

## 2019-12-22 LAB — TSH: TSH: 0.529 u[IU]/mL (ref 0.450–4.500)

## 2019-12-22 NOTE — Telephone Encounter (Signed)
Reviewed PAP smear and she will be due in 08/2020. Encourage her to schedule with her OBGYN at [facility] if she still plans to see them or we may do it for her here at her upcoming visit.

## 2019-12-22 NOTE — Patient Instructions (Signed)
Health Maintenance, Female Adopting a healthy lifestyle and getting preventive care are important in promoting health and wellness. Ask your health care provider about:  The right schedule for you to have regular tests and exams.  Things you can do on your own to prevent diseases and keep yourself healthy. What should I know about diet, weight, and exercise? Eat a healthy diet   Eat a diet that includes plenty of vegetables, fruits, low-fat dairy products, and lean protein.  Do not eat a lot of foods that are high in solid fats, added sugars, or sodium. Maintain a healthy weight Body mass index (BMI) is used to identify weight problems. It estimates body fat based on height and weight. Your health care provider can help determine your BMI and help you achieve or maintain a healthy weight. Get regular exercise Get regular exercise. This is one of the most important things you can do for your health. Most adults should:  Exercise for at least 150 minutes each week. The exercise should increase your heart rate and make you sweat (moderate-intensity exercise).  Do strengthening exercises at least twice a week. This is in addition to the moderate-intensity exercise.  Spend less time sitting. Even light physical activity can be beneficial. Watch cholesterol and blood lipids Have your blood tested for lipids and cholesterol at 56 years of age, then have this test every 5 years. Have your cholesterol levels checked more often if:  Your lipid or cholesterol levels are high.  You are older than 56 years of age.  You are at high risk for heart disease. What should I know about cancer screening? Depending on your health history and family history, you may need to have cancer screening at various ages. This may include screening for:  Breast cancer.  Cervical cancer.  Colorectal cancer.  Skin cancer.  Lung cancer. What should I know about heart disease, diabetes, and high blood  pressure? Blood pressure and heart disease  High blood pressure causes heart disease and increases the risk of stroke. This is more likely to develop in people who have high blood pressure readings, are of African descent, or are overweight.  Have your blood pressure checked: ? Every 3-5 years if you are 18-39 years of age. ? Every year if you are 40 years old or older. Diabetes Have regular diabetes screenings. This checks your fasting blood sugar level. Have the screening done:  Once every three years after age 40 if you are at a normal weight and have a low risk for diabetes.  More often and at a younger age if you are overweight or have a high risk for diabetes. What should I know about preventing infection? Hepatitis B If you have a higher risk for hepatitis B, you should be screened for this virus. Talk with your health care provider to find out if you are at risk for hepatitis B infection. Hepatitis C Testing is recommended for:  Everyone born from 1945 through 1965.  Anyone with known risk factors for hepatitis C. Sexually transmitted infections (STIs)  Get screened for STIs, including gonorrhea and chlamydia, if: ? You are sexually active and are younger than 56 years of age. ? You are older than 56 years of age and your health care provider tells you that you are at risk for this type of infection. ? Your sexual activity has changed since you were last screened, and you are at increased risk for chlamydia or gonorrhea. Ask your health care provider if   you are at risk.  Ask your health care provider about whether you are at high risk for HIV. Your health care provider may recommend a prescription medicine to help prevent HIV infection. If you choose to take medicine to prevent HIV, you should first get tested for HIV. You should then be tested every 3 months for as long as you are taking the medicine. Pregnancy  If you are about to stop having your period (premenopausal) and  you may become pregnant, seek counseling before you get pregnant.  Take 400 to 800 micrograms (mcg) of folic acid every day if you become pregnant.  Ask for birth control (contraception) if you want to prevent pregnancy. Osteoporosis and menopause Osteoporosis is a disease in which the bones lose minerals and strength with aging. This can result in bone fractures. If you are 65 years old or older, or if you are at risk for osteoporosis and fractures, ask your health care provider if you should:  Be screened for bone loss.  Take a calcium or vitamin D supplement to lower your risk of fractures.  Be given hormone replacement therapy (HRT) to treat symptoms of menopause. Follow these instructions at home: Lifestyle  Do not use any products that contain nicotine or tobacco, such as cigarettes, e-cigarettes, and chewing tobacco. If you need help quitting, ask your health care provider.  Do not use street drugs.  Do not share needles.  Ask your health care provider for help if you need support or information about quitting drugs. Alcohol use  Do not drink alcohol if: ? Your health care provider tells you not to drink. ? You are pregnant, may be pregnant, or are planning to become pregnant.  If you drink alcohol: ? Limit how much you use to 0-1 drink a day. ? Limit intake if you are breastfeeding.  Be aware of how much alcohol is in your drink. In the [place] one drink equals one 12 oz bottle of beer (355 mL), one 5 oz glass of wine (148 mL), or one 1 oz glass of hard liquor (44 mL). General instructions  Schedule regular health, dental, and eye exams.  Stay current with your vaccines.  Tell your health care provider if: ? You often feel depressed. ? You have ever been abused or do not feel safe at home. Summary  Adopting a healthy lifestyle and getting preventive care are important in promoting health and wellness.  Follow your health care provider's instructions about healthy  diet, exercising, and getting tested or screened for diseases.  Follow your health care provider's instructions on monitoring your cholesterol and blood pressure. This information is not intended to replace advice given to you by your health care provider. Make sure you discuss any questions you have with your health care provider. Document Revised: 10/07/2018 Document Reviewed: 10/07/2018 Elsevier Patient Education  2020 Elsevier Inc.  

## 2019-12-23 ENCOUNTER — Ambulatory Visit
Admission: RE | Admit: 2019-12-23 | Discharge: 2019-12-23 | Disposition: A | Discharge status: Home / Self Care | Payer: Managed Care, Other (non HMO) | Source: Ambulatory Visit | Attending: Physician Assistant | Admitting: Physician Assistant

## 2019-12-23 DIAGNOSIS — Z1231 Encounter for screening mammogram for malignant neoplasm of breast: Secondary | ICD-10-CM | POA: Insufficient documentation

## 2019-12-23 DIAGNOSIS — Z0000 Encounter for general adult medical examination without abnormal findings: Secondary | ICD-10-CM

## 2019-12-23 NOTE — Telephone Encounter (Signed)
Called to advise patient, LVMTCB.

## 2019-12-28 NOTE — Telephone Encounter (Signed)
Patient was advised of message and states that she will have pap done at her office visit with you on 01/12/2020. FYI

## 2020-01-12 ENCOUNTER — Ambulatory Visit (INDEPENDENT_AMBULATORY_CARE_PROVIDER_SITE_OTHER): Payer: Managed Care, Other (non HMO) | Admitting: Physician Assistant

## 2020-01-12 ENCOUNTER — Other Ambulatory Visit: Payer: Self-pay

## 2020-01-12 ENCOUNTER — Other Ambulatory Visit (HOSPITAL_COMMUNITY)
Admission: RE | Admit: 2020-01-12 | Discharge: 2020-01-12 | Disposition: A | Discharge status: Home / Self Care | Payer: Managed Care, Other (non HMO) | Source: Ambulatory Visit | Attending: Physician Assistant | Admitting: Physician Assistant

## 2020-01-12 ENCOUNTER — Encounter: Payer: Self-pay | Admitting: Physician Assistant

## 2020-01-12 VITALS — BP 120/82 | HR 65 | Temp 96.8°F | Wt 133.0 lb

## 2020-01-12 DIAGNOSIS — Z124 Encounter for screening for malignant neoplasm of cervix: Secondary | ICD-10-CM

## 2020-01-12 DIAGNOSIS — Z79890 Hormone replacement therapy (postmenopausal): Secondary | ICD-10-CM

## 2020-01-12 NOTE — Progress Notes (Signed)
Patient: Natasha Mueller Female    DOB: 11-18-1965   54 y.o.   MRN: [id number] Visit Date: 01/12/2020  Today's Provider: Trinna Post, PA-C   Chief Complaint  Patient presents with  . Follow-up   Subjective:     HPI  Follow-up  Vasomotor symptoms due to menopause Patient presents today for a follow-up visit. Patient was started on estradiol 0.5 MG tablet and progesterone 100 MG capsule for hormone replacement therapy. Patient reports improvement in her symptoms.  She has noticed a near elimination in night sweats. She would like to continue this medication. Her mammogram was normal in 12/23/2019. She is also feeling better after she restarted zoloft.   She is due for a PAP smear today.   Allergies  Allergen Reactions  . Hyoscyamine Sulfate Swelling    Tongue swelling     Current Outpatient Medications:  .  estradiol (ESTRACE) 0.5 MG tablet, Take 1 tablet (0.5 mg total) by mouth daily., Disp: 90 tablet, Rfl: 0 .  progesterone (PROMETRIUM) 100 MG capsule, Take 1 capsule (100 mg total) by mouth daily., Disp: 90 capsule, Rfl: 0 .  sertraline (ZOLOFT) 50 MG tablet, TAKE 1 TABLET (50 MG TOTAL) BY MOUTH DAILY FOR 14 DAYS, THEN 1.5 TABLETS (75 MG TOTAL) DAILY., Disp: 45 tablet, Rfl: 3 .  VEDOLIZUMAB IV, Inject into the vein., Disp: , Rfl:   Review of Systems  Constitutional: Negative.   Respiratory: Negative.   Genitourinary: Negative.   Psychiatric/Behavioral: Negative.     Social History   Tobacco Use  . Smoking status: Never Smoker  . Smokeless tobacco: Never Used  Substance Use Topics  . Alcohol use: No      Objective:   BP 120/82 (BP Location: Left Arm, Patient Position: Sitting, Cuff Size: Normal)   Pulse 65   Temp (!) 96.8 F (36 C) (Temporal)   Wt 133 lb (60.3 kg)   LMP 06/14/2016 (Exact Date)   BMI 22.83 kg/m  Vitals:   01/12/20 1331  BP: 120/82  Pulse: 65  Temp: (!) 96.8 F (36 C)  TempSrc: Temporal  Weight: 133 lb (60.3 kg)  Body mass  index is 22.83 kg/m.   Physical Exam Constitutional:      Appearance: Normal appearance. She is normal weight.  Cardiovascular:     Rate and Rhythm: Normal rate and regular rhythm.  Genitourinary:    Vagina: Normal.     Cervix: Normal.  Skin:    General: Skin is warm and dry.  Neurological:     Mental Status: She is alert and oriented to person, place, and time. Mental status is at baseline.  Psychiatric:        Mood and Affect: Mood normal.        Behavior: Behavior normal.      No results found for any visits on 01/12/20.     Assessment & Plan    1. Cervical cancer screening  If normal repeat in 5 years.   - Cytology - PAP  2. Hormone replacement therapy  Continue. Will see her once yearly for follow up and screenings.   The entirety of the information documented in the History of Present Illness, Review of Systems and Physical Exam were personally obtained by me. Portions of this information were initially documented by Ashley Royalty, CMA and reviewed by me for thoroughness and accuracy.       Trinna Post, PA-C  [facility]

## 2020-01-17 LAB — CYTOLOGY - PAP
Comment: NEGATIVE
Diagnosis: NEGATIVE
High risk HPV: NEGATIVE

## 2020-01-18 ENCOUNTER — Telehealth: Payer: Self-pay

## 2020-01-18 NOTE — Telephone Encounter (Signed)
-----   Message from Mar Daring, PA-C sent at 01/17/2020  1:20 PM EDT ----- Pap is normal, HPV negative.  Will repeat in 5 years.

## 2020-01-18 NOTE — Telephone Encounter (Signed)
[employer]   Result Notes and Comments to Patient Comment seen by patient Nancy Nordmann on 01/17/2020 1:20 PM EDT

## 2020-02-28 ENCOUNTER — Other Ambulatory Visit: Payer: Self-pay

## 2020-02-28 MED ORDER — VEDOLIZUMAB 300 MG IV SOLR: 300.0000 mg | INTRAVENOUS | 4 refills | Status: DC

## 2020-02-28 NOTE — Telephone Encounter (Signed)
Last office visit Ulcerative Pancolitis 08/05/2019 Last refill 08/05/2019  Plan from last visit:Continue Entyvio monotherapy, decrease interval to every 6 weeks due to breakthrough symptoms at week 6 By refill request patient is getting every 8 weeks

## 2020-03-07 ENCOUNTER — Other Ambulatory Visit: Payer: Self-pay | Admitting: Physician Assistant

## 2020-03-07 DIAGNOSIS — N951 Menopausal and female climacteric states: Secondary | ICD-10-CM

## 2020-03-07 DIAGNOSIS — Z79890 Hormone replacement therapy (postmenopausal): Secondary | ICD-10-CM

## 2020-03-07 NOTE — Telephone Encounter (Signed)
Requested medication (s) are due for refill today: yes  Requested medication (s) are on the active medication list: yes  Last refill:  12/21/19 for both  Future visit scheduled: no  Notes to clinic:  both prescriptions expired 5/54/21   Requested Prescriptions  Pending Prescriptions Disp Refills   estradiol (ESTRACE) 0.5 MG tablet [Pharmacy Med Name: ESTRADIOL 0.5 MG TABLET] 90 tablet 0    Sig: TAKE 1 TABLET BY MOUTH EVERY DAY      OB/GYN:  Estrogens Passed - 03/07/2020  9:08 AM      Passed - Mammogram is up-to-date per Health Maintenance      Passed - Last BP in normal range    BP Readings from Last 1 Encounters:  01/12/20 120/82          Passed - Valid encounter within last 12 months    Recent Outpatient Visits           1 month ago Cervical cancer screening   [facility] [place], Wendee Beavers, [place]   2 months ago Annual physical exam   [facility] [place] [facility] Carles Collet M, [place]   9 months ago Need for shingles vaccine   [facility] Carles Collet M, [place]   10 months ago Need for hepatitis B vaccination   [facility] [place], Wendee Beavers, [place]   1 year ago Hospital discharge follow-up   [place], Adriana M, PA-C                progesterone ([place]) 100 MG capsule [Pharmacy Med Name: PROGESTERONE 100 MG CAPSULE] 90 capsule     Sig: TAKE 1 CAPSULE BY MOUTH EVERY DAY      OB/GYN:  Progestins Passed - 03/07/2020  9:08 AM      Passed - Valid encounter within last 12 months    Recent Outpatient Visits           1 month ago Cervical cancer screening   [facility] Trinna Post, [place]   2 months ago Annual physical exam   [facility] Carles Collet M, [place]   9 months ago Need for shingles vaccine   [facility] Carles Collet M, [place]   10 months ago Need for hepatitis B vaccination   [facility] [place], Wendee Beavers, [place]   1  year ago [facility] discharge follow-up   [place], [place], [place]

## 2020-03-07 NOTE — Telephone Encounter (Signed)
L.O.V. was 01/12/2020.

## 2020-03-18 ENCOUNTER — Other Ambulatory Visit: Payer: Self-pay | Admitting: Physician Assistant

## 2020-03-18 DIAGNOSIS — F419 Anxiety disorder, unspecified: Secondary | ICD-10-CM

## 2020-03-18 NOTE — Telephone Encounter (Signed)
Requested medication (s) are due for refill today: yes  Requested medication (s) are on the active medication list: no  Last refill:  10/31/19 #45 with 3 refills  Future visit scheduled: no  Notes to clinic:  Please review for refill. Prescription expired on 01/29/20    Requested Prescriptions  Pending Prescriptions Disp Refills   sertraline (ZOLOFT) 50 MG tablet [Pharmacy Med Name: SERTRALINE HCL 50 MG TABLET] 45 tablet 3    Sig: TAKE 1 TABLET (50 MG TOTAL) BY MOUTH DAILY FOR 14 DAYS, THEN 1.5 TABLETS (75 MG TOTAL) DAILY.      Psychiatry:  Antidepressants - SSRI Passed - 03/18/2020 12:47 AM      Passed - Valid encounter within last 6 months    Recent Outpatient Visits           2 months ago Cervical cancer screening   [facility] Trinna Post, [place]   2 months ago Annual physical exam   [facility] Carles Collet M, [place]   9 months ago Need for shingles vaccine   [place], [place], [place]   10 months ago Need for hepatitis B vaccination   [facility] [place], Wendee Beavers, [place]   1 year ago [facility] discharge follow-up   [place], Wendee Beavers, [place]

## 2020-03-21 NOTE — Telephone Encounter (Signed)
L.O.V. was on 01/12/2020 and no upcoming appointment. Please advise.

## 2020-05-24 ENCOUNTER — Other Ambulatory Visit: Payer: Self-pay

## 2020-06-04 ENCOUNTER — Other Ambulatory Visit: Payer: Self-pay | Admitting: Physician Assistant

## 2020-06-04 DIAGNOSIS — N951 Menopausal and female climacteric states: Secondary | ICD-10-CM

## 2020-06-04 DIAGNOSIS — Z79890 Hormone replacement therapy (postmenopausal): Secondary | ICD-10-CM

## 2020-08-28 ENCOUNTER — Other Ambulatory Visit: Payer: Self-pay | Admitting: Physician Assistant

## 2020-08-28 DIAGNOSIS — Z79890 Hormone replacement therapy (postmenopausal): Secondary | ICD-10-CM

## 2020-08-28 DIAGNOSIS — N951 Menopausal and female climacteric states: Secondary | ICD-10-CM

## 2020-08-28 NOTE — Telephone Encounter (Signed)
Requested Prescriptions  Pending Prescriptions Disp Refills  . progesterone (PROMETRIUM) 100 MG capsule [Pharmacy Med Name: PROGESTERONE 100 MG CAPSULE] 90 capsule 0    Sig: TAKE 1 CAPSULE BY MOUTH EVERY DAY     OB/GYN:  Progestins Passed - 08/28/2020  1:25 AM      Passed - Valid encounter within last 12 months    Recent Outpatient Visits          7 months ago Cervical cancer screening   [place], [place], PA-C   8 months ago Annual physical exam   [facility] Carles Collet M, [place]   1 year ago Need for shingles vaccine   [facility] [place], Wendee Beavers, [place]   1 year ago Need for hepatitis B vaccination   [facility] [place], Wendee Beavers, [place]   1 year ago Hospital discharge follow-up   [place], [place], [place]             . estradiol (ESTRACE) 0.5 MG tablet [Pharmacy Med Name: ESTRADIOL 0.5 MG TABLET] 90 tablet 0    Sig: TAKE 1 TABLET BY MOUTH EVERY DAY     OB/GYN:  Estrogens Passed - 08/28/2020  1:25 AM      Passed - Mammogram is up-to-date per Health Maintenance      Passed - Last BP in normal range    BP Readings from Last 1 Encounters:  01/12/20 120/82         Passed - Valid encounter within last 12 months    Recent Outpatient Visits          7 months ago Cervical cancer screening   [facility] Carles Collet M, [place]   8 months ago Annual physical exam   [facility] [place], Wendee Beavers, [place]   1 year ago Need for shingles vaccine   [facility] [place], Wendee Beavers, [place]   1 year ago Need for hepatitis B vaccination   [facility] [place], Wendee Beavers, [place]   1 year ago [facility] discharge follow-up   [place], Wendee Beavers, [place]

## 2020-09-10 IMAGING — CT CT ABDOMEN AND PELVIS WITH CONTRAST
2 of 5 series · 15 of 46 positions shown, 17 images · IV contrast (APPLIED)
Comparison: 10/13/2013

CLINICAL DATA: States here for fever since [REDACTED]. States it was
99. Denies taking any tylenol or motrin. Denies cough, congestion.
No distress noted. States hx of UC, denies abd pain today. Started
on a new med for UC a week ago. Denies N&V&D. States "I'm just
worried."

EXAM:
CT ABDOMEN AND PELVIS WITH CONTRAST
TECHNIQUE: Multidetector CT imaging of the abdomen and pelvis was performed
using the standard protocol following bolus administration of
intravenous contrast.
CONTRAST:  75mL OMNIPAQUE IOHEXOL 300 MG/ML  SOLN

[Series 2: routine abd/pel with · axial · 0.67mm/px · z∈[-458,-83]mm · 12 of 85 slices shown, 14 images]
[im 5/85  soft-tissue]
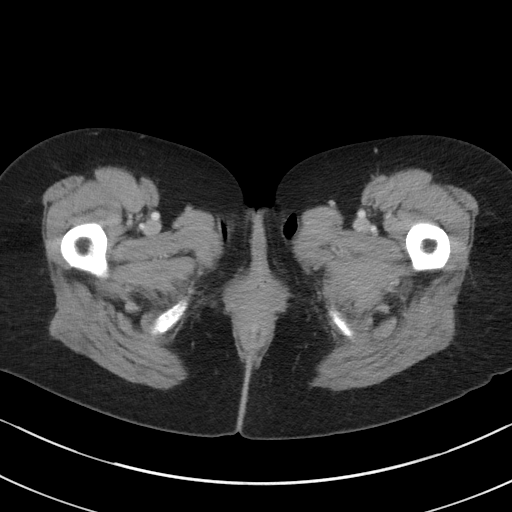
[im 5/85  bone]
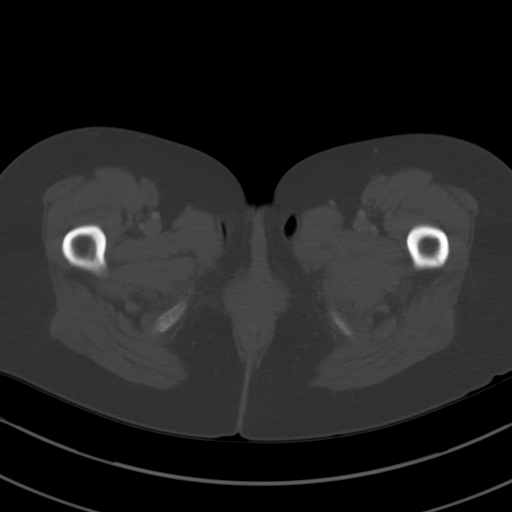
[im 15/85  soft-tissue]
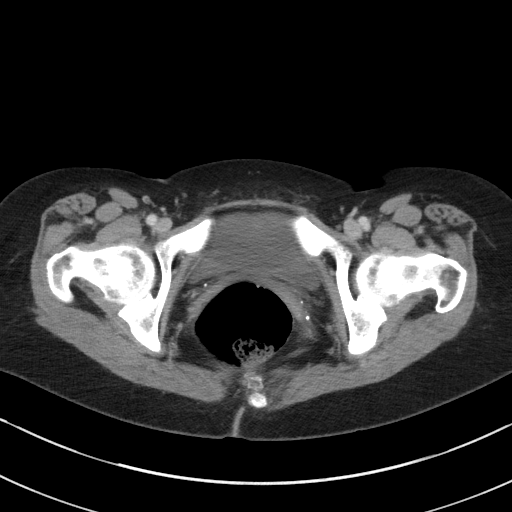
[im 20/85  soft-tissue]
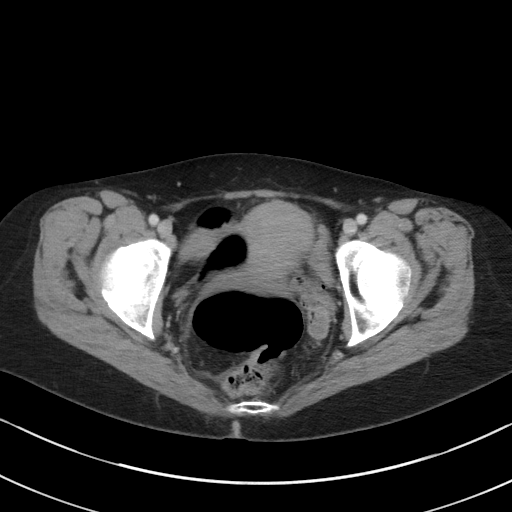
[im 25/85  soft-tissue]
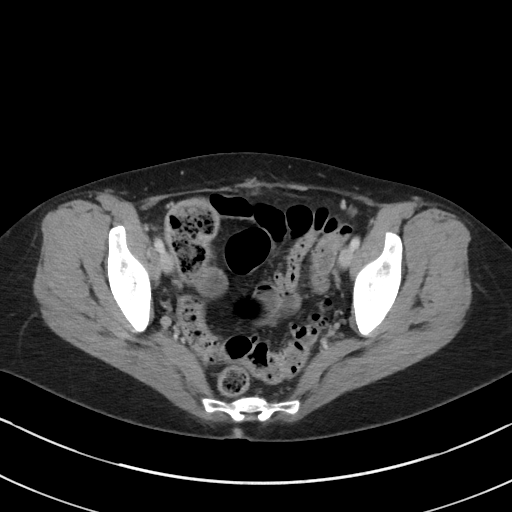
[im 35/85  soft-tissue]
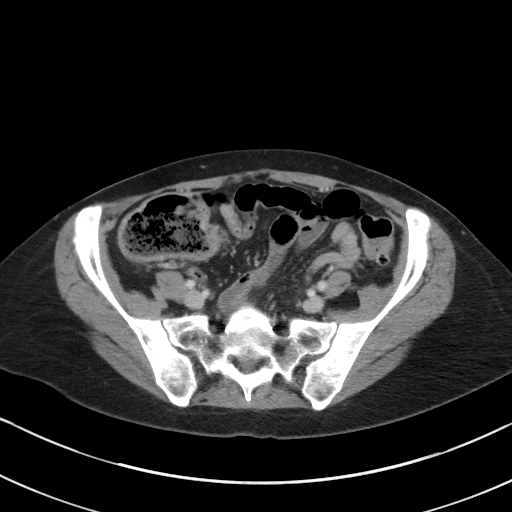
[im 40/85  soft-tissue]
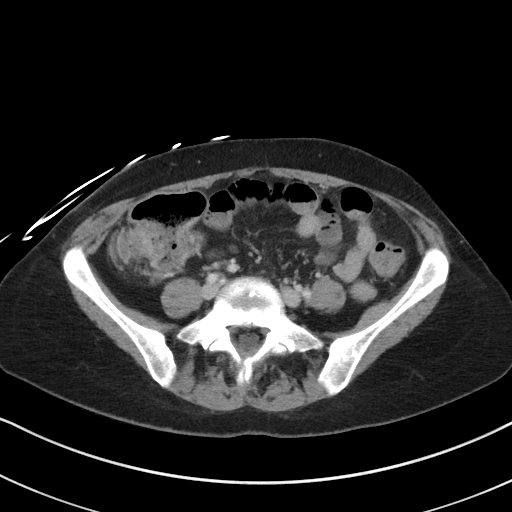
[im 45/85  soft-tissue]
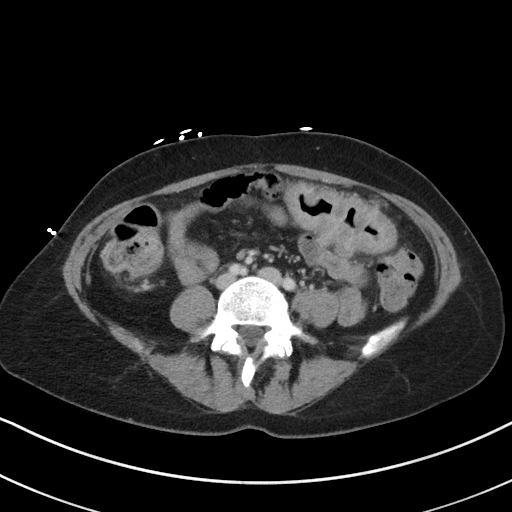
[im 55/85  soft-tissue]
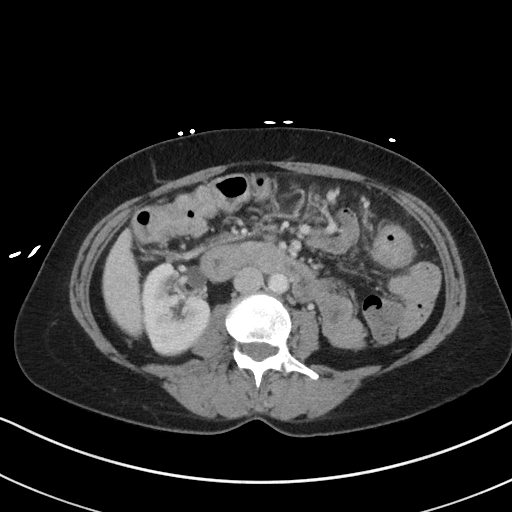
[im 60/85  soft-tissue]
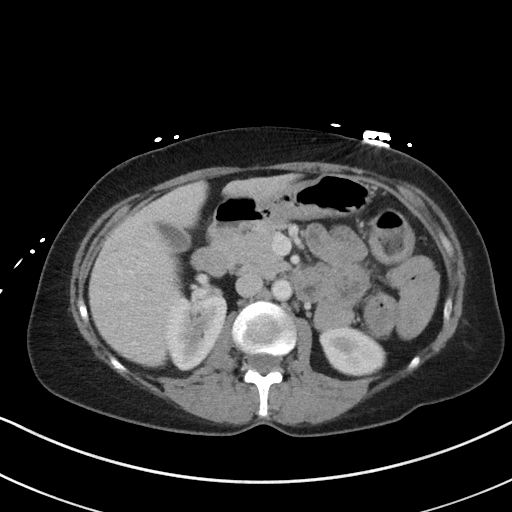
[im 60/85  bone]
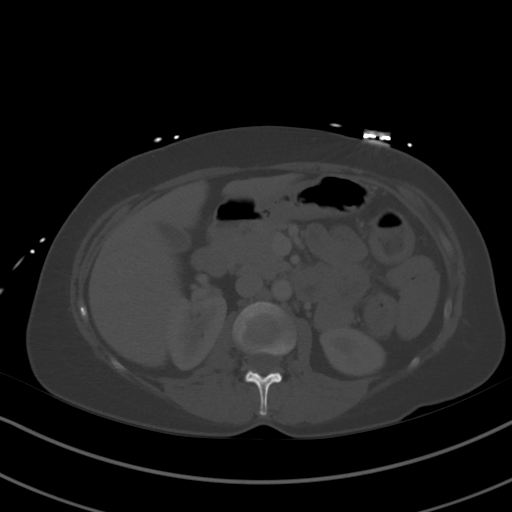
[im 65/85  soft-tissue]
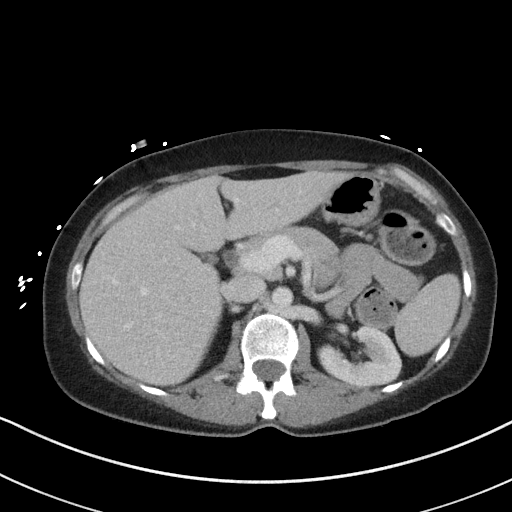
[im 75/85  soft-tissue]
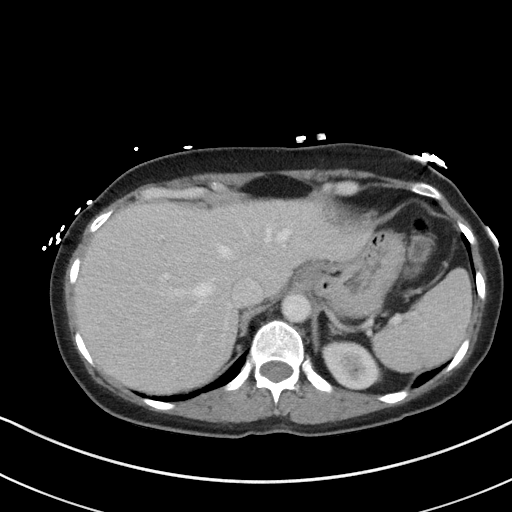
[im 80/85  soft-tissue]
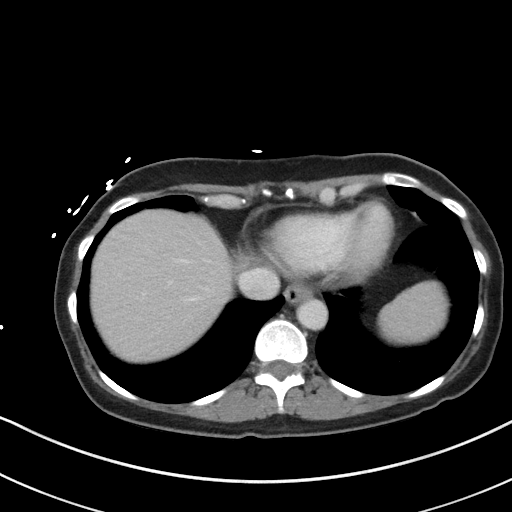

[Series 5: coronal st · coronal · 0.64mm/px · 3 of 70 slices shown]
[im 24/70  soft-tissue]
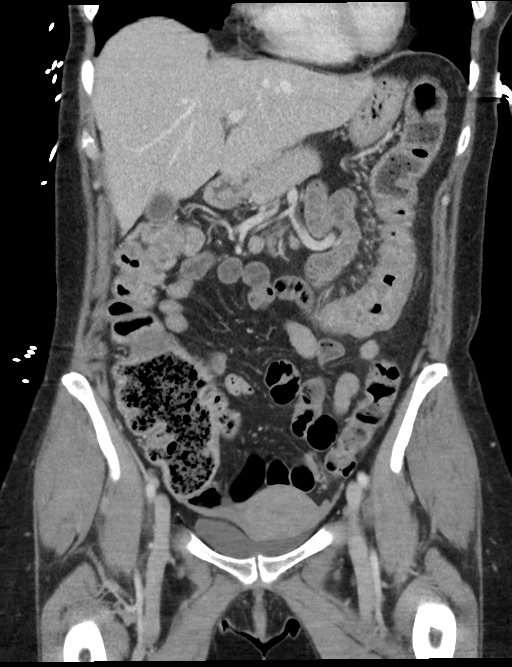
[im 31/70  soft-tissue]
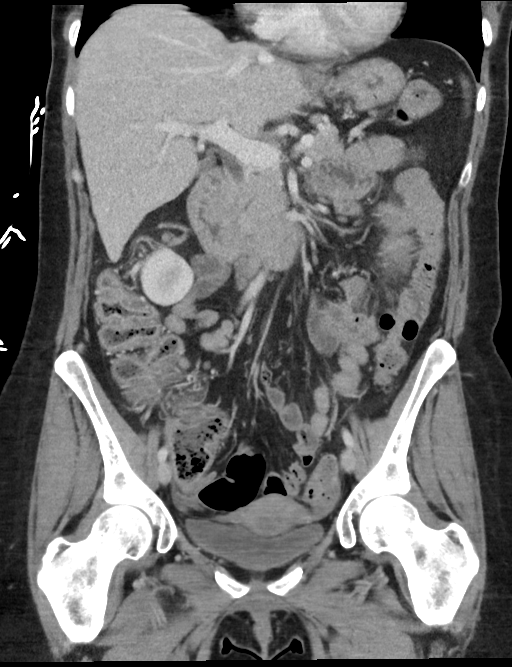
[im 39/70  soft-tissue]
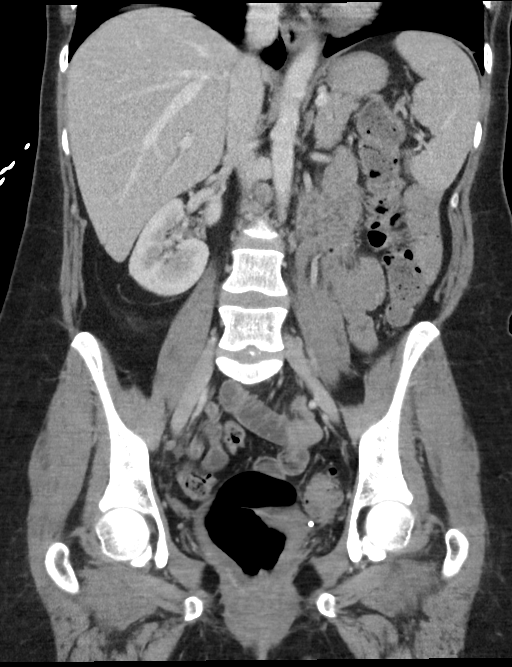

[15 of 46 positions shown; findings below may reference images not displayed]

FINDINGS: Lower chest: No acute abnormality.

Hepatobiliary: No focal liver abnormality is seen. No gallstones,
gallbladder wall thickening, or biliary dilatation.

Pancreas: Unremarkable. No pancreatic ductal dilatation or
surrounding inflammatory changes.

Spleen: Normal in size without focal abnormality.

Adrenals/Urinary Tract: Adrenal glands are unremarkable. Kidneys are
normal, without renal calculi, focal lesion, or hydronephrosis.
Bladder is unremarkable.

Stomach/Bowel: There is irregular wall thickening of the left
transverse colon with mild associated inflammatory change in the
adjacent fat. No other colonic wall thickening. There are scattered
diverticula mostly along the sigmoid colon. No diverticulitis.

Appendix is prominent measuring 6-7 mm in diameter, but with no
associated inflammation. No convincing appendicitis.

Small bowel is normal caliber no wall thickening or inflammatory
change. Normal stomach.

Vascular/Lymphatic: There are prominent mesenteric lymph nodes,
largest measuring 10.

No vascular abnormality.  Mm in short axis

Reproductive: Uterus and bilateral adnexa are unremarkable.

Other: No abdominal wall hernia or abnormality. No abdominopelvic
ascites.

Musculoskeletal: No fracture or acute finding. No osteoblastic or
osteolytic lesions.
IMPRESSION: 1. Irregular thickening of the wall of the left transverse colon
with mild associated inflammation, consistent with active
inflammatory bowel disease. No abscess or extraluminal/free air. No
evidence of bowel obstruction. Associated mild mesenteric
adenopathy.
2. No other acute abnormality within the abdomen or pelvis.

## 2020-09-10 IMAGING — CR CHEST - 2 VIEW
1 series · 2 of 2 positions shown · non-contrast
Comparison: March 07, 2015

CLINICAL DATA: Fever

EXAM:
CHEST - 2 VIEW

[Series 1: dg chest 2 view · 0.14mm/px · 2 of 2 slices shown]
[im 1/2]
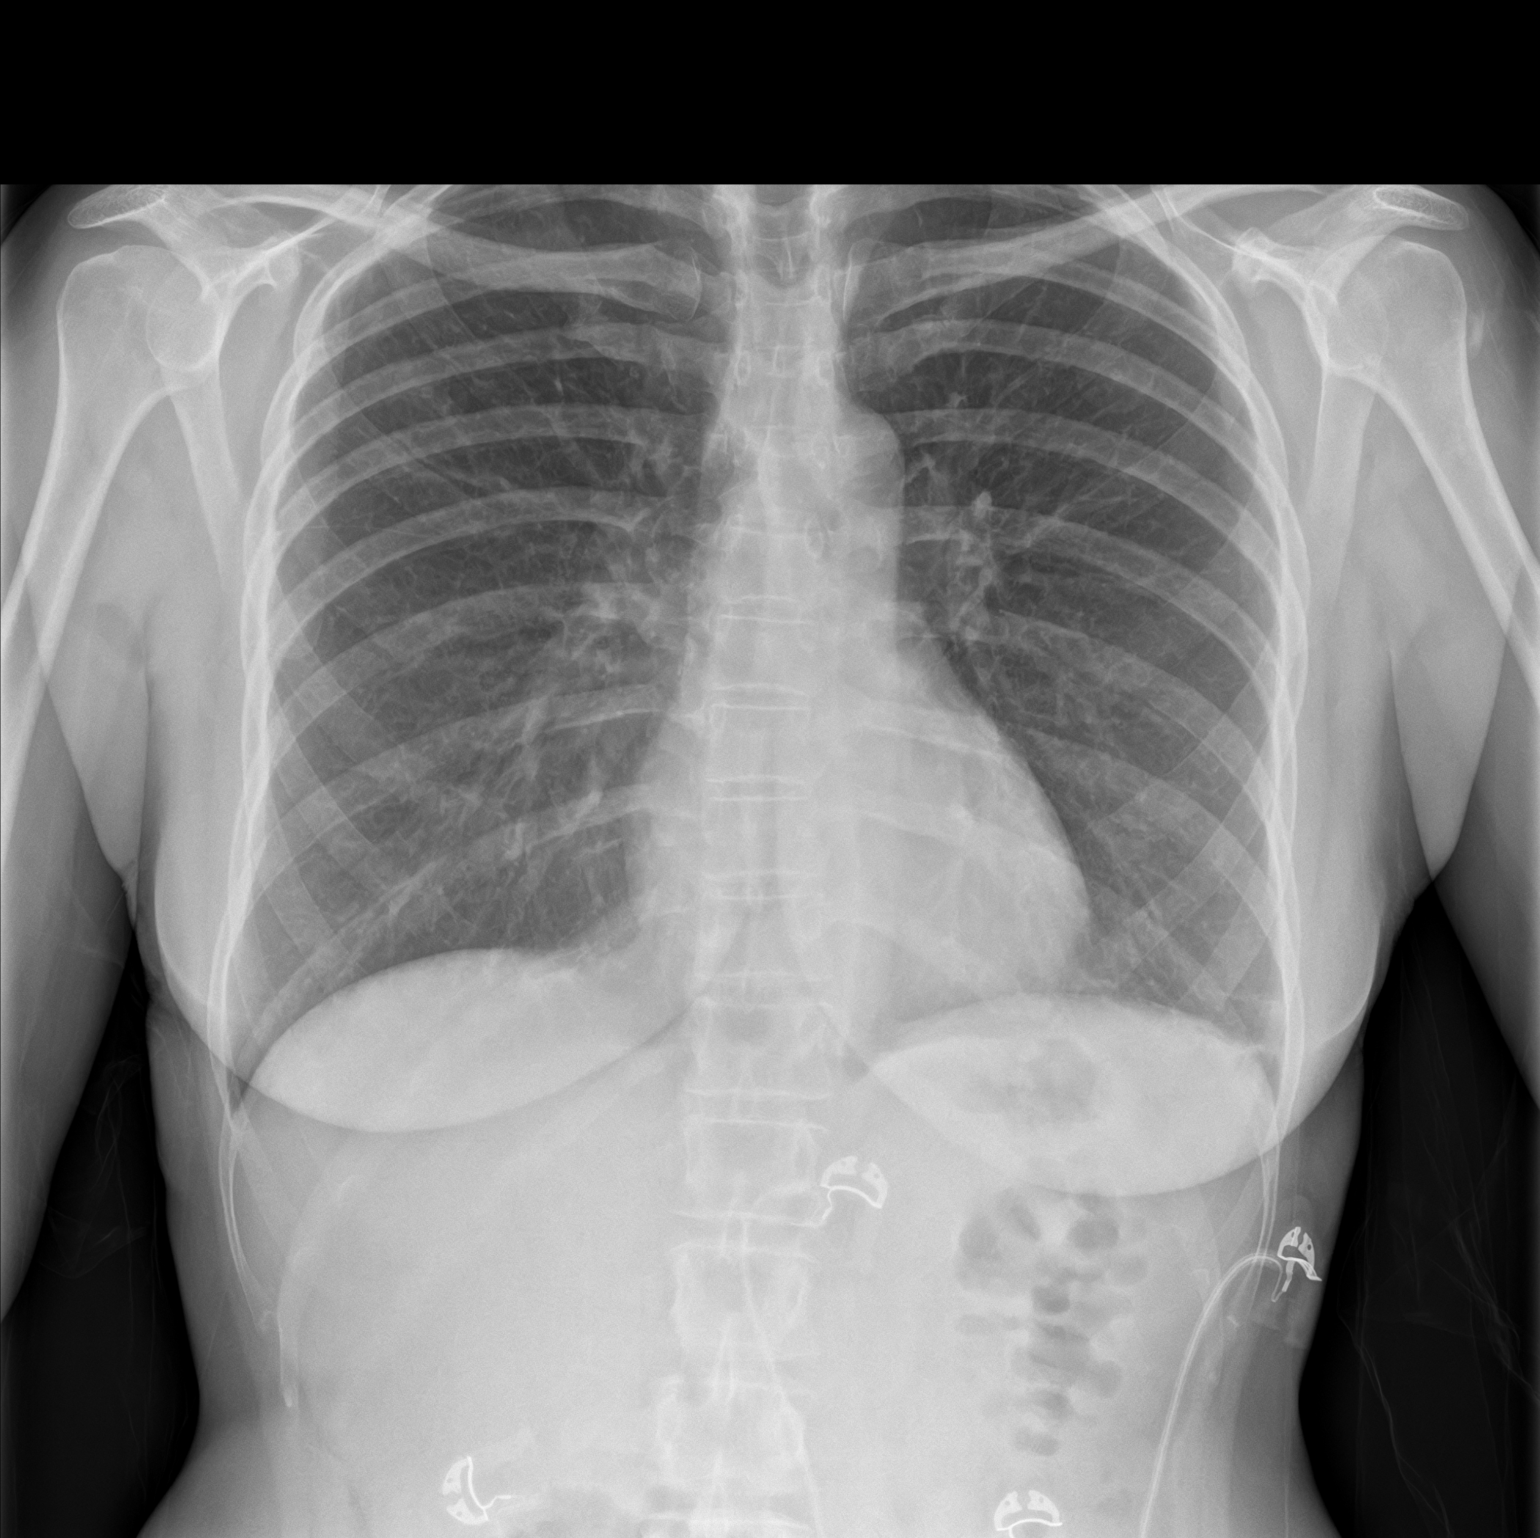
[im 2/2]
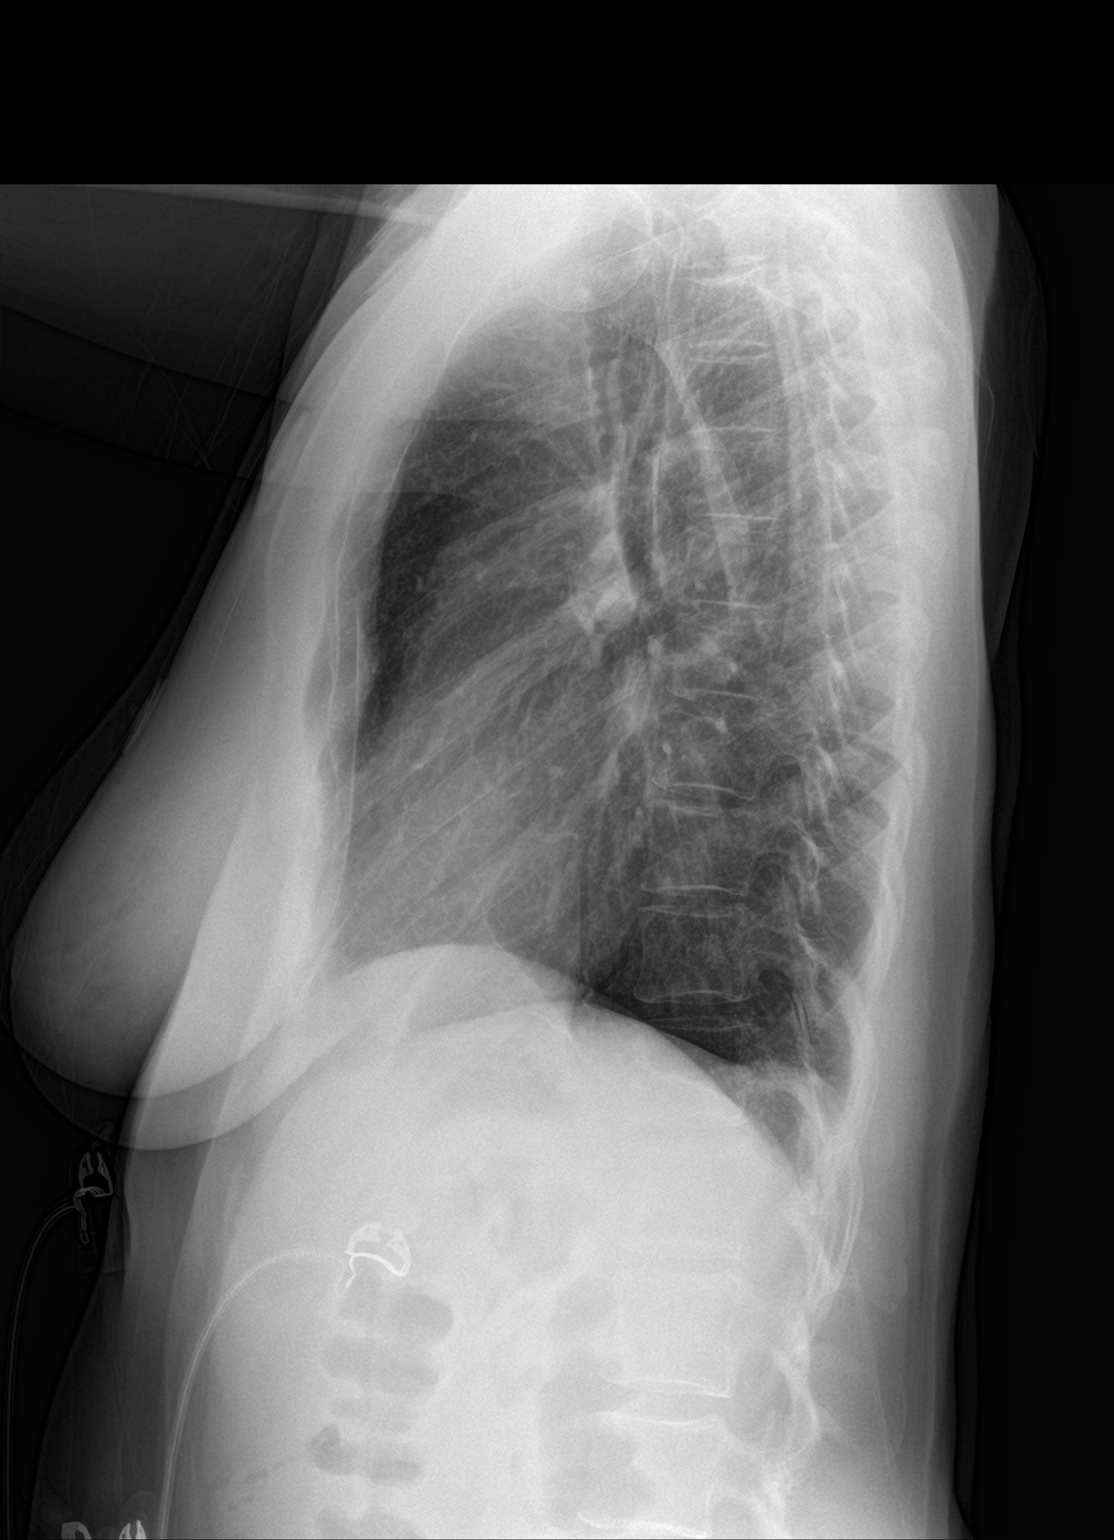

[2 of 2 positions shown; findings below may reference images not displayed]

FINDINGS: There is slight lateral left base atelectasis. Lungs elsewhere are
clear. Heart size and pulmonary vascularity are normal. No
adenopathy. No bone lesions.
IMPRESSION: Slight lateral left base atelectasis. Lungs elsewhere clear. No
evident adenopathy.

## 2020-09-30 ENCOUNTER — Other Ambulatory Visit: Payer: Self-pay | Admitting: Physician Assistant

## 2020-09-30 DIAGNOSIS — F419 Anxiety disorder, unspecified: Secondary | ICD-10-CM

## 2020-09-30 NOTE — Telephone Encounter (Signed)
Requested medications are due for refill today yes  Requested medications are on the active medication list yes  Last refill 11/4  Last visit 02/2020  Future visit scheduled no  Notes to clinic Failed protocol due to no valid visit within 6  months.

## 2020-10-03 NOTE — Telephone Encounter (Signed)
L.O.V. was on 01/12/2020 and no upcoming visit.

## 2020-10-04 ENCOUNTER — Encounter: Payer: Self-pay | Admitting: Gastroenterology

## 2020-10-09 ENCOUNTER — Telehealth: Payer: Self-pay

## 2020-10-09 NOTE — Telephone Encounter (Signed)
Got a home health certification and plan a care form from 10/15/2020 to 01/13/21. Patient needs a appointment so we are able to fill out the form. Sent patient a Pharmacist, community message informing patient she needed a appointment

## 2020-11-21 ENCOUNTER — Other Ambulatory Visit: Payer: Self-pay | Admitting: Physician Assistant

## 2020-11-21 DIAGNOSIS — N951 Menopausal and female climacteric states: Secondary | ICD-10-CM

## 2020-11-21 DIAGNOSIS — Z79890 Hormone replacement therapy (postmenopausal): Secondary | ICD-10-CM

## 2020-11-21 MED ORDER — ESTRADIOL 0.5 MG PO TABS: 0.5000 mg | ORAL_TABLET | Freq: Every day | ORAL | 0 refills | Status: DC

## 2020-11-21 MED ORDER — PROGESTERONE MICRONIZED 100 MG PO CAPS: 100.0000 mg | ORAL_CAPSULE | Freq: Every day | ORAL | 0 refills | Status: DC

## 2020-11-21 NOTE — Telephone Encounter (Signed)
Have filled x 2 months. Patient will need CPE and f/u to continue receiving refills.

## 2020-11-22 ENCOUNTER — Ambulatory Visit (INDEPENDENT_AMBULATORY_CARE_PROVIDER_SITE_OTHER): Payer: Managed Care, Other (non HMO) | Admitting: Dermatology

## 2020-11-22 ENCOUNTER — Other Ambulatory Visit: Payer: Self-pay

## 2020-11-22 ENCOUNTER — Encounter: Payer: Self-pay | Admitting: Dermatology

## 2020-11-22 DIAGNOSIS — D179 Benign lipomatous neoplasm, unspecified: Secondary | ICD-10-CM | POA: Diagnosis not present

## 2020-11-22 DIAGNOSIS — L821 Other seborrheic keratosis: Secondary | ICD-10-CM

## 2020-11-22 DIAGNOSIS — L820 Inflamed seborrheic keratosis: Secondary | ICD-10-CM

## 2020-11-22 NOTE — Patient Instructions (Addendum)
Cryotherapy Aftercare  . Wash gently with soap and water everyday.   Marland Kitchen Apply Vaseline and Band-Aid daily until healed.     Pre-Operative Instructions  You are scheduled for a surgical procedure at [facility]. We recommend you read the following instructions. If you have any questions or concerns, please call the office at [phone].  1. Shower and wash the entire body with soap and water the day of your surgery paying special attention to cleansing at and around the planned surgery site.  2. Avoid aspirin or aspirin containing products at least fourteen (14) days prior to your surgical procedure and for at least one week (7 Days) after your surgical procedure. If you take aspirin on a regular basis for heart disease or history of stroke or for any other reason, we may recommend you continue taking aspirin but please notify us if you take this on a regular basis. Aspirin can cause more bleeding to occur during surgery as well as prolonged bleeding and bruising after surgery.   3. Avoid other nonsteroidal pain medications at least one week prior to surgery and at least one week prior to your surgery. These include medications such as Ibuprofen (Motrin, Advil and Nuprin), Naprosyn, Voltaren, Relafen, etc. If medications are used for therapeutic reasons, please inform us as they can cause increased bleeding or prolonged bleeding during and bruising after surgical procedures.   4. Please advise [place] if you are taking any "blood thinner" medications such as Coumadin or Dipyridamole or Plavix or similar medications. These cause increased bleeding and prolonged bleeding during procedures and bruising after surgical procedures. We may have to consider discontinuing these medications briefly prior to and shortly after your surgery if safe to do so.   5. Please inform us of all medications you are currently taking. All medications that are taken regularly should be taken the day of surgery as you  always do. Nevertheless, we need to be informed of what medications you are taking prior to surgery to know whether they will affect the procedure or cause any complications.   6. Please inform us of any medication allergies. Also inform us of whether you have allergies to Latex or rubber products or whether you have had any adverse reaction to Lidocaine or Epinephrine.  7. Please inform us of any prosthetic or artificial body parts such as artificial heart valve, joint replacements, etc., or similar condition that might require preoperative antibiotics.   8. We recommend avoidance of alcohol at least two weeks prior to surgery and continued avoidance for at least two weeks after surgery.   9. We recommend discontinuation of tobacco smoking at least two weeks prior to surgery and continued abstinence for at least two weeks after surgery.  10. Do not plan strenuous exercise, strenuous work or strenuous lifting for approximately four weeks after your surgery.   11. We request if you are unable to make your scheduled surgical appointment, please call us at least a week in advance or as soon as you are aware of a problem so that we can cancel or reschedule the appointment.   12. You MAY TAKE TYLENOL (acetaminophen) for pain as it is not a blood thinner.   13. PLEASE PLAN TO BE IN TOWN FOR TWO WEEKS FOLLOWING SURGERY, THIS IS IMPORTANT SO YOU CAN BE CHECKED FOR DRESSING CHANGES, SUTURE REMOVAL AND TO MONITOR FOR POSSIBLE COMPLICATIONS.

## 2020-11-22 NOTE — Progress Notes (Signed)
   Follow-Up Visit   Subjective  Natasha Mueller is a 55 y.o. female who presents for the following: spots (Face, arms, hands, chest, back. She picks at areas and they bleed. She would like them treated today. She also has a lipoma vs cyst of the left anterior shoulder she would like checked again today.).  She feels like it has grown.   The following portions of the chart were reviewed this encounter and updated as appropriate:       Review of Systems:  No other skin or systemic complaints except as noted in HPI or Assessment and Plan.  Objective  Well appearing patient in no apparent distress; mood and affect are within normal limits.  A focused examination was performed including face, trunk, arms. Relevant physical exam findings are noted in the Assessment and Plan.  Objective  Back x 6, L chest x 1, R jaw x 1, L forehead x 2, frontal scalp x 2, L sideburn x 1, R forehead x 4, R hand x 3, R forearm x 1, L hand x 1, L forearm x 1 (23): Erythematous keratotic or waxy stuck-on papule   Objective  Left Anterior Shoulder: 3.0 x 2.0cm rubbery nodule.   Assessment & Plan  Inflamed seborrheic keratosis (23) Back x 6, L chest x 1, R jaw x 1, L forehead x 2, frontal scalp x 2, L sideburn x 1, R forehead x 4, R hand x 3, R forearm x 1, L hand x 1, L forearm x 1  Prior to procedure, discussed risks of blister formation, small wound, skin dyspigmentation, or rare scar following cryotherapy.    Destruction of lesion - Back x 6, L chest x 1, R jaw x 1, L forehead x 2, frontal scalp x 2, L sideburn x 1, R forehead x 4, R hand x 3, R forearm x 1, L hand x 1, L forearm x 1  Destruction method: cryotherapy   Informed consent: discussed and consent obtained   Lesion destroyed using liquid nitrogen: Yes   Region frozen until ice ball extended beyond lesion: Yes   Outcome: patient tolerated procedure well with no complications   Post-procedure details: wound care instructions given    Lipoma,  unspecified site Left Anterior Shoulder  Growing per pt Discussed surgical option with patient, including resulting scar and possible recurrence.  Pre op form given to patient.    Seborrheic Keratoses - Stuck-on, waxy, tan-brown papules and plaques  - Discussed benign etiology and prognosis. - Observe - Call for any changes   Return for excision for lipoma of the left anterior shoulder.   IJamesetta Orleans, CMA, am acting as scribe for Brendolyn Patty, MD .  Documentation: I have reviewed the above documentation for accuracy and completeness, and I agree with the above.  Brendolyn Patty MD

## 2020-11-24 NOTE — Telephone Encounter (Signed)
Called patient and left a detailed vm to call and schedule a physical and follow up to receive future refill per Fabio Bering.

## 2020-11-29 ENCOUNTER — Ambulatory Visit (INDEPENDENT_AMBULATORY_CARE_PROVIDER_SITE_OTHER): Payer: Managed Care, Other (non HMO) | Admitting: Gastroenterology

## 2020-11-29 ENCOUNTER — Other Ambulatory Visit: Payer: Self-pay

## 2020-11-29 ENCOUNTER — Encounter: Payer: Self-pay | Admitting: Gastroenterology

## 2020-11-29 VITALS — BP 128/80 | HR 71 | Temp 98.1°F | Ht 64.0 in | Wt 133.1 lb

## 2020-11-29 DIAGNOSIS — K5100 Ulcerative (chronic) pancolitis without complications: Secondary | ICD-10-CM

## 2020-11-29 MED ORDER — VEDOLIZUMAB 300 MG IV SOLR: 300.0000 mg | INTRAVENOUS | 6 refills | Status: DC

## 2020-11-29 NOTE — Progress Notes (Signed)
Cephas Darby, MD [address]  [place]  [place], [place] [address]  Main: [phone]  Fax: [phone]    Gastroenterology Consultation  Referring Provider:     Paulene Floor Primary Care Physician:  Paulene Floor Primary Gastroenterologist:  Dr. Cephas Darby Reason for Consultation:     Ulcerative pancolitis        HPI:   Natasha Mueller is a 55 y.o. female referred by Dr. Trinna Post, PA-C  for consultation & management of UC  Ulcerative colitis diagnosed in 2006, previously on 5-ASA compounds without any response, switched to 6-MP which patient has been taking intermittently.  Patient was recently admitted to [facility] on 01/11/2019 due to abdominal pain, fever and CT revealed inflammation in her colon.  She was empirically started on antibiotics after stool studies came back negative for infection including C. difficile.  She was also found to have elevated CRP 13.7, ESR 60 as well as new onset of normocytic anemia.  Because she reported that she was steroid resistant in the past, she was not started on steroids on admission.  Due to persistent symptoms of abdominal pain, diarrhea started her on IV Solu-Medrol.  Subsequently, she underwent colonoscopy on 01/15/2019 which revealed mild to moderate continuous inflammation, severe in the right colon compared to left colon, advanced to transverse colon only due to severity of inflammation.  She was improving after discontinuation of antibiotics, she was transitioned to p.o. prednisone and was discharged home.  Follow-up visit 05/06/2019 Patient started [facility], received a second induction dose so far.  She reports doing well overall other than mild abdominal discomfort, 3-4 loose bowel movements daily.  She has been gaining weight.  She denies nausea, vomiting, rectal bleeding, fatigue.  She is receiving home infusion of Entyvio and tolerating it very well.  She stopped mercaptopurine and not on  prednisone  Follow-up visit 08/05/2019 Patient continues to take Entyvio.  Received first maintenance dose about a week ago.  She is tolerating infusions well.  She noticed that by end of week 6, she had increased bowel frequency, up to 4 bowel movements a day associated with mild abdominal discomfort.  She denied any blood in the stool.  She also noticed large joint arthralgias as well as severe bilateral hip pain.  After the infusion, her GI symptoms resolved.  Currently, she is not experiencing any arthralgias.  She was wondering if the medication was wearing off by week 6 and if she needs it at a closer interval.  She does report fatigue and started taking vitamin B12 daily.  She also notices that she takes some time for clearing of her vision in the morning.  She does have glasses but has not seen an ophthalmologist.  She denies double vision, pain in her eyes, redness of the eyes.  Follow-up visit 11/29/20 Ms. Yarlin has not seen me for more than a year.  She has history of panulcerative colitis, which is quiesced sent and currently maintained on Entyvio every 6 weeks.  She does not have any concerns today.  She is receiving home infusions.  She wants to know if she can get extra IV fluids after each infusion.  She does not have any other concerns otherwise.  She denies any GI symptoms.  She reports having regular bowel movements.  She is currently working from home.  She is getting maintenance labs every other infusion and have been unremarkable.  She prefers not to receive COVID-19  vaccine   IBD diagnosis: Ulcerative colitis diagnosed in 2006  Disease course: -- 12/11/04: CC - abdominal pain & 10-15 episodes of diarrhea with frank blood per day x 3 months. -- 12/13/04: CSY - congested, erythematous, friable, granular, hemorrhagic, and ulcerated mucosa in rectum, sigmoid, splenic flexure, transverse, hepatic flexure, and ascending colon (path: active colitis with crypt abscess formation c/w IBD).  She  was initially treated with Asacol and Lialda with no clinical response.  Later switched to 6-MP, initially 100 mg, gradually decreased to 50 mg as she said she was not tolerating it well.   She reports having intermittent symptoms for which she has been taking prednisone on and off as well as 6-MP as needed --12/2018: Admitted to [facility] due to flareup of ulcerative colitis, colonoscopy confirmed moderate to severe pancolitis. Entyvio started in 03/2019.  Off prednisone and 6-MP   Extra intestinal manifestations: None  IBD surgical history: None  Imaging:  MRE none CTE none CT abdomen and pelvis with contrast on 01/10/2019 1. Irregular thickening of the wall of the left transverse colon with mild associated inflammation, consistent with active inflammatory bowel disease. No abscess or extraluminal/free air. No evidence of bowel obstruction. Associated mild mesenteric adenopathy. 2. No other acute abnormality within the abdomen or pelvis. SBFT none  Procedures:  Colonoscopy -- 12/13/04: CSY - congested, erythematous, friable, granular, hemorrhagic, and ulcerated mucosa in rectum, sigmoid, splenic flexure, transverse, hepatic flexure, and ascending colon (path: active colitis with crypt abscess formation c/w IBD). -- 03/07/05: CSY - normal TI and cecum, 6-8 mm hyperplastic in transverse colon. -- 01/17/10: CSY - sigmoid/descending/ascending diverticulosis, pseudopolyps in splenic flexure, internal hemorrhoids (path: colonic mucosa w/o pathologic abnormalities). 11/10/13: CSY - normal TI, 3 diminutive polyps at hepatic flexure (path: focal glandular hyperplasia and lymphoid aggregates), sigmoid diverticulosis, small internal hemorrhoids (random bx path: colonic mucosa focal glandular hyperplasia and lymphoid aggregates);  01/15/2019 - Diffuse moderate inflammation was found in the transverse colon secondary to colitis. Biopsied. - Diffuse mild inflammation was found in the descending colon  secondary to colitis. - The sigmoid colon is normal. Biopsied. - Diffuse mild inflammation was found in the rectum secondary to colitis. Biopsied. - Diverticulosis in the sigmoid colon, in the descending colon and in the transverse colon. DIAGNOSIS:  A. COLON, TRANSVERSE; COLD BIOPSY:  - CHRONIC COLITIS WITH SEVERE ACTIVITY (CRYPT DESTRUCTION, CRYPT  ABSCESSES, AND CRYPTITIS).  - NEGATIVE FOR GRANULOMA, DYSPLASIA, AND MALIGNANCY.   B. COLON, DESCENDING; COLD BIOPSY:  - CHRONIC COLITIS WITH MODERATE ACTIVITY (CRYPTITIS AND CRYPT  ABSCESSES).  - NEGATIVE FOR GRANULOMA, DYSPLASIA, AND MALIGNANCY.   C. COLON, SIGMOID; COLD BIOPSY:  - CHRONIC COLITIS WITHOUT ACTIVITY.  - NEGATIVE FOR GRANULOMA, DYSPLASIA, AND MALIGNANCY.   D. RECTUM; COLD BIOPSY:  - CHRONIC PROCTITIS WITH OUT ACTIVITY.  - NEGATIVE FOR GRANULOMA, DYSPLASIA, AND MALIGNANCY.   Upper Endoscopy 11/10/13: EGD - normal esophagus, single fundic gland polyp, normal duodenum.  - The examined portion of the ileum was normal. - Normal mucosa in the entire examined colon. Chromoscopy performed. Biopsied. - Two small polyps in the ascending colon, removed with a cold snare. Resected and retrieved. - Diverticulosis in the sigmoid colon and in the descending colon. - Pseudopolyps in the descending colon. - The distal rectum and anal verge are normal on retroflexion view. - Inactive (Mayo Score 0) ulcerative colitis, in remission, improved since the last examination.  DIAGNOSIS:  A. COLON, RIGHT, RANDOM; BIOPSIES:  - NEGATIVE FOR ACTIVE MUCOSAL COLITIS, DYSPLASIA AND MALIGNANCY.  B. COLON POLYPS X2, ASCENDING; BIOPSY:  - MINUTE TUBULAR ADENOMA AND A FRAGMENT OF POLYPOID BENIGN COLONIC  MUCOSA.  - NEGATIVE FOR HIGH-GRADE DYSPLASIA AND MALIGNANCY.   C. COLON, LEFT, RANDOM; BIOPSY:  - NEGATIVE FOR ACTIVE MUCOSAL COLITIS, DYSPLASIA AND MALIGNANCY.  - FEW REACTIVE LYMPHOID AGGREGATES.  - FEW FRAGMENTS WITH MUCOSAL HEMORRHAGE.    Past Medical History:  Diagnosis Date  . Allergy   . BRCA negative 2013   with BART  . Family history of breast cancer    BRCA/Bart neg 2013; IBIS=15%  . Squamous cell carcinoma of skin 10/31/2009   Left superior clavicle/medial shoulder  . UC (ulcerative colitis) [facility]     Past Surgical History:  Procedure Laterality Date  . CESAREAN SECTION    . COLONOSCOPY WITH PROPOFOL N/A 01/15/2019   Procedure: COLONOSCOPY WITH PROPOFOL;  Surgeon: Lucilla Lame, MD;  Location: [facility] ENDOSCOPY;  Service: Endoscopy;  Laterality: N/A;  . COLONOSCOPY WITH PROPOFOL N/A 08/27/2019   Procedure: COLONOSCOPY WITH PROPOFOL;  Surgeon: Lin Landsman, MD;  Location: [facility] ENDOSCOPY;  Service: Gastroenterology;  Laterality: N/A;    Current Outpatient Medications:  .  estradiol (ESTRACE) 0.5 MG tablet, Take 1 tablet (0.5 mg total) by mouth daily., Disp: 60 tablet, Rfl: 0 .  progesterone (PROMETRIUM) 100 MG capsule, Take 1 capsule (100 mg total) by mouth daily., Disp: 60 capsule, Rfl: 0 .  sertraline (ZOLOFT) 50 MG tablet, TAKE 1 TABLET (50 MG TOTAL) BY MOUTH DAILY FOR 14 DAYS, THEN 1.5 TABLETS (75 MG TOTAL) DAILY., Disp: 45 tablet, Rfl: 3 .  vedolizumab (ENTYVIO) 300 MG injection, Inject 300 mg into the vein every 8 (eight) weeks., Disp: 1 each, Rfl: 6    Family History  Problem Relation Age of Onset  . Breast cancer Mother 58  . Hypertension Mother   . Atrial fibrillation Father   . Bladder Cancer Father 10  . Diabetes Sister   . Breast cancer Sister   . Atrial fibrillation Brother      Social History   Tobacco Use  . Smoking status: Never Smoker  . Smokeless tobacco: Never Used  Vaping Use  . Vaping Use: Never used  Substance Use Topics  . Alcohol use: No  . Drug use: No    Allergies as of 11/29/2020 - Review Complete 11/29/2020  Allergen Reaction Noted  . Hyoscyamine sulfate Swelling 03/13/2015    Review of Systems:    All systems reviewed and negative except where noted in  HPI.   Physical Exam:  BP 128/80 (BP Location: Left Arm, Patient Position: Sitting, Cuff Size: Normal)   Pulse 71   Temp 98.1 F (36.7 C) (Oral)   Ht 5' 4" (1.626 m)   Wt 133 lb 2 oz (60.4 kg)   LMP 06/14/2016 (Exact Date)   BMI 22.85 kg/m  Patient's last menstrual period was 06/14/2016 (exact date).  General:   Alert,  Well-developed, well-nourished, pleasant and cooperative in NAD Head:  Normocephalic and atraumatic. Eyes:  Sclera clear, no icterus.   Conjunctiva pink. Ears:  Normal auditory acuity. Nose:  No deformity, discharge, or lesions. Mouth:  No deformity or lesions,oropharynx pink & moist. Neck:  Supple; no masses or thyromegaly. Lungs:  Respirations even and unlabored.  Clear throughout to auscultation.   No wheezes, crackles, or rhonchi. No acute distress. Heart:  Regular rate and rhythm; no murmurs, clicks, rubs, or gallops. Abdomen:  Normal bowel sounds. Soft, non-tender and non-distended without masses, hepatosplenomegaly or hernias noted.  No guarding or  rebound tenderness.   Rectal: Not performed Msk:  Symmetrical without gross deformities. Good, equal movement & strength bilaterally. Pulses:  Normal pulses noted. Extremities:  No clubbing or edema.  No cyanosis. Neurologic:  Alert and oriented x3;  grossly normal neurologically. Skin:  Intact without significant lesions or rashes. No jaundice. Psych:  Alert and cooperative. Normal mood and affect.  Imaging Studies: Reviewed  Assessment and Plan:   KEYMIAH LYLES is a 55 y.o. female with history of ulcerative pancolitis since 2006, moderately severe, previously maintained on 6-MP, colonoscopy in 12/2018 confirmed exacerbation of ulcerative colitis responsive to prednisone, started Entyvio monotherapy in 03/2019, currently in clinical remission.  Patient does report mild breakthrough symptoms of abdominal pain and loose stools at week 6 before receiving her first maintenance dose at week 8.  Currently,  asymptomatic  Ulcerative pancolitis: In histologic remission based on the colonoscopy in 07/2019 continue Entyvio monotherapy, increase interval to every 8 weeks from 6 weeks  Patient will inform me if she has any breakthrough symptoms  IBD Health Maintenance  1.TB status: QuantiFERON gold negative on 01/15/2019 2. Anemia: No evidence of anemia, normal iron and B12 stores 3.Immunizations: Hep A and B  received second dose, Influenza recommend annually, prevnar administered in 03/2019, pneumovax administered in 05/2019, Varicella unknown, Shingrix vaccine received first dose in 03/2019, second dose in 05/2019 4.Cancer screening I) Colon cancer/dysplasia surveillance: No evidence of dysplasia to date Recommend colonoscopy for dysplasia surveillance with chromoendoscopy in 2023 II) Cervical cancer: Annual Pap smear up to date, reportedly negative III) Skin cancer - counseled about annual skin exam by dermatology and skin protection in summer using sun screen SPF > 55, clothing.  She has history of squamous cell carcinoma of the skin about 5 to 6 years ago.  Seen by dermatologist within last 1 year.  Recommend her to have annual surveillance of her skin by dermatologist 5.Bone health Vitamin D status:  vitamin D levels normal Bone density testing: Not done 5. Labs:  With every other infusion 6. Smoking: None 7. NSAIDs and Antibiotics use: None   Follow up in 3 months   Cephas Darby, MD

## 2021-01-08 ENCOUNTER — Encounter: Payer: Managed Care, Other (non HMO) | Admitting: Dermatology

## 2021-02-13 ENCOUNTER — Other Ambulatory Visit: Payer: Self-pay | Admitting: Physician Assistant

## 2021-02-13 DIAGNOSIS — F419 Anxiety disorder, unspecified: Secondary | ICD-10-CM

## 2021-02-14 NOTE — Telephone Encounter (Signed)
Requested medication (s) are due for refill today: yes  Requested medication (s) are on the active medication list: yes  Last refill:  01/10/2021  Future visit scheduled: no  Notes to clinic:  overdue for 6 month follow up Message sent to call office and schedule    Requested Prescriptions  Pending Prescriptions Disp Refills   sertraline (ZOLOFT) 50 MG tablet [Pharmacy Med Name: SERTRALINE HCL 50 MG TABLET] 45 tablet 3    Sig: TAKE 1 TABLET (50 MG TOTAL) BY MOUTH DAILY FOR 14 DAYS, THEN 1.5 TABLETS (75 MG TOTAL) DAILY.      Psychiatry:  Antidepressants - SSRI Failed - 02/13/2021  1:26 AM      Failed - Valid encounter within last 6 months    Recent Outpatient Visits           1 year ago Cervical cancer screening   [facility] Trinna Post, [place]   1 year ago Annual physical exam   [facility] Carles Collet M, [place]   1 year ago Need for shingles vaccine   [facility] [place], Wendee Beavers, [place]   1 year ago Need for hepatitis B vaccination   [facility] Carles Collet M, [place]   2 years ago [facility] discharge follow-up   [place], Wendee Beavers, [place]

## 2021-02-27 ENCOUNTER — Other Ambulatory Visit: Payer: Self-pay

## 2021-02-27 ENCOUNTER — Ambulatory Visit (INDEPENDENT_AMBULATORY_CARE_PROVIDER_SITE_OTHER): Payer: Managed Care, Other (non HMO) | Admitting: Dermatology

## 2021-02-27 DIAGNOSIS — L820 Inflamed seborrheic keratosis: Secondary | ICD-10-CM | POA: Diagnosis not present

## 2021-02-27 DIAGNOSIS — L814 Other melanin hyperpigmentation: Secondary | ICD-10-CM

## 2021-02-27 DIAGNOSIS — L821 Other seborrheic keratosis: Secondary | ICD-10-CM

## 2021-02-27 MED ORDER — CLOBETASOL PROPIONATE 0.05 % EX SOLN: CUTANEOUS | 0 refills | Status: DC

## 2021-02-27 NOTE — Progress Notes (Signed)
Follow-Up Visit   Subjective  Natasha Mueller is a 55 y.o. female who presents for the following: Rash (Patient here for rash that started on arms and hands 3-4 weeks ago. Rash has spread to legs, back, abdomen. She is using triamcinolone 0.5% ointment, which helps itching some. She is not on any new medicines, no new detergents, shampoos, lotions. She hasn't been outside much or working in the yard.). No history of eczema. No recent illness. Patient noticed these areas after her last infusion of Entyvio for ulcerative colitis. She was unsure if the two were related since she has been on this for 2 years.   The following portions of the chart were reviewed this encounter and updated as appropriate:       Review of Systems:  No other skin or systemic complaints except as noted in HPI or Assessment and Plan.  Objective  Well appearing patient in no apparent distress; mood and affect are within normal limits.  A focused examination was performed including face, arms, legs, back. Relevant physical exam findings are noted in the Assessment and Plan.  Objective  legs, arms, back, L thigh x 2, L calf x 8, R calf x 6, mid back x 26, R post upper arm x 1 (43): Erythematous keratotic or waxy stuck-on papule     Assessment & Plan  Inflamed seborrheic keratosis (43) legs, arms, back, L thigh x 2, L calf x 8, R calf x 6, mid back x 26, R post upper arm x 1  Extensive with pruritus Start Clobetasol solution mixed in CeraVe Cream Apply all over BID until itch improved dsp 50 mL. Avoid face/groin/axilla.   May spot treat itchy spots not treated with cryotherapy with TMC 0.5% ointment bid, pt has.  Prior to procedure, discussed risks of blister formation, small wound, skin dyspigmentation, or rare scar following cryotherapy.   Topical steroids (such as triamcinolone, fluocinolone, fluocinonide, mometasone, clobetasol, halobetasol, betamethasone, hydrocortisone) can cause thinning and lightening of  the skin if they are used for too long in the same area. Your physician has selected the right strength medicine for your problem and area affected on the body. Please use your medication only as directed by your physician to prevent side effects.    clobetasol (TEMOVATE) 0.05 % external solution - legs, arms, back, L thigh x 2, L calf x 8, R calf x 6, mid back x 26, R post upper arm x 1  Destruction of lesion - legs, arms, back, L thigh x 2, L calf x 8, R calf x 6, mid back x 26, R post upper arm x 1 Complexity: extensive   Destruction method: cryotherapy   Informed consent: discussed and consent obtained   Lesion destroyed using liquid nitrogen: Yes   Region frozen until ice ball extended beyond lesion: Yes   Outcome: patient tolerated procedure well with no complications   Post-procedure details: wound care instructions given    Seborrheic Keratoses - Stuck-on, waxy, tan-brown papules and/or plaques  - Benign-appearing - Discussed benign etiology and prognosis. - Observe - Call for any changes  Lentigines - Scattered tan macules - Due to sun exposure - Benign-appering, observe - Recommend daily broad spectrum sunscreen SPF 30+ to sun-exposed areas, reapply every 2 hours as needed. - Call for any changes   Return in about 1 month (around 03/30/2021) for ISKs.  Lindi Adie, CMA, am acting as scribe for Brendolyn Patty, MD .  Documentation: I have reviewed the above documentation  for accuracy and completeness, and I agree with the above.  Brendolyn Patty MD

## 2021-02-27 NOTE — Patient Instructions (Addendum)
Skin Care  Buy ONE 16oz jar of CeraVe moisturizing cream  [facility], [facility] (no prescription needed)  Costs about $15 per jar   Use as a moisturizer as needed (CeraVe SA). Can be applied to any area of the body. Use twice daily to unaffected areas.   Jar #1: Pour one 8m bottle of clobetasol 0.05% solution into jar, mix well. Label this jar to indicate the medication has been added. Use twice daily to affected areas. Do not apply to face, groin or underarms.  Moisturizer may burn or sting initially. Try for at least 4 weeks.    If you have any questions or concerns for your doctor, please call our main line at [phone] press option 4 to reach your doctor's medical assistant. If no one answers, please leave a voicemail as directed and we will return your call as soon as possible. Messages left after 4 pm will be answered the following business day.   You may also send [place] message via [facility] [place] We typically respond to MyChart messages within 1-2 business days.  For prescription refills, please ask your pharmacy to contact our office. Our fax number is [phone]  If you have an urgent issue when the clinic is closed that cannot wait until the next business day, you can page your doctor at the number below.    Please note that while we do our best to be available for urgent issues outside of office hours, we are not available 24/7.   If you have an urgent issue and are unable to reach [place] you may choose to seek medical care at your doctor's office, retail clinic, urgent care center, or emergency room.  If you have a medical emergency, please immediately call 911 or go to the emergency department.  Pager Numbers  - Dr. KNehemiah Massed [phone] - Dr. MLaurence Ferrari [phone] - Dr. SNicole Kindred [phone] In the event of inclement weather, please call our main line at [phone] an update on the status of any delays or closures.  Dermatology Medication Tips: Please  keep the boxes that topical medications come in in order to help keep track of the instructions about where and how to use these. Pharmacies typically print the medication instructions only on the boxes and not directly on the medication tubes.   If your medication is too expensive, please contact our office at [phone] 4 or send [place] message through [place]   We are unable to tell what your co-pay for medications will be in advance as this is different depending on your insurance coverage. However, we may be able to find a substitute medication at lower cost or fill out paperwork to get insurance to cover a needed medication.   If a prior authorization is required to get your medication covered by your insurance company, please allow [place]-2 business days to complete this process.  Drug prices often vary depending on where the prescription is filled and some pharmacies may offer cheaper prices.  The website www.goodrx.com contains coupons for medications through different pharmacies. The prices here do not account for what the cost may be with help from insurance (it may be cheaper with your insurance), but the website can give you the price if you did not use any insurance.  - You can print the associated coupon and take it with your prescription to the pharmacy.  - You may also stop by our office during regular business hours and pick up a GoodRx  coupon card.  - If you need your prescription sent electronically to a different pharmacy, notify our office through [facility] or by phone at [phone] option 4.

## 2021-02-28 ENCOUNTER — Encounter: Payer: Self-pay | Admitting: Dermatology

## 2021-03-01 ENCOUNTER — Other Ambulatory Visit: Payer: Self-pay

## 2021-03-01 MED ORDER — CLOBETASOL PROPIONATE 0.05 % EX SOLN: CUTANEOUS | 0 refills | Status: DC

## 2021-03-15 ENCOUNTER — Other Ambulatory Visit: Payer: Self-pay | Admitting: Family Medicine

## 2021-03-15 DIAGNOSIS — F419 Anxiety disorder, unspecified: Secondary | ICD-10-CM

## 2021-03-15 NOTE — Telephone Encounter (Signed)
Requested medications are due for refill today.  yes  Requested medications are on the active medications list.  yes  Last refill. 02/15/2021  Future visit scheduled.   no  Notes to clinic.  Already given courtesy refill. Please advise.

## 2021-03-27 ENCOUNTER — Encounter: Payer: Self-pay | Admitting: Dermatology

## 2021-03-29 ENCOUNTER — Other Ambulatory Visit: Payer: Self-pay

## 2021-04-04 ENCOUNTER — Other Ambulatory Visit: Payer: Self-pay

## 2021-04-04 ENCOUNTER — Ambulatory Visit (INDEPENDENT_AMBULATORY_CARE_PROVIDER_SITE_OTHER): Payer: Managed Care, Other (non HMO) | Admitting: Dermatology

## 2021-04-04 DIAGNOSIS — R21 Rash and other nonspecific skin eruption: Secondary | ICD-10-CM

## 2021-04-04 DIAGNOSIS — L820 Inflamed seborrheic keratosis: Secondary | ICD-10-CM | POA: Diagnosis not present

## 2021-04-04 DIAGNOSIS — L308 Other specified dermatitis: Secondary | ICD-10-CM | POA: Diagnosis not present

## 2021-04-04 MED ORDER — CLOBETASOL PROPIONATE 0.05 % EX SOLN: CUTANEOUS | 1 refills | Status: DC

## 2021-04-04 NOTE — Progress Notes (Signed)
Follow-Up Visit   Subjective  Natasha Mueller is a 55 y.o. female who presents for the following: Skin Problem (Patient here for 1 month follow-up ISKs. She flared around 1 1/2 weeks ago and bumps/itching were all over. They are improving now. She ran out of clobetasol/CeraVe mix. It helped with itch. She is using clobetasol solution for spot treatment to [facility] and takes [facility] every 4 hrs for itch. ). Patient has seasonal allergies, no asthma. No known exposure to plants/poison ivy. Rash first started 2 months ago. Patient noticed these areas after her last infusion of Entyvio for ulcerative colitis. She was unsure if the two were related since she has been on this for 2 years.   The following portions of the chart were reviewed this encounter and updated as appropriate:        Review of Systems:  No other skin or systemic complaints except as noted in HPI or Assessment and Plan.  Objective  Well appearing patient in no apparent distress; mood and affect are within normal limits.  A focused examination was performed including arms, legs, back. Relevant physical exam findings are noted in the Assessment and Plan.  Back Erythematous keratotic or waxy stuck-on papules  Left Lateral Thigh, Left Lower Back Pink scaly patches on thighs, back.   Assessment & Plan  Inflamed seborrheic keratosis Back  Continue clobetasol/CeraVe mix BID all over prn itch.  Topical steroids (such as triamcinolone, fluocinolone, fluocinonide, mometasone, clobetasol, halobetasol, betamethasone, hydrocortisone) can cause thinning and lightening of the skin if they are used for too long in the same area. Your physician has selected the right strength medicine for your problem and area affected on the body. Please use your medication only as directed by your physician to prevent side effects.    Related Medications clobetasol (TEMOVATE) 0.05 % external solution Patient to mix in 1 jar of CeraVe Cream and apply  all over twice daily until itch improved. Avoid face.  Rash and other nonspecific skin eruption Left Lateral Thigh; Left Lower Back  Itchy rash >2 months, unclear etiology, biopsy today  D/c Suave soap.  Start Scientist, clinical (histocompatibility and immunogenetics).  Restart clobetasol/CeraVe cream mixture Apply BID all over until itch improved dsp 44m 1Rf.  Discussed TRUE Test patch testing on follow-up if back clear.  Discussed oral Prednisone, patient defers.  Topical steroids (such as triamcinolone, fluocinolone, fluocinonide, mometasone, clobetasol, halobetasol, betamethasone, hydrocortisone) can cause thinning and lightening of the skin if they are used for too long in the same area. Your physician has selected the right strength medicine for your problem and area affected on the body. Please use your medication only as directed by your physician to prevent side effects.    Skin / nail biopsy - Left Lateral Thigh Type of biopsy: punch   Informed consent: discussed and consent obtained   Patient was prepped and draped in usual sterile fashion: Area prepped with alcohol. Anesthesia: the lesion was anesthetized in a standard fashion   Anesthetic:  1% lidocaine w/ epinephrine 1-100,000 buffered w/ 8.4% NaHCO3 Punch size:  3.5 mm Suture type: nylon   Suture removal (days):  7 Hemostasis achieved with: suture and pressure   Outcome: patient tolerated procedure well   Post-procedure details: wound care instructions given   Post-procedure details comment:  Ointment and pressure dressing applied  Skin / nail biopsy - Left Lower Back Type of biopsy: punch   Informed consent: discussed and consent obtained   Patient was prepped and draped  in usual sterile fashion: Area prepped with alcohol. Anesthesia: the lesion was anesthetized in a standard fashion   Anesthetic:  1% lidocaine w/ epinephrine 1-100,000 buffered w/ 8.4% NaHCO3 Punch size:  3.5 mm Suture type: nylon   Suture removal (days):  7 Hemostasis  achieved with: suture and pressure   Outcome: patient tolerated procedure well   Post-procedure details: wound care instructions given   Post-procedure details comment:  Ointment and pressure dressing applied  Specimen 1 - Surgical pathology Differential Diagnosis: Contact Dermatitis vs Atopic Dermatitis vs Drug Rash Check Margins: No Pink scaly patches on thighs, back.  Specimen 2 - Surgical pathology Differential Diagnosis: Contact Dermatitis vs Atopic Dermatitis vs Drug Rash Check Margins: No Pink scaly patches on thighs, back.  Return in about 6 days (around 04/10/2021) for biopsy follow-up.   IJamesetta Orleans, CMA, am acting as scribe for Brendolyn Patty, MD .  Documentation: I have reviewed the above documentation for accuracy and completeness, and I agree with the above.  Brendolyn Patty MD

## 2021-04-04 NOTE — Patient Instructions (Addendum)
Wound Care Instructions  1. Cleanse wound gently with soap and water once a day then pat dry with clean gauze. Apply a thing coat of Petrolatum (petroleum jelly, "Vaseline") over the wound (unless you have an allergy to this). We recommend that you use a new, sterile tube of Vaseline. Do not pick or remove scabs. Do not remove the yellow or white "healing tissue" from the base of the wound.  2. Cover the wound with fresh, clean, nonstick gauze and secure with paper tape. You may use Band-Aids in place of gauze and tape if the would is small enough, but would recommend trimming much of the tape off as there is often too much. Sometimes Band-Aids can irritate the skin.  3. You should call the office for your biopsy report after 1 week if you have not already been contacted.  4. If you experience any problems, such as abnormal amounts of bleeding, swelling, significant bruising, significant pain, or evidence of infection, please call the office immediately.  5. FOR ADULT SURGERY PATIENTS: If you need something for pain relief you may take 1 extra strength Tylenol (acetaminophen) AND 2 Ibuprofen (253m each) together every 4 hours as needed for pain. (do not take these if you are allergic to them or if you have a reason you should not take them.) Typically, you may only need pain medication for 1 to 3 days.    Topical steroids (such as triamcinolone, fluocinolone, fluocinonide, mometasone, clobetasol, halobetasol, betamethasone, hydrocortisone) can cause thinning and lightening of the skin if they are used for too long in the same area. Your physician has selected the right strength medicine for your problem and area affected on the body. Please use your medication only as directed by your physician to prevent side effects.    If you have any questions or concerns for your doctor, please call our main line at [phone] press option 4 to reach your doctor's medical assistant. If no one answers,  please leave a voicemail as directed and we will return your call as soon as possible. Messages left after 4 pm will be answered the following business day.   You may also send [place] message via [place] We typically respond to MyChart messages within 1-2 business days.  For prescription refills, please ask your pharmacy to contact our office. Our fax number is [phone]  If you have an urgent issue when the clinic is closed that cannot wait until the next business day, you can page your doctor at the number below.    Please note that while we do our best to be available for urgent issues outside of office hours, we are not available 24/7.   If you have an urgent issue and are unable to reach [place] you may choose to seek medical care at your doctor's office, retail clinic, urgent care center, or emergency room.  If you have a medical emergency, please immediately call 911 or go to the emergency department.  Pager Numbers  - Dr. KNehemiah Massed [phone] - Dr. MLaurence Ferrari [phone] - Dr. SNicole Kindred [phone] In the event of inclement weather, please call our main line at [phone] an update on the status of any delays or closures.  Dermatology Medication Tips: Please keep the boxes that topical medications come in in order to help keep track of the instructions about where and how to use these. Pharmacies typically print the medication instructions only on the boxes and not directly on the medication tubes.  If your medication is too expensive, please contact our office at [phone] option 4 or send [place] a message through [place].   We are unable to tell what your co-pay for medications will be in advance as this is different depending on your insurance coverage. However, we may be able to find a substitute medication at lower cost or fill out paperwork to get insurance to cover a needed medication.   If a prior authorization is required to get your medication covered by your  insurance company, please allow [place] 1-2 business days to complete this process.  Drug prices often vary depending on where the prescription is filled and some pharmacies may offer cheaper prices.  The website www.goodrx.com contains coupons for medications through different pharmacies. The prices here do not account for what the cost may be with help from insurance (it may be cheaper with your insurance), but the website can give you the price if you did not use any insurance.  - You can print the associated coupon and take it with your prescription to the pharmacy.  - You may also stop by our office during regular business hours and pick up a GoodRx coupon card.  - If you need your prescription sent electronically to a different pharmacy, notify our office through [facility] or by phone at [phone] option 4.

## 2021-04-10 ENCOUNTER — Ambulatory Visit (INDEPENDENT_AMBULATORY_CARE_PROVIDER_SITE_OTHER): Payer: Managed Care, Other (non HMO) | Admitting: Dermatology

## 2021-04-10 ENCOUNTER — Other Ambulatory Visit: Payer: Self-pay

## 2021-04-10 DIAGNOSIS — R21 Rash and other nonspecific skin eruption: Secondary | ICD-10-CM

## 2021-04-10 DIAGNOSIS — L820 Inflamed seborrheic keratosis: Secondary | ICD-10-CM

## 2021-04-10 MED ORDER — PIMECROLIMUS 1 % EX CREA: TOPICAL_CREAM | CUTANEOUS | 1 refills | Status: DC

## 2021-04-10 NOTE — Patient Instructions (Addendum)

## 2021-04-10 NOTE — Progress Notes (Signed)
Follow-Up Visit   Subjective  Natasha Mueller is a 55 y.o. female who presents for the following: Follow-up (Patient here for 6 day follow-up punch biopsies. She is a little better using clobetasol/CeraVe mix twice daily. Itch is worse at night. She takes Benadryl at night, which helps her sleep. ). She has a lot of Sks that have been inflamed by the rash and are very itchy.   The following portions of the chart were reviewed this encounter and updated as appropriate:        Review of Systems:  No other skin or systemic complaints except as noted in HPI or Assessment and Plan.  Objective  Well appearing patient in no apparent distress; mood and affect are within normal limits.  A focused examination was performed including face, trunk, extremities. Relevant physical exam findings are noted in the Assessment and Plan.  legs Pink scaly patches on legs, arms; erythema and scale of the palms.  R forearm x 2, R hand/wrist x 5, L hand x 3, L forearm x 1, R thigh x 3, L thigh x 2, back x 37, L shoulder x 2 (Total 55) (55) Erythematous keratotic or waxy stuck-on papule   Assessment & Plan  Rash legs  Wound cleansed, sutures removed, wound cleansed and steri strips applied.   Contact Dermatitis vs Atopic Dermatitis vs Drug Reaction. Pathology results not received back yet.   Some improvement. Continue clobetasol/CeraVe mix qd/bid AA rash. Avoid face, groin, axilla.  Start Elidel Cream Apply to AA rash QD/BID dsp 100g 1Rf.  Discussed Dupixent injections, pending biopsy results.   Discussed TRUE Test Patch Test. Plan once back healed from cryotherapy of ISKs.  Topical steroids (such as triamcinolone, fluocinolone, fluocinonide, mometasone, clobetasol, halobetasol, betamethasone, hydrocortisone) can cause thinning and lightening of the skin if they are used for too long in the same area. Your physician has selected the right strength medicine for your problem and area affected on the  body. Please use your medication only as directed by your physician to prevent side effects.    pimecrolimus (ELIDEL) 1 % cream - legs Apply to affected areas rash 1-2 times daily until improved.  Inflamed seborrheic keratosis R forearm x 2, R hand/wrist x 5, L hand x 3, L forearm x 1, R thigh x 3, L thigh x 2, back x 37, L shoulder x 2 (Total 55)  Destruction of lesion - R forearm x 2, R hand/wrist x 5, L hand x 3, L forearm x 1, R thigh x 3, L thigh x 2, back x 37, L shoulder x 2 (Total 55)  Destruction method: cryotherapy   Informed consent: discussed and consent obtained   Lesion destroyed using liquid nitrogen: Yes   Region frozen until ice ball extended beyond lesion: Yes   Outcome: patient tolerated procedure well with no complications   Post-procedure details: wound care instructions given   Additional details:  Prior to procedure, discussed risks of blister formation, small wound, skin dyspigmentation, or rare scar following cryotherapy. Recommend Vaseline ointment to treated areas while healing.   Related Medications clobetasol (TEMOVATE) 0.05 % external solution Patient to mix in 1 jar of CeraVe Cream and apply all over twice daily until itch improved. Avoid face.  Return in about 4 weeks (around 05/08/2021) for f/u dermatitis, patch test.  I, Jamesetta Orleans, CMA, am acting as scribe for Brendolyn Patty, MD .  Documentation: I have reviewed the above documentation for accuracy and completeness, and I agree with  the above.  Brendolyn Patty MD

## 2021-04-11 ENCOUNTER — Telehealth: Payer: Self-pay

## 2021-04-11 MED ORDER — DUPIXENT 300 MG/2ML ~~LOC~~ SOAJ: 600.0000 mg | Freq: Once | SUBCUTANEOUS | 0 refills | Status: AC

## 2021-04-11 MED ORDER — DUPIXENT 300 MG/2ML ~~LOC~~ SOAJ: 300.0000 mg | SUBCUTANEOUS | 5 refills | Status: DC

## 2021-04-11 NOTE — Telephone Encounter (Signed)
Advised pt of bx results.  Advised we would do patch testing on f/u and we would send Dupixent to [facility] to get pa started./sh

## 2021-04-11 NOTE — Telephone Encounter (Signed)
-----   Message from Brendolyn Patty, MD sent at 04/11/2021  8:27 AM EDT ----- 1. Skin , left lateral thigh SPONGIOTIC DERMATITIS, SEE DESCRIPTION 2. Skin , left lower back PSORIASIFORM SPONGIOTIC DERMATITIS, SEE DESCRIPTION  Dermatitis, will plan on patch test on follow-up and start PA for Dupixent - please call patient

## 2021-04-26 ENCOUNTER — Other Ambulatory Visit: Payer: Self-pay

## 2021-04-26 ENCOUNTER — Other Ambulatory Visit: Payer: Self-pay | Admitting: Family Medicine

## 2021-04-26 ENCOUNTER — Telehealth: Payer: Self-pay | Admitting: Physician Assistant

## 2021-04-26 DIAGNOSIS — Z79890 Hormone replacement therapy (postmenopausal): Secondary | ICD-10-CM

## 2021-04-26 DIAGNOSIS — N951 Menopausal and female climacteric states: Secondary | ICD-10-CM

## 2021-04-26 DIAGNOSIS — F419 Anxiety disorder, unspecified: Secondary | ICD-10-CM

## 2021-04-26 MED ORDER — DUPIXENT 300 MG/2ML ~~LOC~~ SOAJ: 300.0000 mg | SUBCUTANEOUS | 5 refills | Status: DC

## 2021-04-26 NOTE — Telephone Encounter (Signed)
  Notes to clinic: courtesy refill already given Patient has not contact office to scheduled    Requested Prescriptions  Pending Prescriptions Disp Refills   sertraline (ZOLOFT) 50 MG tablet [Pharmacy Med Name: SERTRALINE HCL 50 MG TABLET] 23 tablet 0    Sig: Take 1.5 tablets (75 mg total) by mouth daily. Please schedule office visit before any future refills.      Psychiatry:  Antidepressants - SSRI Failed - 04/26/2021  8:43 AM      Failed - Valid encounter within last 6 months    Recent Outpatient Visits           1 year ago Cervical cancer screening   [facility] Trinna Post, [place]   1 year ago Annual physical exam   [facility] [place] Collet M, [place]   1 year ago Need for shingles vaccine   [facility] [place], Wendee Beavers, [place]   1 year ago Need for hepatitis B vaccination   [facility] Carles Collet M, [place]   2 years ago Hospital discharge follow-up   [facility] Trinna Post, [place]       Future Appointments             In 1 week Brendolyn Patty, MD [place]

## 2021-04-26 NOTE — Progress Notes (Signed)
Per Iantha Fallen, rx needs sent to [facility]

## 2021-04-26 NOTE — Telephone Encounter (Signed)
[facility] for the following medications:  progesterone (PROMETRIUM) 100 MG capsule    Please advise.

## 2021-05-02 ENCOUNTER — Telehealth: Payer: Self-pay | Admitting: Physician Assistant

## 2021-05-02 DIAGNOSIS — Z79890 Hormone replacement therapy (postmenopausal): Secondary | ICD-10-CM

## 2021-05-02 DIAGNOSIS — N951 Menopausal and female climacteric states: Secondary | ICD-10-CM

## 2021-05-02 NOTE — Telephone Encounter (Signed)
[facility] faxed refill request for the following medications:  estradiol (ESTRACE) 0.5 MG tablet   progesterone (PROMETRIUM) 100 MG capsule    Please advise.

## 2021-05-07 ENCOUNTER — Ambulatory Visit (INDEPENDENT_AMBULATORY_CARE_PROVIDER_SITE_OTHER): Payer: Managed Care, Other (non HMO) | Admitting: Dermatology

## 2021-05-07 ENCOUNTER — Other Ambulatory Visit: Payer: Self-pay

## 2021-05-07 DIAGNOSIS — L821 Other seborrheic keratosis: Secondary | ICD-10-CM | POA: Diagnosis not present

## 2021-05-07 DIAGNOSIS — L309 Dermatitis, unspecified: Secondary | ICD-10-CM | POA: Diagnosis not present

## 2021-05-07 DIAGNOSIS — L820 Inflamed seborrheic keratosis: Secondary | ICD-10-CM

## 2021-05-07 NOTE — Patient Instructions (Addendum)
Cryotherapy Aftercare  Wash gently with soap and water everyday.   Apply Vaseline and Band-Aid daily until healed.   Topical steroids (such as triamcinolone, fluocinolone, fluocinonide, mometasone, clobetasol, halobetasol, betamethasone, hydrocortisone) can cause thinning and lightening of the skin if they are used for too long in the same area. Your physician has selected the right strength medicine for your problem and area affected on the body. Please use your medication only as directed by your physician to prevent side effects.   Dupilumab (Dupixent) is a treatment given by injection for adults with moderate-to-severe atopic dermatitis. Goal is control of skin condition, not cure. It is given as 2 injections at the first dose followed by 1 injection ever 2 weeks thereafter.  Potential side effects include allergic reaction, herpes infections, injection site reactions and conjunctivitis (inflammation of the eyes).  The use of Dupixent requires long term medication management, including periodic office visits.    If you have any questions or concerns for your doctor, please call our main line at [phone] and press option 4 to reach your doctor's medical assistant. If no one answers, please leave a voicemail as directed and we will return your call as soon as possible. Messages left after 4 pm will be answered the following business day.   You may also send [place] a message via [place]. We typically respond to MyChart messages within 1-2 business days.  For prescription refills, please ask your pharmacy to contact our office. Our fax number is [phone].  If you have an urgent issue when the clinic is closed that cannot wait until the next business day, you can page your doctor at the number below.    Please note that while we do our best to be available for urgent issues outside of office hours, we are not available 24/7.   If you have an urgent issue and are unable to reach [place], you  may choose to seek medical care at your doctor's office, retail clinic, urgent care center, or emergency room.  If you have a medical emergency, please immediately call 911 or go to the emergency department.  Pager Numbers  - Dr. Nehemiah Massed: [phone]  - Dr. Laurence Ferrari: [phone]  - Dr. Nicole Kindred: [phone]  In the event of inclement weather, please call our main line at [phone] for an update on the status of any delays or closures.  Dermatology Medication Tips: Please keep the boxes that topical medications come in in order to help keep track of the instructions about where and how to use these. Pharmacies typically print the medication instructions only on the boxes and not directly on the medication tubes.   If your medication is too expensive, please contact our office at [phone] option 4 or send [place] a message through [place].   We are unable to tell what your co-pay for medications will be in advance as this is different depending on your insurance coverage. However, we may be able to find a substitute medication at lower cost or fill out paperwork to get insurance to cover a needed medication.   If a prior authorization is required to get your medication covered by your insurance company, please allow [place] 1-2 business days to complete this process.  Drug prices often vary depending on where the prescription is filled and some pharmacies may offer cheaper prices.  The website www.goodrx.com contains coupons for medications through different pharmacies. The prices here do not account for what the cost may be with help from insurance (it may  be cheaper with your insurance), but the website can give you the price if you did not use any insurance.  - You can print the associated coupon and take it with your prescription to the pharmacy.  - You may also stop by our office during regular business hours and pick up a GoodRx coupon card.  - If you need your prescription sent  electronically to a different pharmacy, notify our office through  [facility] or by phone at [phone] option 4.

## 2021-05-07 NOTE — Progress Notes (Signed)
Follow-Up Visit   Subjective  Natasha Mueller is a 55 y.o. female who presents for the following: Follow-up (Patient here today for 4 week follow up on rash. Patient reports she has started dupixent and states rash has gotten much better. ). She also has had multiple ISKs frozen off and she has some additional itchy spots to treat.  The following portions of the chart were reviewed this encounter and updated as appropriate:       Objective  Well appearing patient in no apparent distress; mood and affect are within normal limits.  A focused examination was performed including back, bilateral shoulders, bilateral legs, bilateral arms and palms, face. Relevant physical exam findings are noted in the Assessment and Plan.  right popliteal x 1, right medial thigh x 1, right anterior thigh x 1, left lateral thigh x 2, left popliteal x 1, left medial calf x  1, right lower calf x 1, left medial canthus x 1, right shoulder x 7, (16) Erythematous keratotic or waxy stuck-on papule   Left Lower Leg - Posterior Erythema with hyperkeratosis on bilateral palms, back with erythema and xerosis   Assessment & Plan  Inflamed seborrheic keratosis right popliteal x 1, right medial thigh x 1, right anterior thigh x 1, left lateral thigh x 2, left popliteal x 1, left medial calf x  1, right lower calf x 1, left medial canthus x 1, right shoulder x 7,  Prior to procedure, discussed risks of blister formation, small wound, skin dyspigmentation, or rare scar following cryotherapy. Recommend Vaseline ointment to treated areas while healing.   Destruction of lesion - right popliteal x 1, right medial thigh x 1, right anterior thigh x 1, left lateral thigh x 2, left popliteal x 1, left medial calf x  1, right lower calf x 1, left medial canthus x 1, right shoulder x 7,  Destruction method: cryotherapy   Informed consent: discussed and consent obtained   Timeout:  patient name, date of birth, surgical site, and  procedure verified Lesion destroyed using liquid nitrogen: Yes   Region frozen until ice ball extended beyond lesion: Yes   Outcome: patient tolerated procedure well with no complications   Post-procedure details: wound care instructions given    Related Medications clobetasol (TEMOVATE) 0.05 % external solution Patient to mix in 1 jar of CeraVe Cream and apply all over twice daily until itch improved. Avoid face.  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   Continue clobetasol/CeraVe mix qd/bid AA prn rash. Avoid face, groin, axilla.   Continue  Elidel Cream Apply to AA rash QD/BID dsp 100g 1Rf.   Continue Dupixent injections qowk.  Dupilumab (Dupixent) is a treatment given by injection for adults with moderate-to-severe atopic dermatitis. Goal is control of skin condition, not cure. It is given as 2 injections at the first dose followed by 1 injection ever 2 weeks thereafter.  Potential side effects include allergic reaction, herpes infections, injection site reactions and conjunctivitis (inflammation of the eyes).  The use of Dupixent requires long term medication management, including periodic office visits.    Will consider TRUE Test Patch Test in future if patient not responding to dupixent injections.    Topical steroids (such as triamcinolone, fluocinolone, fluocinonide, mometasone, clobetasol, halobetasol, betamethasone, hydrocortisone) can cause thinning and lightening of the skin if they are used for too long in the same area. Your physician has selected the right strength medicine  for your problem and area affected on the body. Please use your medication only as directed by your physician to prevent side effects.      Seborrheic Keratoses - Stuck-on, waxy, tan-brown papules and/or plaques on back, arms, legs  - Benign-appearing - Discussed benign etiology and prognosis. - Observe - Call for any  changes   Return in about 3 months (around 08/07/2021) for dermatitis follow up. I, Ruthell Rummage, CMA, am acting as scribe for Brendolyn Patty, MD.  Documentation: I have reviewed the above documentation for accuracy and completeness, and I agree with the above.  Brendolyn Patty MD

## 2021-06-06 ENCOUNTER — Other Ambulatory Visit: Payer: Self-pay | Admitting: Dermatology

## 2021-06-06 DIAGNOSIS — L820 Inflamed seborrheic keratosis: Secondary | ICD-10-CM

## 2021-06-25 ENCOUNTER — Other Ambulatory Visit: Payer: Self-pay

## 2021-06-25 ENCOUNTER — Encounter: Payer: Self-pay | Admitting: Gastroenterology

## 2021-06-25 ENCOUNTER — Ambulatory Visit (INDEPENDENT_AMBULATORY_CARE_PROVIDER_SITE_OTHER): Payer: Managed Care, Other (non HMO) | Admitting: Gastroenterology

## 2021-06-25 VITALS — BP 133/82 | HR 88 | Temp 97.6°F | Ht 64.0 in | Wt 132.2 lb

## 2021-06-25 DIAGNOSIS — K5100 Ulcerative (chronic) pancolitis without complications: Secondary | ICD-10-CM | POA: Diagnosis not present

## 2021-06-25 MED ORDER — CLENPIQ 10-3.5-12 MG-GM -GM/160ML PO SOLN: 320.0000 mL | Freq: Once | ORAL | 0 refills | Status: AC

## 2021-06-25 NOTE — Progress Notes (Signed)
Cephas Darby, MD [address]  [place]  [place],  [address]  Main: [phone]  Fax: [phone]    Gastroenterology Consultation  Referring Provider:     Paulene Floor Primary Care Physician:  Paulene Floor Primary Gastroenterologist:  Dr. Cephas Darby Reason for Consultation:     Ulcerative pancolitis        HPI:   Natasha Mueller is a 55 y.o. female referred by Dr. Trinna Post, PA-C  for consultation & management of UC  Ulcerative colitis diagnosed in 2006, previously on 5-ASA compounds without any response, switched to 6-MP which patient has been taking intermittently.  Patient was recently admitted to [facility] on 01/11/2019 due to abdominal pain, fever and CT revealed inflammation in her colon.  She was empirically started on antibiotics after stool studies came back negative for infection including C. difficile.  She was also found to have elevated CRP 13.7, ESR 60 as well as new onset of normocytic anemia.  Because she reported that she was steroid resistant in the past, she was not started on steroids on admission.  Due to persistent symptoms of abdominal pain, diarrhea started her on IV Solu-Medrol.  Subsequently, she underwent colonoscopy on 01/15/2019 which revealed mild to moderate continuous inflammation, severe in the right colon compared to left colon, advanced to transverse colon only due to severity of inflammation.  She was improving after discontinuation of antibiotics, she was transitioned to p.o. prednisone and was discharged home.  Follow-up visit 05/06/2019 Patient started [facility], received a second induction dose so far.  She reports doing well overall other than mild abdominal discomfort, 3-4 loose bowel movements daily.  She has been gaining weight.  She denies nausea, vomiting, rectal bleeding, fatigue.  She is receiving home infusion of Entyvio and tolerating it very well.  She stopped mercaptopurine and not on  prednisone  Follow-up visit 08/05/2019 Patient continues to take Entyvio.  Received first maintenance dose about a week ago.  She is tolerating infusions well.  She noticed that by end of week 6, she had increased bowel frequency, up to 4 bowel movements a day associated with mild abdominal discomfort.  She denied any blood in the stool.  She also noticed large joint arthralgias as well as severe bilateral hip pain.  After the infusion, her GI symptoms resolved.  Currently, she is not experiencing any arthralgias.  She was wondering if the medication was wearing off by week 6 and if she needs it at a closer interval.  She does report fatigue and started taking vitamin B12 daily.  She also notices that she takes some time for clearing of her vision in the morning.  She does have glasses but has not seen an ophthalmologist.  She denies double vision, pain in her eyes, redness of the eyes.  Follow-up visit 11/29/20 Natasha Mueller has not seen me for more than a year.  She has history of panulcerative colitis, which is quiesced sent and currently maintained on Entyvio every 6 weeks.  She does not have any concerns today.  She is receiving home infusions.  She wants to know if she can get extra IV fluids after each infusion.  She does not have any other concerns otherwise.  She denies any GI symptoms.  She reports having regular bowel movements.  She is currently working from home.  She is getting maintenance labs every other infusion and have been unremarkable.  She prefers not to receive  COVID-19 vaccine  Follow-up visit 06/25/2021 Patient is here for follow-up of ulcerative colitis.  She is currently on Entyvio every 8 weeks.  Patient reports that she has been noticing 3 loose bowel movements daily associated with some abdominal cramps since increasing interval from 6 weeks to 8 weeks.  She also has been having difficulty with IV access for infusion.  She wants to know if she can switch to injectable medication.  Her  weight has been stable.  She is also diagnosed with atopic dermatitis, started on Dupixent about 2 months ago and reports that her skin lesions have been improving well   IBD diagnosis: Ulcerative colitis diagnosed in 2006   Disease course: -- 12/11/04: CC - abdominal pain & 10-15 episodes of diarrhea with frank blood per day x 3 months. -- 12/13/04: CSY - congested, erythematous, friable, granular, hemorrhagic, and ulcerated mucosa in rectum, sigmoid, splenic flexure, transverse, hepatic flexure, and ascending colon (path: active colitis with crypt abscess formation c/w IBD).  She was initially treated with Asacol and Lialda with no clinical response.  Later switched to 6-MP, initially 100 mg, gradually decreased to 50 mg as she said she was not tolerating it well.   She reports having intermittent symptoms for which she has been taking prednisone on and off as well as 6-MP as needed --12/2018: Admitted to [facility] due to flareup of ulcerative colitis, colonoscopy confirmed moderate to severe pancolitis. Entyvio started in 03/2019.  Off prednisone and 6-MP     Extra intestinal manifestations: None   IBD surgical history: None   Imaging:   MRE none CTE none CT abdomen and pelvis with contrast on 01/10/2019 1. Irregular thickening of the wall of the left transverse colon with mild associated inflammation, consistent with active inflammatory bowel disease. No abscess or extraluminal/free air. No evidence of bowel obstruction. Associated mild mesenteric adenopathy. 2. No other acute abnormality within the abdomen or pelvis. SBFT none   Procedures:   Colonoscopy -- 12/13/04: CSY - congested, erythematous, friable, granular, hemorrhagic, and ulcerated mucosa in rectum, sigmoid, splenic flexure, transverse, hepatic flexure, and ascending colon (path: active colitis with crypt abscess formation c/w IBD). -- 03/07/05: CSY - normal TI and cecum, 6-8 mm hyperplastic in transverse colon. -- 01/17/10: CSY  - sigmoid/descending/ascending diverticulosis, pseudopolyps in splenic flexure, internal hemorrhoids (path: colonic mucosa w/o pathologic abnormalities). 11/10/13: CSY - normal TI, 3 diminutive polyps at hepatic flexure (path: focal glandular hyperplasia and lymphoid aggregates), sigmoid diverticulosis, small internal hemorrhoids (random bx path: colonic mucosa focal glandular hyperplasia and lymphoid aggregates);  01/15/2019 - Diffuse moderate inflammation was found in the transverse colon secondary to colitis. Biopsied. - Diffuse mild inflammation was found in the descending colon secondary to colitis. - The sigmoid colon is normal. Biopsied. - Diffuse mild inflammation was found in the rectum secondary to colitis. Biopsied. - Diverticulosis in the sigmoid colon, in the descending colon and in the transverse colon. DIAGNOSIS:  A. COLON, TRANSVERSE; COLD BIOPSY:  - CHRONIC COLITIS WITH SEVERE ACTIVITY (CRYPT DESTRUCTION, CRYPT  ABSCESSES, AND CRYPTITIS).  - NEGATIVE FOR GRANULOMA, DYSPLASIA, AND MALIGNANCY.   B. COLON, DESCENDING; COLD BIOPSY:  - CHRONIC COLITIS WITH MODERATE ACTIVITY (CRYPTITIS AND CRYPT  ABSCESSES).  - NEGATIVE FOR GRANULOMA, DYSPLASIA, AND MALIGNANCY.   C. COLON, SIGMOID; COLD BIOPSY:  - CHRONIC COLITIS WITHOUT ACTIVITY.  - NEGATIVE FOR GRANULOMA, DYSPLASIA, AND MALIGNANCY.   D. RECTUM; COLD BIOPSY:  - CHRONIC PROCTITIS WITH OUT ACTIVITY.  - NEGATIVE FOR GRANULOMA, DYSPLASIA, AND MALIGNANCY.  Upper Endoscopy 11/10/13: EGD - normal esophagus, single fundic gland polyp, normal duodenum.  - The examined portion of the ileum was normal. - Normal mucosa in the entire examined colon. Chromoscopy performed. Biopsied. - Two small polyps in the ascending colon, removed with a cold snare. Resected and retrieved. - Diverticulosis in the sigmoid colon and in the descending colon. - Pseudopolyps in the descending colon. - The distal rectum and anal verge are normal on  retroflexion view. - Inactive (Mayo Score 0) ulcerative colitis, in remission, improved since the last examination.  DIAGNOSIS:  A. COLON, RIGHT, RANDOM; BIOPSIES:  - NEGATIVE FOR ACTIVE MUCOSAL COLITIS, DYSPLASIA AND MALIGNANCY.   B. COLON POLYPS X2, ASCENDING; BIOPSY:  - MINUTE TUBULAR ADENOMA AND A FRAGMENT OF POLYPOID BENIGN COLONIC  MUCOSA.  - NEGATIVE FOR HIGH-GRADE DYSPLASIA AND MALIGNANCY.   C. COLON, LEFT, RANDOM; BIOPSY:  - NEGATIVE FOR ACTIVE MUCOSAL COLITIS, DYSPLASIA AND MALIGNANCY.  - FEW REACTIVE LYMPHOID AGGREGATES.  - FEW FRAGMENTS WITH MUCOSAL HEMORRHAGE.   Past Medical History:  Diagnosis Date   Allergy    BRCA negative 2013   with BART   Family history of breast cancer    BRCA/Bart neg 2013; IBIS=15%   Squamous cell carcinoma of skin 10/31/2009   Left superior clavicle/medial shoulder   UC (ulcerative colitis) ([place])     Past Surgical History:  Procedure Laterality Date   CESAREAN SECTION     COLONOSCOPY WITH PROPOFOL N/A 01/15/2019   Procedure: COLONOSCOPY WITH PROPOFOL;  Surgeon: Lucilla Lame, MD;  Location: [facility] ENDOSCOPY;  Service: Endoscopy;  Laterality: N/A;   COLONOSCOPY WITH PROPOFOL N/A 08/27/2019   Procedure: COLONOSCOPY WITH PROPOFOL;  Surgeon: Lin Landsman, MD;  Location: [facility] ENDOSCOPY;  Service: Gastroenterology;  Laterality: N/A;    Current Outpatient Medications:    clobetasol (TEMOVATE) 0.05 % external solution, PATIENT TO MIX IN 1 JAR OF CERAVE CREAM AND APPLY ALL OVER TWICE DAILY UNTIL ITCH IMPROVED. AVOID FACE., Disp: 50 mL, Rfl: 1   Dupilumab (DUPIXENT) 300 MG/2ML SOPN, Inject 300 mg into the skin every 14 (fourteen) days. Starting at day 15 for maintenance., Disp: 4 mL, Rfl: 5   estradiol (ESTRACE) 0.5 MG tablet, Take 1 tablet (0.5 mg total) by mouth daily., Disp: 60 tablet, Rfl: 0   pimecrolimus (ELIDEL) 1 % cream, Apply to affected areas rash 1-2 times daily until improved., Disp: 100 g, Rfl: 1   progesterone (PROMETRIUM) 100  MG capsule, Take 1 capsule (100 mg total) by mouth daily., Disp: 60 capsule, Rfl: 0   Sod Picosulfate-Mag Ox-Cit Acd (CLENPIQ) 10-3.5-12 MG-GM -GM/160ML SOLN, Take 320 mLs by mouth once for 1 dose., Disp: 320 mL, Rfl: 0   triamcinolone ointment (KENALOG) 0.5 %, SMARTSIG:Sparingly Topical 3 Times Daily PRN, Disp: , Rfl:    vedolizumab (ENTYVIO) 300 MG injection, Inject 300 mg into the vein every 8 (eight) weeks., Disp: 1 each, Rfl: 6   sertraline (ZOLOFT) 50 MG tablet, Take 1.5 tablets (75 mg total) by mouth daily. Please schedule office visit before any future refills., Disp: 23 tablet, Rfl: 0    Family History  Problem Relation Age of Onset   Breast cancer Mother 51   Hypertension Mother    Atrial fibrillation Father    Bladder Cancer Father 53   Diabetes Sister    Breast cancer Sister    Atrial fibrillation Brother      Social History   Tobacco Use   Smoking status: Never   Smokeless tobacco: Never  Vaping  Use   Vaping Use: Never used  Substance Use Topics   Alcohol use: No   Drug use: No    Allergies as of 06/25/2021 - Review Complete 06/25/2021  Allergen Reaction Noted   Hyoscyamine sulfate Swelling 03/13/2015    Review of Systems:    All systems reviewed and negative except where noted in HPI.   Physical Exam:  BP 133/82 (BP Location: Left Arm, Patient Position: Sitting, Cuff Size: Normal)   Pulse 88   Temp 97.6 F (36.4 C) (Oral)   Ht 5' 4"  (1.626 m)   Wt 132 lb 4 oz (60 kg)   LMP 06/14/2016 (Exact Date)   BMI 22.70 kg/m  Patient's last menstrual period was 06/14/2016 (exact date).  General:   Alert,  Well-developed, well-nourished, pleasant and cooperative in NAD Head:  Normocephalic and atraumatic. Eyes:  Sclera clear, no icterus.   Conjunctiva pink. Ears:  Normal auditory acuity. Nose:  No deformity, discharge, or lesions. Mouth:  No deformity or lesions,oropharynx pink & moist. Neck:  Supple; no masses or thyromegaly. Lungs:  Respirations even and  unlabored.  Clear throughout to auscultation.   No wheezes, crackles, or rhonchi. No acute distress. Heart:  Regular rate and rhythm; no murmurs, clicks, rubs, or gallops. Abdomen:  Normal bowel sounds. Soft, non-tender and non-distended without masses, hepatosplenomegaly or hernias noted.  No guarding or rebound tenderness.   Rectal: Not performed Msk:  Symmetrical without gross deformities. Good, equal movement & strength bilaterally. Pulses:  Normal pulses noted. Extremities:  No clubbing or edema.  No cyanosis. Neurologic:  Alert and oriented x3;  grossly normal neurologically. Skin:  Intact without significant lesions or rashes. No jaundice. Psych:  Alert and cooperative. Normal mood and affect.  Imaging Studies: Reviewed  Assessment and Plan:   Natasha Mueller is a 55 y.o. female with history of ulcerative pancolitis since 2006, moderately severe, previously maintained on 6-MP, colonoscopy in 12/2018 confirmed exacerbation of ulcerative colitis responsive to prednisone, started Entyvio monotherapy in 03/2019, currently in clinical remission.   Ulcerative pancolitis: In histologic remission based on the colonoscopy in 07/2019, currently on Entyvio monotherapy, increased interval to every 8 weeks from 6 weeks since 11/2020.  She is experiencing loose bowel movements daily which is new Check fecal calprotectin levels Recommend colonoscopy with biopsies and dysplasia surveillance Patient will inform me if she has any breakthrough symptoms Check CBC and CMP  IBD Health Maintenance   1.TB status: QuantiFERON gold negative on 01/15/2019 2. Anemia: No evidence of anemia, normal iron and B12 stores 3.Immunizations: Hep A and B  received second dose, Influenza recommend annually, prevnar administered in 03/2019, pneumovax administered in 05/2019, Varicella unknown, Shingrix vaccine received first dose in 03/2019, second dose in 05/2019 4.Cancer screening I) Colon cancer/dysplasia surveillance: No  evidence of dysplasia to date Recommend colonoscopy for dysplasia surveillance with chromoendoscopy in 2023 II) Cervical cancer: Annual Pap smear up to date, reportedly negative III) Skin cancer - counseled about annual skin exam by dermatology and skin protection in summer using sun screen SPF > 22, clothing.  She has history of squamous cell carcinoma of the skin about 5 to 6 years ago.  Seen by dermatologist within last 1 year.  Recommend her to have annual surveillance of her skin by dermatologist.  Currently treated for atopic dermatitis with Dupixent 5.Bone health Vitamin D status:  vitamin D levels normal Bone density testing: Not done 5. Labs: Every 3 months 6. Smoking: None 7. NSAIDs and Antibiotics use: None  Follow up in 3 months   Cephas Darby, MD

## 2021-06-26 ENCOUNTER — Other Ambulatory Visit: Payer: Self-pay | Admitting: Gastroenterology

## 2021-06-26 LAB — COMPREHENSIVE METABOLIC PANEL
ALT: 12 IU/L (ref 0–32)
AST: 17 IU/L (ref 0–40)
Albumin/Globulin Ratio: 1.5 (ref 1.2–2.2)
Albumin: 4.4 g/dL (ref 3.8–4.9)
Alkaline Phosphatase: 69 IU/L (ref 44–121)
BUN/Creatinine Ratio: 15 (ref 9–23)
BUN: 11 mg/dL (ref 6–24)
Bilirubin Total: <0.2 mg/dL (ref 0.0–1.2)
CO2: 23 mmol/L (ref 20–29)
Calcium: 9.4 mg/dL (ref 8.7–10.2)
Chloride: 103 mmol/L (ref 96–106)
Creatinine, Ser: 0.71 mg/dL (ref 0.57–1.00)
Globulin, Total: 2.9 g/dL (ref 1.5–4.5)
Glucose: 117 mg/dL — ABNORMAL HIGH (ref 65–99)
Potassium: 3.9 mmol/L (ref 3.5–5.2)
Sodium: 139 mmol/L (ref 134–144)
Total Protein: 7.3 g/dL (ref 6.0–8.5)
eGFR: 99 mL/min/{1.73_m2} (ref >59)

## 2021-06-26 LAB — CBC
Hematocrit: 37.7 % (ref 34.0–46.6)
Hemoglobin: 12.8 g/dL (ref 11.1–15.9)
MCH: 30 pg (ref 26.6–33.0)
MCHC: 34 g/dL (ref 31.5–35.7)
MCV: 89 fL (ref 79–97)
Platelets: 281 10*3/uL (ref 150–450)
RBC: 4.26 x10E6/uL (ref 3.77–5.28)
RDW: 12.9 % (ref 11.7–15.4)
WBC: 6.7 10*3/uL (ref 3.4–10.8)

## 2021-06-29 ENCOUNTER — Encounter: Payer: Self-pay | Admitting: Gastroenterology

## 2021-06-30 LAB — CALPROTECTIN, FECAL: Calprotectin, Fecal: <16 ug/g (ref 0–120)

## 2021-07-03 ENCOUNTER — Telehealth: Payer: Self-pay

## 2021-07-03 ENCOUNTER — Encounter: Payer: Self-pay | Admitting: Dermatology

## 2021-07-03 MED ORDER — RINVOQ 45 MG PO TB24: 1.0000 | ORAL_TABLET | Freq: Every day | ORAL | 1 refills | Status: DC

## 2021-07-03 MED ORDER — RINVOQ 15 MG PO TB24: 1.0000 | ORAL_TABLET | Freq: Every day | ORAL | 5 refills | Status: DC

## 2021-07-03 NOTE — Telephone Encounter (Signed)
Called [facility] and cancel the medication for Humira sent through fax for the Rinvoq 60m and 151m Filled out the Rinvoq complete form. Submitted PA through cover my meds for the [id number] the PA questions it asked if she has tried Humira first she has not will wait on decision from insurance company

## 2021-07-03 NOTE — Telephone Encounter (Signed)
Submitted PA through cover my meds for Patient Humira Starter and maintenance does . Patient will be receiving the medication through [facility]

## 2021-07-04 ENCOUNTER — Telehealth: Payer: Self-pay

## 2021-07-04 NOTE — Telephone Encounter (Signed)
Patient called to cancel procedure on 07/18/2021 and patient did not want to reschedule at all right now

## 2021-07-09 ENCOUNTER — Telehealth: Payer: Self-pay

## 2021-07-09 ENCOUNTER — Encounter: Payer: Self-pay | Admitting: Gastroenterology

## 2021-07-09 NOTE — Telephone Encounter (Signed)
Faxed appeal to insurance company.

## 2021-07-09 NOTE — Telephone Encounter (Signed)
Patient insurance company denied patient to be on Rinvoq they want patient to try Humira first.

## 2021-07-09 NOTE — Telephone Encounter (Signed)
You can write a letter and fax appeal to [phone] Do Expedited appeals.

## 2021-07-09 NOTE — Telephone Encounter (Signed)
They are faxing [place] the denial letter so we can do appeal on medication

## 2021-07-09 NOTE — Telephone Encounter (Signed)
Lindajo Royal with Rinvoq complete called to find out about this patient medication process. She asked if we have submitted a PA. Informed her that we have but we got a denial letter due to patient not trying Humira first. Informed her that the provider was going to submit a appeal on the medication but has to write the letter. She said after the appeal is submitted they can bridge the patient so the patient gets the medication till it is approved by insurance. We can call jennifer back at [phone]

## 2021-07-18 ENCOUNTER — Ambulatory Visit: Admit: 2021-07-18 | Payer: Managed Care, Other (non HMO) | Admitting: Gastroenterology

## 2021-07-18 SURGERY — COLONOSCOPY WITH PROPOFOL
Anesthesia: General

## 2021-08-07 ENCOUNTER — Telehealth: Payer: Self-pay

## 2021-08-07 DIAGNOSIS — K5100 Ulcerative (chronic) pancolitis without complications: Secondary | ICD-10-CM

## 2021-08-07 MED ORDER — RINVOQ 45 MG PO TB24: 1.0000 | ORAL_TABLET | Freq: Every day | ORAL | 1 refills | Status: DC

## 2021-08-07 MED ORDER — RINVOQ 15 MG PO TB24: 1.0000 | ORAL_TABLET | Freq: Every day | ORAL | 5 refills | Status: DC

## 2021-08-07 NOTE — Telephone Encounter (Signed)
-----   Message from Lin Landsman, MD sent at 08/07/2021 11:36 AM EDT ----- Regarding: labs on rinvoq Please have patient come for labs CBC and CMP 1 month after initiation of rinvoq  Thanks RV

## 2021-08-07 NOTE — Telephone Encounter (Signed)
Sent patient a mychart message informing patient and order CBC and CMP

## 2021-08-07 NOTE — Telephone Encounter (Signed)
Emailed Sharrie Rothman and informed her that she is going to start this medication and once she starts the medication she will discontinue the [facility]

## 2021-08-07 NOTE — Telephone Encounter (Signed)
We just received approval for rinvoq. [facility] and they state they need a new prescription because they have D/C the old one. Sent new prescription to them and they said they did a test claim and it was approved. They said they should contact patient in the next 7 days about shipment. Called patient and left a detail message and sent a mychart message

## 2021-08-14 ENCOUNTER — Other Ambulatory Visit: Payer: Self-pay

## 2021-08-14 ENCOUNTER — Other Ambulatory Visit: Payer: Self-pay | Admitting: Dermatology

## 2021-08-14 DIAGNOSIS — L820 Inflamed seborrheic keratosis: Secondary | ICD-10-CM

## 2021-08-14 NOTE — Progress Notes (Signed)
Had fax needing prior authorization called and got case #[id number] good from 08/07/2021-08/07/2022 Fully covered

## 2021-08-20 ENCOUNTER — Other Ambulatory Visit: Payer: Self-pay

## 2021-08-20 ENCOUNTER — Ambulatory Visit (INDEPENDENT_AMBULATORY_CARE_PROVIDER_SITE_OTHER): Payer: Managed Care, Other (non HMO) | Admitting: Dermatology

## 2021-08-20 DIAGNOSIS — L2081 Atopic neurodermatitis: Secondary | ICD-10-CM | POA: Diagnosis not present

## 2021-08-20 DIAGNOSIS — L820 Inflamed seborrheic keratosis: Secondary | ICD-10-CM

## 2021-08-20 NOTE — Patient Instructions (Addendum)
If you have any questions or concerns for your doctor, please call our main line at [phone] and press option 4 to reach your doctor's medical assistant. If no one answers, please leave a voicemail as directed and we will return your call as soon as possible. Messages left after 4 pm will be answered the following business day.   You may also send [place] a message via [place]. We typically respond to MyChart messages within 1-2 business days.  For prescription refills, please ask your pharmacy to contact our office. Our fax number is [phone].  If you have an urgent issue when the clinic is closed that cannot wait until the next business day, you can page your doctor at the number below.    Please note that while we do our best to be available for urgent issues outside of office hours, we are not available 24/7.   If you have an urgent issue and are unable to reach [place], you may choose to seek medical care at your doctor's office, retail clinic, urgent care center, or emergency room.  If you have a medical emergency, please immediately call 911 or go to the emergency department.  Pager Numbers  - Dr. Nehemiah Massed: [phone]  - Dr. Laurence Ferrari: [phone]  - Dr. Nicole Kindred: [phone]  In the event of inclement weather, please call our main line at [phone] for an update on the status of any delays or closures.  Dermatology Medication Tips: Please keep the boxes that topical medications come in in order to help keep track of the instructions about where and how to use these. Pharmacies typically print the medication instructions only on the boxes and not directly on the medication tubes.  Cryotherapy Aftercare  Wash gently with soap and water everyday.   Apply Vaseline and Band-Aid daily until healed.   If your medication is too expensive, please contact our office at [phone] option 4 or send [place] a message through [place].   We are unable to tell what your co-pay for medications  will be in advance as this is different depending on your insurance coverage. However, we may be able to find a substitute medication at lower cost or fill out paperwork to get insurance to cover a needed medication.   If a prior authorization is required to get your medication covered by your insurance company, please allow [place] 1-2 business days to complete this process.  Drug prices often vary depending on where the prescription is filled and some pharmacies may offer cheaper prices.  The website www.goodrx.com contains coupons for medications through different pharmacies. The prices here do not account for what the cost may be with help from insurance (it may be cheaper with your insurance), but the website can give you the price if you did not use any insurance.  - You can print the associated coupon and take it with your prescription to the pharmacy.  - You may also stop by our office during regular business hours and pick up a GoodRx coupon card.  - If you need your prescription sent electronically to a different pharmacy, notify our office through [facility] or by phone at [phone] option 4.

## 2021-08-20 NOTE — Progress Notes (Signed)
   Follow-Up Visit   Subjective  Natasha Mueller is a 55 y.o. female who presents for the following: Dermatitis (Arms, legs, trunk, started Rinvoq last week, d/c Dupixent 2 weeks ago not using any topicals clobetasol/cerave mix, elidel cr) and check spots (R leg, r arm, left arm, irritating).  Feels like Rinvoq is helping itch already.   The following portions of the chart were reviewed this encounter and updated as appropriate:       Review of Systems:  No other skin or systemic complaints except as noted in HPI or Assessment and Plan.  Objective  Well appearing patient in no apparent distress; mood and affect are within normal limits.  A focused examination was performed including trunk, arms. Relevant physical exam findings are noted in the Assessment and Plan.  arms, legs, trunk Pink scaly patches arms, legs  R lat ankle x 1, L wrist x 1, L shoudler x 1, R upper arm above elbow x 1, Total = 4 (4) Erythematous keratotic or waxy stuck-on papule   Assessment & Plan  Atopic neurodermatitis arms, legs, trunk  Gastroenterologist switched pt from [place] to Rinvoq will treat her atopic dermatitis and ulcerative colitis- just started this past week.  Last Dupixent shot was 2 weeks ago.  Atopic dermatitis - Severe, on Rinvoq  Atopic dermatitis (eczema) is a chronic, relapsing, pruritic condition that can significantly affect quality of life. It is often associated with allergic rhinitis and/or asthma and can require treatment with topical medications, phototherapy, or in severe cases biologic injectable medication (Dupixent; Adbry) or Oral JAK inhibitors.    Cont Rinvoq as prescribed by Dr. Marius Ditch, she will have labs done next month May cont Clobetasol/cerave cr qd/bid aa body prn flares May cont Elidel cr qd/bid aa eczema Recommend mild soap and moisturizing cream 1-2 times daily.   Topical steroids (such as triamcinolone, fluocinolone, fluocinonide, mometasone, clobetasol,  halobetasol, betamethasone, hydrocortisone) can cause thinning and lightening of the skin if they are used for too long in the same area. Your physician has selected the right strength medicine for your problem and area affected on the body. Please use your medication only as directed by your physician to prevent side effects.    Inflamed seborrheic keratosis R lat ankle x 1, L wrist x 1, L shoudler x 1, R upper arm above elbow x 1, Total = 4  Destruction of lesion - R lat ankle x 1, L wrist x 1, L shoudler x 1, R upper arm above elbow x 1, Total = 4  Destruction method: cryotherapy   Informed consent: discussed and consent obtained   Lesion destroyed using liquid nitrogen: Yes   Region frozen until ice ball extended beyond lesion: Yes   Outcome: patient tolerated procedure well with no complications   Post-procedure details: wound care instructions given   Additional details:  Prior to procedure, discussed risks of blister formation, small wound, skin dyspigmentation, or rare scar following cryotherapy. Recommend Vaseline ointment to treated areas while healing.   Related Medications clobetasol (TEMOVATE) 0.05 % external solution PATIENT TO MIX IN 1 JAR OF CERAVE CREAM AND APPLY ALL OVER TWICE DAILY UNTIL ITCH IMPROVED. AVOID FACE.  Return in about 6 months (around 02/18/2022) for Atopic Derm on Rinvoq.  I, Othelia Pulling, RMA, am acting as scribe for Brendolyn Patty, MD .  Documentation: I have reviewed the above documentation for accuracy and completeness, and I agree with the above.  Brendolyn Patty MD

## 2021-09-10 ENCOUNTER — Ambulatory Visit (INDEPENDENT_AMBULATORY_CARE_PROVIDER_SITE_OTHER): Payer: Managed Care, Other (non HMO) | Admitting: Nurse Practitioner

## 2021-09-10 ENCOUNTER — Other Ambulatory Visit: Payer: Self-pay

## 2021-09-10 ENCOUNTER — Encounter: Payer: Self-pay | Admitting: Nurse Practitioner

## 2021-09-10 VITALS — BP 116/75 | HR 66 | Temp 98.2°F | Ht 64.0 in | Wt 133.7 lb

## 2021-09-10 DIAGNOSIS — N951 Menopausal and female climacteric states: Secondary | ICD-10-CM

## 2021-09-10 DIAGNOSIS — Z7689 Persons encountering health services in other specified circumstances: Secondary | ICD-10-CM | POA: Diagnosis not present

## 2021-09-10 DIAGNOSIS — F419 Anxiety disorder, unspecified: Secondary | ICD-10-CM | POA: Insufficient documentation

## 2021-09-10 DIAGNOSIS — Z79890 Hormone replacement therapy (postmenopausal): Secondary | ICD-10-CM | POA: Diagnosis not present

## 2021-09-10 DIAGNOSIS — Z1231 Encounter for screening mammogram for malignant neoplasm of breast: Secondary | ICD-10-CM

## 2021-09-10 MED ORDER — ESTRADIOL 0.5 MG PO TABS: 0.5000 mg | ORAL_TABLET | Freq: Every day | ORAL | 1 refills | Status: DC

## 2021-09-10 MED ORDER — PROGESTERONE MICRONIZED 100 MG PO CAPS: 100.0000 mg | ORAL_CAPSULE | Freq: Every day | ORAL | 1 refills | Status: DC

## 2021-09-10 MED ORDER — SERTRALINE HCL 50 MG PO TABS: 75.0000 mg | ORAL_TABLET | Freq: Every day | ORAL | 1 refills | Status: DC

## 2021-09-10 NOTE — Progress Notes (Signed)
New Patient Office Visit  Subjective:  Patient ID: Natasha Mueller, female    DOB: 05/30/1966  Age: 55 y.o. MRN: [id number]  CC:  Chief Complaint  Patient presents with   New Patient (Initial Visit)    HPI Natasha Mueller presents to establish new primary care provider. The nurse practitioner she was seeing as her prior PCP has moved and patient was recommended this practice by her daughter. She has no current concerns or complaints. She does have history of ulcerative colitis. She sees GI specialist for this. Blood work is monitored frequently. She will be due for CPE in 12/2021. Last pap smear was 12/2019. It was normal. She is due to have screening mammogram . She uses [facility] in [place], [place]. She denies chest pain, chest pressure, or shortness of breath. She denies headaches or visual disturbances. she denies abdominal pain, nausea, vomiting, or changes in bowel or bladder habits.    Past Medical History:  Diagnosis Date   Allergy    BRCA negative 2013   with BART   Family history of breast cancer    BRCA/Bart neg 2013; IBIS=15%   Squamous cell carcinoma of skin 10/31/2009   Left superior clavicle/medial shoulder   UC (ulcerative colitis) ([place])     Past Surgical History:  Procedure Laterality Date   CESAREAN SECTION     COLONOSCOPY WITH PROPOFOL N/A 01/15/2019   Procedure: COLONOSCOPY WITH PROPOFOL;  Surgeon: Lucilla Lame, MD;  Location: [facility] ENDOSCOPY;  Service: Endoscopy;  Laterality: N/A;   COLONOSCOPY WITH PROPOFOL N/A 08/27/2019   Procedure: COLONOSCOPY WITH PROPOFOL;  Surgeon: Lin Landsman, MD;  Location: [facility] ENDOSCOPY;  Service: Gastroenterology;  Laterality: N/A;    Family History  Problem Relation Age of Onset   Breast cancer Mother 67   Hypertension Mother    Atrial fibrillation Father    Bladder Cancer Father 21   Diabetes Sister    Breast cancer Sister    Atrial fibrillation Brother     Social History   Socioeconomic History    Marital status: Divorced    Spouse name: Not on file   Number of children: Not on file   Years of education: Not on file   Highest education level: Not on file  Occupational History   Not on file  Tobacco Use   Smoking status: Never   Smokeless tobacco: Never  Vaping Use   Vaping Use: Never used  Substance and Sexual Activity   Alcohol use: No   Drug use: No   Sexual activity: Not Currently    Birth control/protection: Post-menopausal  Other Topics Concern   Not on file  Social History Narrative   Not on file   Social Determinants of Health   Financial Resource Strain: Not on file  Food Insecurity: Not on file  Transportation Needs: Not on file  Physical Activity: Not on file  Stress: Not on file  Social Connections: Not on file  Intimate Partner Violence: Not on file    ROS Review of Systems  Constitutional:  Negative for activity change, appetite change, chills, fatigue and fever.  HENT:  Negative for congestion, postnasal drip, rhinorrhea, sinus pressure, sinus pain, sneezing and sore throat.   Eyes: Negative.   Respiratory:  Negative for cough, chest tightness, shortness of breath and wheezing.   Cardiovascular:  Negative for chest pain and palpitations.  Gastrointestinal:  Negative for abdominal pain, constipation, diarrhea, nausea and vomiting.  Endocrine: Negative for cold intolerance, heat intolerance, polydipsia  and polyuria.  Genitourinary:  Negative for dyspareunia, dysuria, flank pain, frequency and urgency.  Musculoskeletal:  Negative for arthralgias, back pain and myalgias.  Skin:  Negative for rash.  Allergic/Immunologic: Negative for environmental allergies.  Neurological:  Negative for dizziness, weakness and headaches.  Hematological:  Negative for adenopathy.  Psychiatric/Behavioral:  Positive for dysphoric mood. The patient is nervous/anxious.        The patient's symptoms are well managed with current dose sertraline    Objective:   Today's  Vitals   09/10/21 1135  BP: 116/75  Pulse: 66  Temp: 98.2 F (36.8 C)  SpO2: 100%  Weight: 133 lb 11.2 oz (60.6 kg)  Height: 5' 4"  (1.626 m)   Body mass index is 22.95 kg/m.   Physical Exam Vitals and nursing note reviewed.  Constitutional:      Appearance: Normal appearance. She is well-developed.  HENT:     Head: Normocephalic and atraumatic.     Nose: Nose normal.     Mouth/Throat:     Mouth: Mucous membranes are moist.  Eyes:     Extraocular Movements: Extraocular movements intact.     Conjunctiva/sclera: Conjunctivae normal.     Pupils: Pupils are equal, round, and reactive to light.  Cardiovascular:     Rate and Rhythm: Normal rate and regular rhythm.     Pulses: Normal pulses.     Heart sounds: Normal heart sounds.  Pulmonary:     Effort: Pulmonary effort is normal.     Breath sounds: Normal breath sounds.  Abdominal:     Palpations: Abdomen is soft.  Musculoskeletal:        General: Normal range of motion.     Cervical back: Normal range of motion and neck supple.  Lymphadenopathy:     Cervical: No cervical adenopathy.  Skin:    General: Skin is warm and dry.     Capillary Refill: Capillary refill takes less than 2 seconds.  Neurological:     General: No focal deficit present.     Mental Status: She is alert and oriented to person, place, and time.  Psychiatric:        Mood and Affect: Mood normal.        Behavior: Behavior normal.        Thought Content: Thought content normal.        Judgment: Judgment normal.    Assessment & Plan:  1. Encounter to establish care Appointment today to establish new primary care provider    2. Hormone replacement therapy Patient doing well with current medications. Refills sent to her pharmacy today.  - estradiol (ESTRACE) 0.5 MG tablet; Take 1 tablet (0.5 mg total) by mouth daily.  Dispense: 90 tablet; Refill: 1 - progesterone (PROMETRIUM) 100 MG capsule; Take 1 capsule (100 mg total) by mouth daily.  Dispense: 90  capsule; Refill: 1  3. Vasomotor symptoms due to menopause Patient doing well with current medications. Refills sent to her pharmacy today.  - estradiol (ESTRACE) 0.5 MG tablet; Take 1 tablet (0.5 mg total) by mouth daily.  Dispense: 90 tablet; Refill: 1 - progesterone (PROMETRIUM) 100 MG capsule; Take 1 capsule (100 mg total) by mouth daily.  Dispense: 90 capsule; Refill: 1  4. Anxiety Stable on current dose of sertraline. Continue as prescribed. New prescription sent to her pharmacy today . - sertraline (ZOLOFT) 50 MG tablet; Take 1.5 tablets (75 mg total) by mouth daily. Please schedule office visit before any future refills.  Dispense: 90 tablet; Refill:  1  5. Encounter for screening mammogram for malignant neoplasm of breast Screening mammogram order placed today - MM DIGITAL SCREENING BILATERAL; Future    Problem List Items Addressed This Visit       Other   Hormone replacement therapy   Relevant Medications   estradiol (ESTRACE) 0.5 MG tablet   progesterone (PROMETRIUM) 100 MG capsule   Vasomotor symptoms due to menopause   Relevant Medications   estradiol (ESTRACE) 0.5 MG tablet   progesterone (PROMETRIUM) 100 MG capsule   Anxiety   Relevant Medications   sertraline (ZOLOFT) 50 MG tablet   Other Visit Diagnoses     Encounter to establish care    -  Primary   Encounter for screening mammogram for malignant neoplasm of breast       Relevant Orders   MM DIGITAL SCREENING BILATERAL       Outpatient Encounter Medications as of 09/10/2021  Medication Sig   clobetasol (TEMOVATE) 0.05 % external solution PATIENT TO MIX IN 1 JAR OF CERAVE CREAM AND APPLY ALL OVER TWICE DAILY UNTIL ITCH IMPROVED. AVOID FACE.   pimecrolimus (ELIDEL) 1 % cream Apply to affected areas rash 1-2 times daily until improved.   Upadacitinib ER (RINVOQ) 15 MG TB24 Take 1 tablet by mouth daily.   Upadacitinib ER (RINVOQ) 45 MG TB24 Take 1 tablet by mouth daily.   [DISCONTINUED] estradiol (ESTRACE)  0.5 MG tablet Take 1 tablet (0.5 mg total) by mouth daily.   [DISCONTINUED] progesterone (PROMETRIUM) 100 MG capsule Take 1 capsule (100 mg total) by mouth daily.   Dupilumab (DUPIXENT) 300 MG/2ML SOPN Inject 300 mg into the skin every 14 (fourteen) days. Starting at day 15 for maintenance.   estradiol (ESTRACE) 0.5 MG tablet Take 1 tablet (0.5 mg total) by mouth daily.   progesterone (PROMETRIUM) 100 MG capsule Take 1 capsule (100 mg total) by mouth daily.   sertraline (ZOLOFT) 50 MG tablet Take 1.5 tablets (75 mg total) by mouth daily. Please schedule office visit before any future refills.   [DISCONTINUED] sertraline (ZOLOFT) 50 MG tablet Take 1.5 tablets (75 mg total) by mouth daily. Please schedule office visit before any future refills.   No facility-administered encounter medications on file as of 09/10/2021.    Follow-up: Return in about 4 months (around 01/08/2022) for health maintenance exam, FBW at time of visit - please add free T4 and vitamin d to labs. TY..   Ronnell Freshwater, NP

## 2021-09-18 LAB — COMPREHENSIVE METABOLIC PANEL
ALT: 12 IU/L (ref 0–32)
AST: 21 IU/L (ref 0–40)
Albumin/Globulin Ratio: 2 (ref 1.2–2.2)
Albumin: 4.9 g/dL (ref 3.8–4.9)
Alkaline Phosphatase: 57 IU/L (ref 44–121)
BUN/Creatinine Ratio: 22 (ref 9–23)
BUN: 19 mg/dL (ref 6–24)
Bilirubin Total: <0.2 mg/dL (ref 0.0–1.2)
CO2: 20 mmol/L (ref 20–29)
Calcium: 9.4 mg/dL (ref 8.7–10.2)
Chloride: 102 mmol/L (ref 96–106)
Creatinine, Ser: 0.86 mg/dL (ref 0.57–1.00)
Globulin, Total: 2.5 g/dL (ref 1.5–4.5)
Glucose: 102 mg/dL — ABNORMAL HIGH (ref 70–99)
Potassium: 4.3 mmol/L (ref 3.5–5.2)
Sodium: 137 mmol/L (ref 134–144)
Total Protein: 7.4 g/dL (ref 6.0–8.5)
eGFR: 79 mL/min/{1.73_m2} (ref >59)

## 2021-09-18 LAB — CBC
Hematocrit: 35.2 % (ref 34.0–46.6)
Hemoglobin: 12.2 g/dL (ref 11.1–15.9)
MCH: 30 pg (ref 26.6–33.0)
MCHC: 34.7 g/dL (ref 31.5–35.7)
MCV: 87 fL (ref 79–97)
Platelets: 205 10*3/uL (ref 150–450)
RBC: 4.07 x10E6/uL (ref 3.77–5.28)
RDW: 12.3 % (ref 11.7–15.4)
WBC: 2.6 10*3/uL — ABNORMAL LOW (ref 3.4–10.8)

## 2021-09-25 ENCOUNTER — Other Ambulatory Visit: Payer: Self-pay | Admitting: Gastroenterology

## 2021-09-25 DIAGNOSIS — K5100 Ulcerative (chronic) pancolitis without complications: Secondary | ICD-10-CM

## 2021-10-01 ENCOUNTER — Ambulatory Visit: Payer: Managed Care, Other (non HMO) | Admitting: Gastroenterology

## 2021-10-01 ENCOUNTER — Other Ambulatory Visit: Payer: Self-pay

## 2021-10-01 ENCOUNTER — Other Ambulatory Visit: Payer: Self-pay | Admitting: Gastroenterology

## 2021-10-01 DIAGNOSIS — K5100 Ulcerative (chronic) pancolitis without complications: Secondary | ICD-10-CM

## 2021-10-04 ENCOUNTER — Encounter: Payer: Self-pay | Admitting: Gastroenterology

## 2021-10-04 LAB — CBC
Hematocrit: 35.4 % (ref 34.0–46.6)
Hemoglobin: 11.9 g/dL (ref 11.1–15.9)
MCH: 29.6 pg (ref 26.6–33.0)
MCHC: 33.6 g/dL (ref 31.5–35.7)
MCV: 88 fL (ref 79–97)
Platelets: 289 10*3/uL (ref 150–450)
RBC: 4.02 x10E6/uL (ref 3.77–5.28)
RDW: 12.7 % (ref 11.7–15.4)
WBC: 3.1 10*3/uL — ABNORMAL LOW (ref 3.4–10.8)

## 2021-10-04 LAB — LIPID PANEL
Chol/HDL Ratio: 4.1 ratio (ref 0.0–4.4)
Cholesterol, Total: 312 mg/dL — ABNORMAL HIGH (ref 100–199)
HDL: 77 mg/dL (ref >39)
LDL Chol Calc (NIH): 218 mg/dL — ABNORMAL HIGH (ref 0–99)
Triglycerides: 101 mg/dL (ref 0–149)
VLDL Cholesterol Cal: 17 mg/dL (ref 5–40)

## 2021-10-04 LAB — COMPREHENSIVE METABOLIC PANEL
ALT: 12 IU/L (ref 0–32)
AST: 20 IU/L (ref 0–40)
Albumin/Globulin Ratio: 1.8 (ref 1.2–2.2)
Albumin: 4.9 g/dL (ref 3.8–4.9)
Alkaline Phosphatase: 49 IU/L (ref 44–121)
BUN/Creatinine Ratio: 14 (ref 9–23)
BUN: 12 mg/dL (ref 6–24)
Bilirubin Total: 0.3 mg/dL (ref 0.0–1.2)
CO2: 24 mmol/L (ref 20–29)
Calcium: 9.1 mg/dL (ref 8.7–10.2)
Chloride: 102 mmol/L (ref 96–106)
Creatinine, Ser: 0.86 mg/dL (ref 0.57–1.00)
Globulin, Total: 2.7 g/dL (ref 1.5–4.5)
Glucose: 89 mg/dL (ref 70–99)
Potassium: 4.5 mmol/L (ref 3.5–5.2)
Sodium: 138 mmol/L (ref 134–144)
Total Protein: 7.6 g/dL (ref 6.0–8.5)
eGFR: 79 mL/min/{1.73_m2} (ref >59)

## 2021-10-14 ENCOUNTER — Other Ambulatory Visit: Payer: Self-pay | Admitting: Dermatology

## 2021-10-14 DIAGNOSIS — L820 Inflamed seborrheic keratosis: Secondary | ICD-10-CM

## 2021-11-19 ENCOUNTER — Telehealth: Payer: Self-pay

## 2021-11-19 DIAGNOSIS — K5100 Ulcerative (chronic) pancolitis without complications: Secondary | ICD-10-CM

## 2021-11-19 MED ORDER — RINVOQ 30 MG PO TB24: 1.0000 | ORAL_TABLET | Freq: Every day | ORAL | 0 refills | Status: DC

## 2021-11-19 NOTE — Telephone Encounter (Signed)
-----   Message from Lin Landsman, MD sent at 11/19/2021 10:19 AM EST ----- Regarding: Medication Natasha Mueller  Please update her Rinvoq to 30 mg maintenance dose for 1 month Lets recheck CBC, LFTs and fasting lipids ASAP  I sent her message via MyChart  RV

## 2021-11-19 NOTE — Telephone Encounter (Signed)
Sent medication to the pharmacy and order the labs

## 2021-11-26 ENCOUNTER — Telehealth: Payer: Self-pay

## 2021-11-26 NOTE — Telephone Encounter (Signed)
Did PA on Rinvoq 42m on cover my meds. Waiting on response from insurance company

## 2021-11-30 LAB — CBC
Hematocrit: 34.8 % (ref 34.0–46.6)
Hemoglobin: 11.6 g/dL (ref 11.1–15.9)
MCH: 30.6 pg (ref 26.6–33.0)
MCHC: 33.3 g/dL (ref 31.5–35.7)
MCV: 92 fL (ref 79–97)
Platelets: 420 10*3/uL (ref 150–450)
RBC: 3.79 x10E6/uL (ref 3.77–5.28)
RDW: 14.8 % (ref 11.7–15.4)
WBC: 4.9 10*3/uL (ref 3.4–10.8)

## 2021-11-30 LAB — HEPATIC FUNCTION PANEL
ALT: 12 IU/L (ref 0–32)
AST: 19 IU/L (ref 0–40)
Albumin: 4.8 g/dL (ref 3.8–4.9)
Alkaline Phosphatase: 73 IU/L (ref 44–121)
Bilirubin Total: <0.2 mg/dL (ref 0.0–1.2)
Bilirubin, Direct: <0.1 mg/dL (ref 0.00–0.40)
Total Protein: 7.4 g/dL (ref 6.0–8.5)

## 2021-11-30 LAB — LIPID PANEL
Chol/HDL Ratio: 3.9 ratio (ref 0.0–4.4)
Cholesterol, Total: 307 mg/dL — ABNORMAL HIGH (ref 100–199)
HDL: 78 mg/dL (ref >39)
LDL Chol Calc (NIH): 186 mg/dL — ABNORMAL HIGH (ref 0–99)
Triglycerides: 230 mg/dL — ABNORMAL HIGH (ref 0–149)
VLDL Cholesterol Cal: 43 mg/dL — ABNORMAL HIGH (ref 5–40)

## 2021-12-14 ENCOUNTER — Other Ambulatory Visit: Payer: Self-pay | Admitting: Gastroenterology

## 2022-01-03 ENCOUNTER — Encounter: Payer: Self-pay | Admitting: Nurse Practitioner

## 2022-01-03 NOTE — Telephone Encounter (Signed)
Hey. I didn't know this was still happening. I'm not sure how I can fix this.

## 2022-01-09 ENCOUNTER — Encounter: Payer: Managed Care, Other (non HMO) | Admitting: Nurse Practitioner

## 2022-01-10 ENCOUNTER — Other Ambulatory Visit: Payer: Self-pay | Admitting: Nurse Practitioner

## 2022-01-16 ENCOUNTER — Other Ambulatory Visit: Payer: Self-pay

## 2022-01-16 DIAGNOSIS — Z79890 Hormone replacement therapy (postmenopausal): Secondary | ICD-10-CM

## 2022-01-16 DIAGNOSIS — L820 Inflamed seborrheic keratosis: Secondary | ICD-10-CM

## 2022-01-16 DIAGNOSIS — F419 Anxiety disorder, unspecified: Secondary | ICD-10-CM

## 2022-01-16 DIAGNOSIS — N951 Menopausal and female climacteric states: Secondary | ICD-10-CM

## 2022-01-16 MED ORDER — ESTRADIOL 0.5 MG PO TABS: 0.5000 mg | ORAL_TABLET | Freq: Every day | ORAL | 1 refills | Status: DC

## 2022-01-16 MED ORDER — PROGESTERONE MICRONIZED 100 MG PO CAPS: 100.0000 mg | ORAL_CAPSULE | Freq: Every day | ORAL | 1 refills | Status: DC

## 2022-01-16 MED ORDER — CLOBETASOL PROPIONATE 0.05 % EX SOLN: CUTANEOUS | 0 refills | Status: DC

## 2022-01-16 MED ORDER — SERTRALINE HCL 50 MG PO TABS: 50.0000 mg | ORAL_TABLET | Freq: Every day | ORAL | 1 refills | Status: DC

## 2022-01-16 NOTE — Progress Notes (Signed)
Clobetasol RF request to [facility] per fax. aw ?

## 2022-03-02 ENCOUNTER — Other Ambulatory Visit: Payer: Self-pay | Admitting: Dermatology

## 2022-03-02 DIAGNOSIS — L820 Inflamed seborrheic keratosis: Secondary | ICD-10-CM

## 2022-03-04 ENCOUNTER — Ambulatory Visit: Payer: Managed Care, Other (non HMO) | Admitting: Dermatology

## 2022-03-08 ENCOUNTER — Other Ambulatory Visit: Payer: Self-pay | Admitting: Nurse Practitioner

## 2022-03-08 DIAGNOSIS — Z79890 Hormone replacement therapy (postmenopausal): Secondary | ICD-10-CM

## 2022-03-08 DIAGNOSIS — N951 Menopausal and female climacteric states: Secondary | ICD-10-CM

## 2022-05-26 ENCOUNTER — Other Ambulatory Visit: Payer: Self-pay | Admitting: Gastroenterology

## 2022-05-27 ENCOUNTER — Other Ambulatory Visit: Payer: Self-pay

## 2022-05-27 MED ORDER — RINVOQ 30 MG PO TB24: 1.0000 | ORAL_TABLET | Freq: Every day | ORAL | 0 refills | Status: DC

## 2022-06-23 ENCOUNTER — Other Ambulatory Visit: Payer: Self-pay | Admitting: Gastroenterology

## 2022-07-24 ENCOUNTER — Other Ambulatory Visit: Payer: Self-pay | Admitting: Gastroenterology

## 2022-07-24 MED ORDER — RINVOQ 30 MG PO TB24: 1.0000 | ORAL_TABLET | Freq: Every day | ORAL | 2 refills | Status: DC

## 2022-07-26 ENCOUNTER — Telehealth: Payer: Self-pay

## 2022-07-26 NOTE — Telephone Encounter (Signed)
Submitted PA through cover my meds for the Rinvoq medication and it has been approved by patient insurance

## 2022-08-06 DIAGNOSIS — Z1231 Encounter for screening mammogram for malignant neoplasm of breast: Secondary | ICD-10-CM

## 2022-09-30 ENCOUNTER — Other Ambulatory Visit: Payer: Self-pay

## 2022-09-30 MED ORDER — RINVOQ 30 MG PO TB24: 1.0000 | ORAL_TABLET | Freq: Every day | ORAL | 0 refills | Status: DC

## 2022-09-30 NOTE — Telephone Encounter (Signed)
Last office visit 06/25/2022 Ulcerative Pancolitis  Last refill 07/24/2022 2 refills  Has appointment 11/27/2022

## 2022-10-01 ENCOUNTER — Other Ambulatory Visit: Payer: Self-pay | Admitting: Nurse Practitioner

## 2022-10-01 DIAGNOSIS — F419 Anxiety disorder, unspecified: Secondary | ICD-10-CM

## 2022-10-01 DIAGNOSIS — N951 Menopausal and female climacteric states: Secondary | ICD-10-CM

## 2022-10-01 DIAGNOSIS — Z79890 Hormone replacement therapy (postmenopausal): Secondary | ICD-10-CM

## 2022-10-24 ENCOUNTER — Ambulatory Visit
Admission: RE | Admit: 2022-10-24 | Discharge: 2022-10-24 | Disposition: A | Discharge status: Home / Self Care | Payer: Managed Care, Other (non HMO) | Source: Ambulatory Visit

## 2022-10-24 ENCOUNTER — Ambulatory Visit
Admission: RE | Admit: 2022-10-24 | Discharge: 2022-10-24 | Disposition: A | Discharge status: Home / Self Care | Payer: Managed Care, Other (non HMO) | Source: Ambulatory Visit | Attending: Nurse Practitioner | Admitting: Nurse Practitioner

## 2022-10-24 DIAGNOSIS — Z1231 Encounter for screening mammogram for malignant neoplasm of breast: Secondary | ICD-10-CM

## 2022-10-29 ENCOUNTER — Encounter: Payer: Self-pay | Admitting: Nurse Practitioner

## 2022-10-30 ENCOUNTER — Other Ambulatory Visit: Payer: Self-pay | Admitting: Nurse Practitioner

## 2022-10-30 DIAGNOSIS — N951 Menopausal and female climacteric states: Secondary | ICD-10-CM

## 2022-10-30 DIAGNOSIS — Z79890 Hormone replacement therapy (postmenopausal): Secondary | ICD-10-CM

## 2022-10-31 ENCOUNTER — Encounter: Payer: Self-pay | Admitting: Nurse Practitioner

## 2022-10-31 ENCOUNTER — Other Ambulatory Visit: Payer: Self-pay

## 2022-10-31 ENCOUNTER — Ambulatory Visit (INDEPENDENT_AMBULATORY_CARE_PROVIDER_SITE_OTHER): Payer: Managed Care, Other (non HMO) | Admitting: Nurse Practitioner

## 2022-10-31 VITALS — BP 128/80 | HR 60 | Ht 64.0 in | Wt 135.4 lb

## 2022-10-31 DIAGNOSIS — Z79890 Hormone replacement therapy (postmenopausal): Secondary | ICD-10-CM | POA: Diagnosis not present

## 2022-10-31 DIAGNOSIS — E559 Vitamin D deficiency, unspecified: Secondary | ICD-10-CM

## 2022-10-31 DIAGNOSIS — F419 Anxiety disorder, unspecified: Secondary | ICD-10-CM | POA: Diagnosis not present

## 2022-10-31 DIAGNOSIS — N951 Menopausal and female climacteric states: Secondary | ICD-10-CM

## 2022-10-31 DIAGNOSIS — R5383 Other fatigue: Secondary | ICD-10-CM

## 2022-10-31 DIAGNOSIS — Z130 Encounter for screening for diseases of the blood and blood-forming organs and certain disorders involving the immune mechanism: Secondary | ICD-10-CM

## 2022-10-31 DIAGNOSIS — Z13228 Encounter for screening for other metabolic disorders: Secondary | ICD-10-CM

## 2022-10-31 DIAGNOSIS — Z0000 Encounter for general adult medical examination without abnormal findings: Secondary | ICD-10-CM

## 2022-10-31 DIAGNOSIS — Z1321 Encounter for screening for nutritional disorder: Secondary | ICD-10-CM

## 2022-10-31 DIAGNOSIS — Z1329 Encounter for screening for other suspected endocrine disorder: Secondary | ICD-10-CM

## 2022-10-31 DIAGNOSIS — Z0001 Encounter for general adult medical examination with abnormal findings: Secondary | ICD-10-CM

## 2022-10-31 DIAGNOSIS — K5100 Ulcerative (chronic) pancolitis without complications: Secondary | ICD-10-CM

## 2022-10-31 DIAGNOSIS — E569 Vitamin deficiency, unspecified: Secondary | ICD-10-CM

## 2022-10-31 MED ORDER — SERTRALINE HCL 50 MG PO TABS: 75.0000 mg | ORAL_TABLET | Freq: Every day | ORAL | 1 refills | Status: DC

## 2022-10-31 MED ORDER — PROGESTERONE MICRONIZED 100 MG PO CAPS: 100.0000 mg | ORAL_CAPSULE | Freq: Every day | ORAL | 1 refills | Status: DC

## 2022-10-31 MED ORDER — ESTRADIOL 0.5 MG PO TABS: 0.5000 mg | ORAL_TABLET | Freq: Every day | ORAL | 1 refills | Status: DC

## 2022-10-31 NOTE — Progress Notes (Signed)
Complete physical exam   Patient: Natasha Mueller   DOB: [birth year]   57 y.o. Female  MRN: [id number] Visit Date: 10/31/2022    Chief Complaint  Patient presents with   Annual Exam   Subjective    Natasha Mueller is a 57 y.o. female who presents today for a complete physical exam.  She reports consuming a  generally healthy  diet.  She states that she exercises at home intermittently  She generally feels fairly well. She does not have additional problems to discuss today.   HPI  Annual physcal -history of ulcerative colitis  -due for colonoscopy. Has appointment at the end of this month to see GI.  -routine, fasting labs drawn this morning  -She denies chest pain, chest pressure, or shortness of breath. She denies headaches or visual disturbances.  She denies abdominal pain, nausea, vomiting, or changes in bowel or bladder habits.    Past Medical History:  Diagnosis Date   Allergy    BRCA negative 2013   with BART   Family history of breast cancer    BRCA/Bart neg 2013; IBIS=15%   Squamous cell carcinoma of skin 10/31/2009   Left superior clavicle/medial shoulder   UC (ulcerative colitis) ([place])    Past Surgical History:  Procedure Laterality Date   CESAREAN SECTION     COLONOSCOPY WITH PROPOFOL N/A 01/15/2019   Procedure: COLONOSCOPY WITH PROPOFOL;  Surgeon: Lucilla Lame, MD;  Location: [facility] ENDOSCOPY;  Service: Endoscopy;  Laterality: N/A;   COLONOSCOPY WITH PROPOFOL N/A 08/27/2019   Procedure: COLONOSCOPY WITH PROPOFOL;  Surgeon: Lin Landsman, MD;  Location: [facility] ENDOSCOPY;  Service: Gastroenterology;  Laterality: N/A;   Social History   Socioeconomic History   Marital status: Divorced    Spouse name: Not on file   Number of children: Not on file   Years of education: Not on file   Highest education level: Not on file  Occupational History   Not on file  Tobacco Use   Smoking status: Never   Smokeless tobacco: Never  Vaping Use   Vaping Use: Never used   Substance and Sexual Activity   Alcohol use: No   Drug use: No   Sexual activity: Not Currently    Birth control/protection: Post-menopausal  Other Topics Concern   Not on file  Social History Narrative   Not on file   Social Determinants of Health   Financial Resource Strain: Not on file  Food Insecurity: Not on file  Transportation Needs: Not on file  Physical Activity: Not on file  Stress: Not on file  Social Connections: Not on file  Intimate Partner Violence: Not on file   Family Status  Relation Name Status   Mother  Alive   Father  Alive   Sister  Alive   Brother  Alive   Sister  Alive   Family History  Problem Relation Age of Onset   Breast cancer Mother 67   Hypertension Mother    Atrial fibrillation Father    Bladder Cancer Father 27   Diabetes Sister    Breast cancer Sister    Atrial fibrillation Brother    Allergies  Allergen Reactions   Hyoscyamine Sulfate Swelling    Tongue swelling    Patient Care Team: Ronnell Freshwater, NP as PCP - General (Family Medicine)   Medications: Outpatient Medications Prior to Visit  Medication Sig   clobetasol (TEMOVATE) 0.05 % external solution PATIENT TO MIX IN 1 JAR OF CERAVE CREAM AND  APPLY ALL OVER TWICE DAILY UNTIL ITCH IMPROVED. AVOID FACE.   Upadacitinib ER (RINVOQ) 30 MG TB24 Take 1 tablet by mouth daily.   [DISCONTINUED] estradiol (ESTRACE) 0.5 MG tablet TAKE 1 TABLET BY MOUTH EVERY DAY   [DISCONTINUED] progesterone (PROMETRIUM) 100 MG capsule TAKE 1 CAPSULE BY MOUTH EVERY DAY   [DISCONTINUED] sertraline (ZOLOFT) 50 MG tablet TAKE 1.5 TABLETS (75 MG TOTAL) BY MOUTH DAILY. PLEASE SCHEDULE OFFICE VISIT BEFORE ANY FUTURE REFILLS.   [DISCONTINUED] pimecrolimus (ELIDEL) 1 % cream Apply to affected areas rash 1-2 times daily until improved.   [DISCONTINUED] Upadacitinib ER (RINVOQ) 15 MG TB24 Take 1 tablet by mouth daily.   [DISCONTINUED] Upadacitinib ER (RINVOQ) 45 MG TB24 Take 1 tablet by mouth daily.   No  facility-administered medications prior to visit.    Review of Systems  Constitutional:  Negative for activity change, appetite change, chills, fatigue and fever.  HENT:  Negative for congestion, postnasal drip, rhinorrhea, sinus pressure, sinus pain, sneezing and sore throat.   Eyes: Negative.   Respiratory:  Negative for cough, chest tightness, shortness of breath and wheezing.   Cardiovascular:  Negative for chest pain and palpitations.  Gastrointestinal:  Negative for abdominal pain, constipation, diarrhea, nausea and vomiting.  Endocrine: Negative for cold intolerance, heat intolerance, polydipsia and polyuria.  Genitourinary:  Negative for dyspareunia, dysuria, flank pain, frequency and urgency.  Musculoskeletal:  Negative for arthralgias, back pain and myalgias.  Skin:  Negative for rash.  Allergic/Immunologic: Negative for environmental allergies.  Neurological:  Negative for dizziness, weakness and headaches.  Hematological:  Negative for adenopathy.  Psychiatric/Behavioral:  Positive for dysphoric mood. The patient is not nervous/anxious.        Well managed on current dose sertraline        Objective     Today's Vitals   10/31/22 1041  BP: 128/80  Pulse: 60  SpO2: 99%  Weight: 135 lb 6.4 oz (61.4 kg)  Height: _0  (1.626 m)   Body mass index is 23.24 kg/m.  BP Readings from Last 3 Encounters:  10/31/22 128/80  09/10/21 116/75  06/25/21 133/82    Wt Readings from Last 3 Encounters:  10/31/22 135 lb 6.4 oz (61.4 kg)  09/10/21 133 lb 11.2 oz (60.6 kg)  06/25/21 132 lb 4 oz (60 kg)     Physical Exam Vitals and nursing note reviewed.  Constitutional:      Appearance: Normal appearance. She is well-developed.  HENT:     Head: Normocephalic and atraumatic.     Right Ear: Tympanic membrane, ear canal and external ear normal.     Left Ear: Tympanic membrane, ear canal and external ear normal.     Nose: Nose normal.     Mouth/Throat:     Mouth: Mucous  membranes are moist.     Pharynx: Oropharynx is clear.  Eyes:     Extraocular Movements: Extraocular movements intact.     Conjunctiva/sclera: Conjunctivae normal.     Pupils: Pupils are equal, round, and reactive to light.  Cardiovascular:     Rate and Rhythm: Normal rate and regular rhythm.     Pulses: Normal pulses.     Heart sounds: Normal heart sounds.  Pulmonary:     Effort: Pulmonary effort is normal.     Breath sounds: Normal breath sounds.  Chest:    Abdominal:     General: Bowel sounds are normal. There is no distension.     Palpations: Abdomen is soft. There is no mass.  Tenderness: There is no abdominal tenderness. There is no right CVA tenderness, left CVA tenderness, guarding or rebound.     Hernia: No hernia is present.  Musculoskeletal:        General: Normal range of motion.     Cervical back: Normal range of motion and neck supple.  Lymphadenopathy:     Cervical: No cervical adenopathy.  Skin:    General: Skin is warm and dry.     Capillary Refill: Capillary refill takes less than 2 seconds.  Neurological:     General: No focal deficit present.     Mental Status: She is alert and oriented to person, place, and time.  Psychiatric:        Mood and Affect: Mood normal.        Behavior: Behavior normal.        Thought Content: Thought content normal.        Judgment: Judgment normal.     Last depression screening scores   Row Labels 10/31/2022   10:44 AM 09/10/2021   11:37 AM 12/21/2019    2:26 PM  PHQ 2/9 Scores   Section Header. No data exists in this row.     PHQ - 2 Score   0 0 2  PHQ- 9 Score   0 3 8   Last fall risk screening   Row Labels 09/10/2021   11:37 AM  [place]. No data exists in this row.   Falls in the past year?   0  Number falls in past yr:   0  Injury with Fall?   0  Follow up   Falls evaluation completed   Last Audit-C alcohol use screening   Row Labels 12/21/2019    2:25 PM  Alcohol Use Disorder Test  (AUDIT)   Section Header. No data exists in this row.   1. How often do you have a drink containing alcohol?   1  2. How many drinks containing alcohol do you have on a typical day when you are drinking?   0  3. How often do you have six or more drinks on one occasion?   0  AUDIT-C Score   1  Alcohol Brief Interventions/Follow-up   AUDIT Score <7 follow-up not indicated   A score of 3 or more in women, and 4 or more in men indicates increased risk for alcohol abuse, EXCEPT if all of the points are from question 1    Assessment & Plan    1. Encounter for general adult medical examination with abnormal findings Annual physical today   2. Hormone replacement therapy Continue estradiol and progesterone as previously prescribed. Refills sent for both today  - estradiol (ESTRACE) 0.5 MG tablet; Take 1 tablet (0.5 mg total) by mouth daily.  Dispense: 90 tablet; Refill: 1 - progesterone (PROMETRIUM) 100 MG capsule; Take 1 capsule (100 mg total) by mouth daily.  Dispense: 90 capsule; Refill: 1  3. Vasomotor symptoms due to menopause Continue estradiol and progesterone as previously prescribed. Refills sent for both today  - estradiol (ESTRACE) 0.5 MG tablet; Take 1 tablet (0.5 mg total) by mouth daily.  Dispense: 90 tablet; Refill: 1 - progesterone (PROMETRIUM) 100 MG capsule; Take 1 capsule (100 mg total) by mouth daily.  Dispense: 90 capsule; Refill: 1  4. Anxiety Stable. Continue sertraline 75 mg daily  - sertraline (ZOLOFT) 50 MG tablet; Take 1.5 tablets (75 mg total) by mouth daily.  Dispense: 135 tablet; Refill:  1  5. Fatigue, unspecified type Check thyroid panel  - TSH + free T4  6. Vitamin D deficiency Check vitamin d level and treat deficiency as indicated.   - Vitamin D (25 hydroxy)  7. Ulcerative pancolitis without complication ([place]) Keep scheduled appointment with GI later this month   8. Health care maintenance Routine, fasting labs drawn during today's visit.  - Comp Met  (CMET) - CBC - Hemoglobin A1c - Lipid panel - TSH + free T4  9. Screening for endocrine, nutritional, metabolic and immunity disorder Routine, fasting labs drawn during today's visit.  - Comp Met (CMET) - CBC - Hemoglobin A1c - Lipid panel - TSH + free T4    Immunization History  Administered Date(s) Administered   Hepatitis A, Adult 04/14/2019, 10/14/2019   Hepatitis B, adult 04/14/2019, 05/12/2019   PPD Test 07/11/2015   Pneumococcal Conjugate-13 04/14/2019   Pneumococcal Polysaccharide-23 06/09/2019   Tdap 10/19/2018   Zoster Recombinat (Shingrix) 04/14/2019, 06/09/2019    Health Maintenance  Topic Date Due   COVID-19 Vaccine (1) Never done   COLONOSCOPY (Pts 45-6yr Insurance coverage will need to be confirmed)  08/26/2022   INFLUENZA VACCINE  01/26/2023 (Originally 05/28/2022)   PAP SMEAR-Modifier  01/12/2023   MAMMOGRAM  10/24/2024   DTaP/Tdap/Td (2 - Td or Tdap) 10/19/2028   Hepatitis C Screening  Completed   HIV Screening  Completed   Zoster Vaccines- Shingrix  Completed   HPV VACCINES  Aged Out    Discussed health benefits of physical activity, and encouraged her to engage in regular exercise appropriate for her age and condition.  Problem List Items Addressed This Visit       Digestive   Ulcerative pancolitis without complication (HCC)     Other   Hormone replacement therapy   Relevant Medications   estradiol (ESTRACE) 0.5 MG tablet   progesterone (PROMETRIUM) 100 MG capsule   Vasomotor symptoms due to menopause   Relevant Medications   estradiol (ESTRACE) 0.5 MG tablet   progesterone (PROMETRIUM) 100 MG capsule   Anxiety   Relevant Medications   sertraline (ZOLOFT) 50 MG tablet   Other Visit Diagnoses     Encounter for general adult medical examination with abnormal findings    -  Primary   Fatigue, unspecified type       Vitamin D deficiency       Health care maintenance       Screening for endocrine, nutritional, metabolic and immunity  disorder            Return in about 6 months (around 05/01/2023) for mood, med refills.        HRonnell Freshwater NP  [phone](phone) [phone](fax)  [place]

## 2022-11-01 LAB — COMPREHENSIVE METABOLIC PANEL
ALT: 17 IU/L (ref 0–32)
AST: 22 IU/L (ref 0–40)
Albumin/Globulin Ratio: 1.6 (ref 1.2–2.2)
Albumin: 4.6 g/dL (ref 3.8–4.9)
Alkaline Phosphatase: 37 IU/L — ABNORMAL LOW (ref 44–121)
BUN/Creatinine Ratio: 14 (ref 9–23)
BUN: 11 mg/dL (ref 6–24)
Bilirubin Total: 0.2 mg/dL (ref 0.0–1.2)
CO2: 22 mmol/L (ref 20–29)
Calcium: 9.7 mg/dL (ref 8.7–10.2)
Chloride: 103 mmol/L (ref 96–106)
Creatinine, Ser: 0.76 mg/dL (ref 0.57–1.00)
Globulin, Total: 2.9 g/dL (ref 1.5–4.5)
Glucose: 86 mg/dL (ref 70–99)
Potassium: 5 mmol/L (ref 3.5–5.2)
Sodium: 141 mmol/L (ref 134–144)
Total Protein: 7.5 g/dL (ref 6.0–8.5)
eGFR: 91 mL/min/{1.73_m2} (ref >59)

## 2022-11-01 LAB — CBC
Hematocrit: 36.7 % (ref 34.0–46.6)
Hemoglobin: 12.7 g/dL (ref 11.1–15.9)
MCH: 30.4 pg (ref 26.6–33.0)
MCHC: 34.6 g/dL (ref 31.5–35.7)
MCV: 88 fL (ref 79–97)
Platelets: 273 10*3/uL (ref 150–450)
RBC: 4.18 x10E6/uL (ref 3.77–5.28)
RDW: 13 % (ref 11.7–15.4)
WBC: 2.8 10*3/uL — ABNORMAL LOW (ref 3.4–10.8)

## 2022-11-01 LAB — LIPID PANEL
Chol/HDL Ratio: 5.6 ratio — ABNORMAL HIGH (ref 0.0–4.4)
Cholesterol, Total: 377 mg/dL — ABNORMAL HIGH (ref 100–199)
HDL: 67 mg/dL (ref >39)
LDL Chol Calc (NIH): 293 mg/dL — ABNORMAL HIGH (ref 0–99)
Triglycerides: 104 mg/dL (ref 0–149)
VLDL Cholesterol Cal: 17 mg/dL (ref 5–40)

## 2022-11-01 LAB — VITAMIN D 25 HYDROXY (VIT D DEFICIENCY, FRACTURES): Vit D, 25-Hydroxy: 33.3 ng/mL (ref 30.0–100.0)

## 2022-11-01 LAB — HEMOGLOBIN A1C
Est. average glucose Bld gHb Est-mCnc: 111 mg/dL
Hgb A1c MFr Bld: 5.5 % (ref 4.8–5.6)

## 2022-11-01 LAB — TSH+FREE T4
Free T4: 1.35 ng/dL (ref 0.82–1.77)
TSH: 0.819 u[IU]/mL (ref 0.450–4.500)

## 2022-11-22 ENCOUNTER — Other Ambulatory Visit: Payer: Self-pay | Admitting: Gastroenterology

## 2022-11-26 ENCOUNTER — Ambulatory Visit: Payer: Managed Care, Other (non HMO) | Admitting: Dermatology

## 2022-11-27 ENCOUNTER — Encounter: Payer: Self-pay | Admitting: Gastroenterology

## 2022-11-27 ENCOUNTER — Ambulatory Visit (INDEPENDENT_AMBULATORY_CARE_PROVIDER_SITE_OTHER): Payer: Managed Care, Other (non HMO) | Admitting: Gastroenterology

## 2022-11-27 VITALS — BP 134/82 | HR 80 | Temp 97.7°F | Ht 64.0 in | Wt 134.2 lb

## 2022-11-27 DIAGNOSIS — K5100 Ulcerative (chronic) pancolitis without complications: Secondary | ICD-10-CM

## 2022-11-27 MED ORDER — RINVOQ 15 MG PO TB24: 1.0000 | ORAL_TABLET | Freq: Every day | ORAL | 2 refills | Status: DC

## 2022-11-27 NOTE — Progress Notes (Signed)
Natasha Darby, MD [address]  [place]  [place], [place] [address]  Main: [phone]  Fax: [phone]    Gastroenterology Consultation  Referring Provider:     Ronnell Freshwater, NP Primary Care Physician:  Ronnell Freshwater, NP Primary Gastroenterologist:  Dr. Cephas Mueller Reason for Consultation:     Ulcerative pancolitis        HPI:   Natasha Mueller is a 57 y.o. female referred by Dr. Ronnell Freshwater, NP  for consultation & management of UC  Ulcerative colitis diagnosed in 2006, previously on 5-ASA compounds without any response, switched to 6-MP which patient has been taking intermittently.  Patient was recently admitted to [facility] on 01/11/2019 due to abdominal pain, fever and CT revealed inflammation in her colon.  She was empirically started on antibiotics after stool studies came back negative for infection including C. difficile.  She was also found to have elevated CRP 13.7, ESR 60 as well as new onset of normocytic anemia.  Because she reported that she was steroid resistant in the past, she was not started on steroids on admission.  Due to persistent symptoms of abdominal pain, diarrhea started her on IV Solu-Medrol.  Subsequently, she underwent colonoscopy on 01/15/2019 which revealed mild to moderate continuous inflammation, severe in the right colon compared to left colon, advanced to transverse colon only due to severity of inflammation.  She was improving after discontinuation of antibiotics, she was transitioned to p.o. prednisone and was discharged home.  Follow-up visit 05/06/2019 Patient started [facility], received a second induction dose so far.  She reports doing well overall other than mild abdominal discomfort, 3-4 loose bowel movements daily.  She has been gaining weight.  She denies nausea, vomiting, rectal bleeding, fatigue.  She is receiving home infusion of Entyvio and tolerating it very well.  She stopped mercaptopurine and not on  prednisone  Follow-up visit 08/05/2019 Patient continues to take Entyvio.  Received first maintenance dose about a week ago.  She is tolerating infusions well.  She noticed that by end of week 6, she had increased bowel frequency, up to 4 bowel movements a day associated with mild abdominal discomfort.  She denied any blood in the stool.  She also noticed large joint arthralgias as well as severe bilateral hip pain.  After the infusion, her GI symptoms resolved.  Currently, she is not experiencing any arthralgias.  She was wondering if the medication was wearing off by week 6 and if she needs it at a closer interval.  She does report fatigue and started taking vitamin B12 daily.  She also notices that she takes some time for clearing of her vision in the morning.  She does have glasses but has not seen an ophthalmologist.  She denies double vision, pain in her eyes, redness of the eyes.  Follow-up visit 11/29/20 Natasha Mueller has not seen me for more than a year.  She has history of panulcerative colitis, which is quiesced sent and currently maintained on Entyvio every 6 weeks.  She does not have any concerns today.  She is receiving home infusions.  She wants to know if she can get extra IV fluids after each infusion.  She does not have any other concerns otherwise.  She denies any GI symptoms.  She reports having regular bowel movements.  She is currently working from home.  She is getting maintenance labs every other infusion and have been unremarkable.  She prefers not to receive  COVID-19 vaccine  Follow-up visit 06/25/2021 Patient is here for follow-up of ulcerative colitis.  She is currently on Entyvio every 8 weeks.  Patient reports that she has been noticing 3 loose bowel movements daily associated with some abdominal cramps since increasing interval from 6 weeks to 8 weeks.  She also has been having difficulty with IV access for infusion.  She wants to know if she can switch to injectable medication.  Her  weight has been stable.  She is also diagnosed with atopic dermatitis, started on Dupixent about 2 months ago and reports that her skin lesions have been improving well   IBD diagnosis: Ulcerative colitis diagnosed in 2006   Disease course: -- 12/11/04: CC - abdominal pain & 10-15 episodes of diarrhea with frank blood per day x 3 months. -- 12/13/04: CSY - congested, erythematous, friable, granular, hemorrhagic, and ulcerated mucosa in rectum, sigmoid, splenic flexure, transverse, hepatic flexure, and ascending colon (path: active colitis with crypt abscess formation c/w IBD).  She was initially treated with Asacol and Lialda with no clinical response.  Later switched to 6-MP, initially 100 mg, gradually decreased to 50 mg as she said she was not tolerating it well.   She reports having intermittent symptoms for which she has been taking prednisone on and off as well as 6-MP as needed --12/2018: Admitted to [facility] due to flareup of ulcerative colitis, colonoscopy confirmed moderate to severe pancolitis. Entyvio started in 03/2019.  Off prednisone and 6-MP     Extra intestinal manifestations: None   IBD surgical history: None   Imaging:   MRE none CTE none CT abdomen and pelvis with contrast on 01/10/2019 1. Irregular thickening of the wall of the left transverse colon with mild associated inflammation, consistent with active inflammatory bowel disease. No abscess or extraluminal/free air. No evidence of bowel obstruction. Associated mild mesenteric adenopathy. 2. No other acute abnormality within the abdomen or pelvis. SBFT none   Procedures:   Colonoscopy -- 12/13/04: CSY - congested, erythematous, friable, granular, hemorrhagic, and ulcerated mucosa in rectum, sigmoid, splenic flexure, transverse, hepatic flexure, and ascending colon (path: active colitis with crypt abscess formation c/w IBD). -- 03/07/05: CSY - normal TI and cecum, 6-8 mm hyperplastic in transverse colon. -- 01/17/10: CSY  - sigmoid/descending/ascending diverticulosis, pseudopolyps in splenic flexure, internal hemorrhoids (path: colonic mucosa w/o pathologic abnormalities). 11/10/13: CSY - normal TI, 3 diminutive polyps at hepatic flexure (path: focal glandular hyperplasia and lymphoid aggregates), sigmoid diverticulosis, small internal hemorrhoids (random bx path: colonic mucosa focal glandular hyperplasia and lymphoid aggregates);  01/15/2019 - Diffuse moderate inflammation was found in the transverse colon secondary to colitis. Biopsied. - Diffuse mild inflammation was found in the descending colon secondary to colitis. - The sigmoid colon is normal. Biopsied. - Diffuse mild inflammation was found in the rectum secondary to colitis. Biopsied. - Diverticulosis in the sigmoid colon, in the descending colon and in the transverse colon. DIAGNOSIS:  A. COLON, TRANSVERSE; COLD BIOPSY:  - CHRONIC COLITIS WITH SEVERE ACTIVITY (CRYPT DESTRUCTION, CRYPT  ABSCESSES, AND CRYPTITIS).  - NEGATIVE FOR GRANULOMA, DYSPLASIA, AND MALIGNANCY.   B. COLON, DESCENDING; COLD BIOPSY:  - CHRONIC COLITIS WITH MODERATE ACTIVITY (CRYPTITIS AND CRYPT  ABSCESSES).  - NEGATIVE FOR GRANULOMA, DYSPLASIA, AND MALIGNANCY.   C. COLON, SIGMOID; COLD BIOPSY:  - CHRONIC COLITIS WITHOUT ACTIVITY.  - NEGATIVE FOR GRANULOMA, DYSPLASIA, AND MALIGNANCY.   D. RECTUM; COLD BIOPSY:  - CHRONIC PROCTITIS WITH OUT ACTIVITY.  - NEGATIVE FOR GRANULOMA, DYSPLASIA, AND MALIGNANCY.  Upper Endoscopy 11/10/13: EGD - normal esophagus, single fundic gland polyp, normal duodenum.  - The examined portion of the ileum was normal. - Normal mucosa in the entire examined colon. Chromoscopy performed. Biopsied. - Two small polyps in the ascending colon, removed with a cold snare. Resected and retrieved. - Diverticulosis in the sigmoid colon and in the descending colon. - Pseudopolyps in the descending colon. - The distal rectum and anal verge are normal on  retroflexion view. - Inactive (Mayo Score 0) ulcerative colitis, in remission, improved since the last examination.  DIAGNOSIS:  A. COLON, RIGHT, RANDOM; BIOPSIES:  - NEGATIVE FOR ACTIVE MUCOSAL COLITIS, DYSPLASIA AND MALIGNANCY.   B. COLON POLYPS X2, ASCENDING; BIOPSY:  - MINUTE TUBULAR ADENOMA AND A FRAGMENT OF POLYPOID BENIGN COLONIC  MUCOSA.  - NEGATIVE FOR HIGH-GRADE DYSPLASIA AND MALIGNANCY.   C. COLON, LEFT, RANDOM; BIOPSY:  - NEGATIVE FOR ACTIVE MUCOSAL COLITIS, DYSPLASIA AND MALIGNANCY.  - FEW REACTIVE LYMPHOID AGGREGATES.  - FEW FRAGMENTS WITH MUCOSAL HEMORRHAGE.   Past Medical History:  Diagnosis Date   Allergy    BRCA negative 2013   with BART   Family history of breast cancer    BRCA/Bart neg 2013; IBIS=15%   Squamous cell carcinoma of skin 10/31/2009   Left superior clavicle/medial shoulder   UC (ulcerative colitis) ([place])     Past Surgical History:  Procedure Laterality Date   CESAREAN SECTION     COLONOSCOPY WITH PROPOFOL N/A 01/15/2019   Procedure: COLONOSCOPY WITH PROPOFOL;  Surgeon: Lucilla Lame, MD;  Location: [facility] ENDOSCOPY;  Service: Endoscopy;  Laterality: N/A;   COLONOSCOPY WITH PROPOFOL N/A 08/27/2019   Procedure: COLONOSCOPY WITH PROPOFOL;  Surgeon: Lin Landsman, MD;  Location: [facility] ENDOSCOPY;  Service: Gastroenterology;  Laterality: N/A;    Current Outpatient Medications:    clobetasol (TEMOVATE) 0.05 % external solution, PATIENT TO MIX IN 1 JAR OF CERAVE CREAM AND APPLY ALL OVER TWICE DAILY UNTIL ITCH IMPROVED. AVOID FACE., Disp: 50 mL, Rfl: 0   estradiol (ESTRACE) 0.5 MG tablet, Take 1 tablet (0.5 mg total) by mouth daily., Disp: 90 tablet, Rfl: 1   progesterone (PROMETRIUM) 100 MG capsule, Take 1 capsule (100 mg total) by mouth daily., Disp: 90 capsule, Rfl: 1   RINVOQ 30 MG TB24, TAKE 1 TABLET DAILY (NEED APPOINTMENT), Disp: 30 tablet, Rfl: 0   sertraline (ZOLOFT) 50 MG tablet, Take 1.5 tablets (75 mg total) by mouth daily., Disp: 135  tablet, Rfl: 1    Family History  Problem Relation Age of Onset   Breast cancer Mother 13   Hypertension Mother    Atrial fibrillation Father    Bladder Cancer Father 54   Diabetes Sister    Breast cancer Sister    Atrial fibrillation Brother      Social History   Tobacco Use   Smoking status: Never   Smokeless tobacco: Never  Vaping Use   Vaping Use: Never used  Substance Use Topics   Alcohol use: No   Drug use: No    Allergies as of 11/27/2022 - Review Complete 11/27/2022  Allergen Reaction Noted   Hyoscyamine sulfate Swelling 03/13/2015    Review of Systems:    All systems reviewed and negative except where noted in HPI.   Physical Exam:  BP 134/82 (BP Location: Right Arm, Patient Position: Sitting, Cuff Size: Normal)   Pulse 80   Temp 97.7 F (36.5 C) (Oral)   Ht '5\' 4"'$  (1.626 m)   Wt 134 lb 4 oz (60.9  kg)   LMP 06/14/2016 (Exact Date)   BMI 23.04 kg/m  Patient's last menstrual period was 06/14/2016 (exact date).  General:   Alert,  Well-developed, well-nourished, pleasant and cooperative in NAD Head:  Normocephalic and atraumatic. Eyes:  Sclera clear, no icterus.   Conjunctiva pink. Ears:  Normal auditory acuity. Nose:  No deformity, discharge, or lesions. Mouth:  No deformity or lesions,oropharynx pink & moist. Neck:  Supple; no masses or thyromegaly. Lungs:  Respirations even and unlabored.  Clear throughout to auscultation.   No wheezes, crackles, or rhonchi. No acute distress. Heart:  Regular rate and rhythm; no murmurs, clicks, rubs, or gallops. Abdomen:  Normal bowel sounds. Soft, non-tender and non-distended without masses, hepatosplenomegaly or hernias noted.  No guarding or rebound tenderness.   Rectal: Not performed Msk:  Symmetrical without gross deformities. Good, equal movement & strength bilaterally. Pulses:  Normal pulses noted. Extremities:  No clubbing or edema.  No cyanosis. Neurologic:  Alert and oriented x3;  grossly normal  neurologically. Skin:  Intact without significant lesions or rashes. No jaundice. Psych:  Alert and cooperative. Normal mood and affect.  Imaging Studies: Reviewed  Assessment and Plan:   Natasha Mueller is a 57 y.o. female with history of ulcerative pancolitis since 2006, moderately severe, previously maintained on 6-MP, colonoscopy in 12/2018 confirmed exacerbation of ulcerative colitis responsive to prednisone, started Entyvio monotherapy in 03/2019, currently in clinical remission.   Ulcerative pancolitis: In histologic remission based on the colonoscopy in 07/2019, currently on Entyvio monotherapy, increased interval to every 8 weeks from 6 weeks since 11/2020.  She is experiencing loose bowel movements daily which is new Check fecal calprotectin levels Recommend colonoscopy with biopsies and dysplasia surveillance Patient will inform me if she has any breakthrough symptoms Check CBC and CMP  IBD Health Maintenance   1.TB status: QuantiFERON gold negative on 01/15/2019 2. Anemia: No evidence of anemia, normal iron and B12 stores 3.Immunizations: Hep A and B  received second dose, Influenza recommend annually, prevnar administered in 03/2019, pneumovax administered in 05/2019, Varicella unknown, Shingrix vaccine received first dose in 03/2019, second dose in 05/2019 4.Cancer screening I) Colon cancer/dysplasia surveillance: No evidence of dysplasia to date Recommend colonoscopy for dysplasia surveillance with chromoendoscopy in 2023 II) Cervical cancer: Annual Pap smear up to date, reportedly negative III) Skin cancer - counseled about annual skin exam by dermatology and skin protection in summer using sun screen SPF > 39, clothing.  She has history of squamous cell carcinoma of the skin about 5 to 6 years ago.  Seen by dermatologist within last 1 year.  Recommend her to have annual surveillance of her skin by dermatologist.  Currently treated for atopic dermatitis with Dupixent 5.Bone  health Vitamin D status:  vitamin D levels normal Bone density testing: Not done 5. Labs: Every 3 months 6. Smoking: None 7. NSAIDs and Antibiotics use: None   Follow up in 3 months   Natasha Darby, MD

## 2022-11-28 ENCOUNTER — Telehealth: Payer: Self-pay

## 2022-11-28 NOTE — Telephone Encounter (Signed)
Called [facility] the patient specility pharmacy and canceled the prescription for Rinvoq '30mg'$  and told them to fill the Rinvoq '15mg'$ .

## 2022-11-29 ENCOUNTER — Telehealth: Payer: Self-pay

## 2022-11-29 ENCOUNTER — Other Ambulatory Visit: Payer: Self-pay

## 2022-11-29 DIAGNOSIS — K5100 Ulcerative (chronic) pancolitis without complications: Secondary | ICD-10-CM

## 2022-11-29 MED ORDER — NA SULFATE-K SULFATE-MG SULF 17.5-3.13-1.6 GM/177ML PO SOLN: 354.0000 mL | Freq: Once | ORAL | 0 refills | Status: AC

## 2022-11-29 NOTE — Telephone Encounter (Signed)
Called and left a message for call back  

## 2022-11-29 NOTE — Telephone Encounter (Signed)
Patient returned call and schedule colonoscopy for 12/18/2022. Went over instructions with patient, mailed them and sent to [facility]. Sent prep to the pharmacy

## 2022-11-29 NOTE — Telephone Encounter (Signed)
-----   Message from Lin Landsman, MD sent at 11/28/2022  5:34 PM EST ----- Regarding: Colonoscopy Natasha Mueller  Natasha Mueller is past due for her surveillance colonoscopy given her longstanding history of ulcerative colitis.  I forgot to discuss with her during office visit.  Please schedule colonoscopy if patient is agreeable  Thanks RV

## 2022-12-18 ENCOUNTER — Encounter
Admission: RE | Disposition: A | Discharge status: Home / Self Care | Payer: Self-pay | Source: Home / Self Care | Attending: Gastroenterology

## 2022-12-18 ENCOUNTER — Ambulatory Visit: Payer: Managed Care, Other (non HMO) | Admitting: Registered Nurse

## 2022-12-18 ENCOUNTER — Encounter: Payer: Self-pay | Admitting: Gastroenterology

## 2022-12-18 ENCOUNTER — Ambulatory Visit
Admission: RE | Admit: 2022-12-18 | Discharge: 2022-12-18 | Disposition: A | Discharge status: Home / Self Care | Payer: Managed Care, Other (non HMO) | Attending: Gastroenterology | Admitting: Gastroenterology

## 2022-12-18 DIAGNOSIS — K5100 Ulcerative (chronic) pancolitis without complications: Secondary | ICD-10-CM | POA: Diagnosis not present

## 2022-12-18 DIAGNOSIS — K529 Noninfective gastroenteritis and colitis, unspecified: Secondary | ICD-10-CM | POA: Insufficient documentation

## 2022-12-18 DIAGNOSIS — Z1211 Encounter for screening for malignant neoplasm of colon: Secondary | ICD-10-CM | POA: Insufficient documentation

## 2022-12-18 DIAGNOSIS — E669 Obesity, unspecified: Secondary | ICD-10-CM | POA: Diagnosis not present

## 2022-12-18 DIAGNOSIS — Z6823 Body mass index (BMI) 23.0-23.9, adult: Secondary | ICD-10-CM | POA: Diagnosis not present

## 2022-12-18 HISTORY — PX: COLONOSCOPY WITH PROPOFOL: SHX5780

## 2022-12-18 SURGERY — COLONOSCOPY WITH PROPOFOL
Anesthesia: General

## 2022-12-18 MED ORDER — LIDOCAINE HCL (CARDIAC) PF 100 MG/5ML IV SOSY
PREFILLED_SYRINGE | INTRAVENOUS | Status: DC | PRN
  Administered 2022-12-18: 40 mg via INTRAVENOUS

## 2022-12-18 MED ORDER — PROPOFOL 1000 MG/100ML IV EMUL
INTRAVENOUS | Status: AC
  Filled 2022-12-18: qty 100

## 2022-12-18 MED ORDER — DEXMEDETOMIDINE HCL IN NACL 200 MCG/50ML IV SOLN
INTRAVENOUS | Status: DC | PRN
  Administered 2022-12-18: 8 ug via INTRAVENOUS

## 2022-12-18 MED ORDER — PROPOFOL 10 MG/ML IV BOLUS
INTRAVENOUS | Status: DC | PRN
  Administered 2022-12-18: 80 mg via INTRAVENOUS

## 2022-12-18 MED ORDER — LIDOCAINE HCL (PF) 2 % IJ SOLN
INTRAMUSCULAR | Status: AC
  Filled 2022-12-18: qty 5

## 2022-12-18 MED ORDER — PROPOFOL 500 MG/50ML IV EMUL
INTRAVENOUS | Status: DC | PRN
  Administered 2022-12-18: 150 ug/kg/min via INTRAVENOUS

## 2022-12-18 MED ORDER — SODIUM CHLORIDE 0.9 % IV SOLN: INTRAVENOUS | Status: DC

## 2022-12-18 NOTE — H&P (Signed)
Natasha Darby, MD [address]  [place]  [place], [place] [address]  Main: [phone]  Fax: [phone] Pager: [phone]  Primary Care Physician:  Ronnell Freshwater, NP Primary Gastroenterologist:  Dr. Cephas Mueller  Pre-Procedure History & Physical: HPI:  Natasha Mueller is a 57 y.o. female is here for an colonoscopy.   Past Medical History:  Diagnosis Date   Allergy    BRCA negative 2013   with BART   Family history of breast cancer    BRCA/Bart neg 2013; IBIS=15%   Squamous cell carcinoma of skin 10/31/2009   Left superior clavicle/medial shoulder   UC (ulcerative colitis) ([place])     Past Surgical History:  Procedure Laterality Date   CESAREAN SECTION     COLONOSCOPY WITH PROPOFOL N/A 01/15/2019   Procedure: COLONOSCOPY WITH PROPOFOL;  Surgeon: Lucilla Lame, MD;  Location: [facility] ENDOSCOPY;  Service: Endoscopy;  Laterality: N/A;   COLONOSCOPY WITH PROPOFOL N/A 08/27/2019   Procedure: COLONOSCOPY WITH PROPOFOL;  Surgeon: Lin Landsman, MD;  Location: [facility] ENDOSCOPY;  Service: Gastroenterology;  Laterality: N/A;    Prior to Admission medications   Medication Sig Start Date End Date Taking? Authorizing Provider  clobetasol (TEMOVATE) 0.05 % external solution PATIENT TO MIX IN 1 JAR OF CERAVE CREAM AND APPLY ALL OVER TWICE DAILY UNTIL ITCH IMPROVED. AVOID FACE. 01/16/22   Brendolyn Patty, MD  estradiol (ESTRACE) 0.5 MG tablet Take 1 tablet (0.5 mg total) by mouth daily. 10/31/22   Ronnell Freshwater, NP  progesterone (PROMETRIUM) 100 MG capsule Take 1 capsule (100 mg total) by mouth daily. 10/31/22   Ronnell Freshwater, NP  sertraline (ZOLOFT) 50 MG tablet Take 1.5 tablets (75 mg total) by mouth daily. 10/31/22   Ronnell Freshwater, NP  Upadacitinib ER (RINVOQ) 15 MG TB24 Take 1 tablet (15 mg total) by mouth daily. 11/27/22   Lin Landsman, MD    Allergies as of 11/29/2022 - Review Complete 11/27/2022  Allergen Reaction Noted   Hyoscyamine sulfate Swelling  03/13/2015    Family History  Problem Relation Age of Onset   Breast cancer Mother 93   Hypertension Mother    Atrial fibrillation Father    Bladder Cancer Father 67   Diabetes Sister    Breast cancer Sister    Atrial fibrillation Brother     Social History   Socioeconomic History   Marital status: Divorced    Spouse name: Not on file   Number of children: Not on file   Years of education: Not on file   Highest education level: Not on file  Occupational History   Not on file  Tobacco Use   Smoking status: Never   Smokeless tobacco: Never  Vaping Use   Vaping Use: Never used  Substance and Sexual Activity   Alcohol use: No   Drug use: No   Sexual activity: Not Currently    Birth control/protection: Post-menopausal  Other Topics Concern   Not on file  Social History Narrative   Not on file   Social Determinants of Health   Financial Resource Strain: Not on file  Food Insecurity: Not on file  Transportation Needs: Not on file  Physical Activity: Not on file  Stress: Not on file  Social Connections: Not on file  Intimate Partner Violence: Not on file    Review of Systems: See HPI, otherwise negative ROS  Physical Exam: BP (!) 86/64 (BP Location: Right Arm) Comment: Simultaneous filing. User may not have  seen previous data.  Pulse 85 Comment: Simultaneous filing. User may not have seen previous data.  Temp (!) 97 F (36.1 C) (Temporal)   Resp 15 Comment: Simultaneous filing. User may not have seen previous data.  Ht 5' 4"$  (1.626 m)   Wt 129 lb (58.5 kg)   LMP 06/14/2016 (Exact Date)   SpO2 100% Comment: Simultaneous filing. User may not have seen previous data.  BMI 22.14 kg/m  General:   Alert,  pleasant and cooperative in NAD Head:  Normocephalic and atraumatic. Neck:  Supple; no masses or thyromegaly. Lungs:  Clear throughout to auscultation.    Heart:  Regular rate and rhythm. Abdomen:  Soft, nontender and nondistended. Normal bowel sounds, without  guarding, and without rebound.   Neurologic:  Alert and  oriented x4;  grossly normal neurologically.  Impression/Plan: Natasha Mueller is here for an colonoscopy to be performed for surveillance colonoscopy  Risks, benefits, limitations, and alternatives regarding  colonoscopy have been reviewed with the patient.  Questions have been answered.  All parties agreeable.   Sherri Sear, MD  12/18/2022, 10:39 AM

## 2022-12-18 NOTE — Anesthesia Preprocedure Evaluation (Signed)
Anesthesia Evaluation  Patient identified by MRN, date of birth, ID band Patient awake    Reviewed: Allergy & Precautions, NPO status , Patient's Chart, lab work & pertinent test results  History of Anesthesia Complications Negative for: history of anesthetic complications  Airway Mallampati: II  TM Distance: >3 FB Neck ROM: Full    Dental no notable dental hx.    Pulmonary neg pulmonary ROS, neg sleep apnea, neg COPD   breath sounds clear to auscultation- rhonchi (-) wheezing      Cardiovascular Exercise Tolerance: Good (-) hypertension(-) CAD, (-) Past MI, (-) Cardiac Stents and (-) CABG  Rhythm:Regular Rate:Normal - Systolic murmurs and - Diastolic murmurs    Neuro/Psych neg Seizures PSYCHIATRIC DISORDERS Anxiety     negative neurological ROS     GI/Hepatic Neg liver ROS, PUD,,,  Endo/Other  negative endocrine ROSneg diabetes    Renal/GU negative Renal ROS     Musculoskeletal negative musculoskeletal ROS (+)    Abdominal  (+) - obese  Peds  Hematology negative hematology ROS (+)   Anesthesia Other Findings Past Medical History: No date: Allergy 2013: BRCA negative     Comment:  with BART No date: Family history of breast cancer     Comment:  BRCA/Bart neg 2013; IBIS=15% No date: UC (ulcerative colitis) ([place])   Reproductive/Obstetrics                             Anesthesia Physical Anesthesia Plan  ASA: II  Anesthesia Plan: General   Post-op Pain Management:    Induction: Intravenous  PONV Risk Score and Plan: 3 and Propofol infusion, TIVA and Treatment may vary due to age or medical condition  Airway Management Planned: Natural Airway and Nasal Cannula  Additional Equipment:   Intra-op Plan:   Post-operative Plan:   Informed Consent: I have reviewed the patients History and Physical, chart, labs and discussed the procedure including the risks, benefits and  alternatives for the proposed anesthesia with the patient or authorized representative who has indicated his/her understanding and acceptance.     Dental advisory given  Plan Discussed with: CRNA and Anesthesiologist  Anesthesia Plan Comments:         Anesthesia Quick Evaluation

## 2022-12-18 NOTE — Transfer of Care (Signed)
Immediate Anesthesia Transfer of Care Note  Patient: Natasha Mueller  Procedure(s) Performed: Procedure(s): COLONOSCOPY WITH PROPOFOL (N/A)  Patient Location: PACU and Endoscopy Unit  Anesthesia Type:General  Level of Consciousness: sedated  Airway & Oxygen Therapy: Patient Spontanous Breathing and Patient connected to nasal cannula oxygen  Post-op Assessment: Report given to RN and Post -op Vital signs reviewed and stable  Post vital signs: Reviewed and stable  Last Vitals:  Vitals:   12/18/22 0944 12/18/22 1023  BP: (!) 136/90 (!) 86/64  Pulse: 87 85  Resp: 16 15  Temp: (!) 36.1 C   SpO2: [id number]    Complications: No apparent anesthesia complications

## 2022-12-18 NOTE — Anesthesia Procedure Notes (Signed)
Date/Time: 12/18/2022 10:01 AM  Performed by: Doreen Salvage, CRNAPre-anesthesia Checklist: Patient identified, Emergency Drugs available, Suction available and Patient being monitored Patient Re-evaluated:Patient Re-evaluated prior to induction Oxygen Delivery Method: Nasal cannula Induction Type: IV induction Dental Injury: Teeth and Oropharynx as per pre-operative assessment  Comments: Nasal cannula with etCO2 monitoring

## 2022-12-18 NOTE — Op Note (Signed)
[facility] Gastroenterology Patient Name: Natasha Mueller Procedure Date: 12/18/2022 9:20 AM MRN: [id number] Account #: [id number] Date of Birth: 08-Oct-1966 Admit Type: Outpatient Age: 57 Room: [facility] ENDO ROOM 4 Gender: Female Note Status: Finalized Instrument Name: Peds Colonoscope [id number] Procedure:             Colonoscopy Indications:           High risk colon cancer surveillance: Ulcerative                         pancolitis of 8 (or more) years duration, Last                         colonoscopy: October 2020 Providers:             Lin Landsman MD, MD Referring MD:          No Local Md, MD (Referring MD) Medicines:             General Anesthesia Complications:         No immediate complications. Estimated blood loss: None. Procedure:             Pre-Anesthesia Assessment:                        - Prior to the procedure, a History and Physical was                         performed, and patient medications and allergies were                         reviewed. The patient is competent. The risks and                         benefits of the procedure and the sedation options and                         risks were discussed with the patient. All questions                         were answered and informed consent was obtained.                         Patient identification and proposed procedure were                         verified by the physician, the nurse, the                         anesthesiologist, the anesthetist and the technician                         in the pre-procedure area in the procedure room in the                         endoscopy suite. Mental Status Examination: alert and                         oriented. Airway Examination: normal oropharyngeal  airway and neck mobility. Respiratory Examination:                         clear to auscultation. CV Examination: normal.                         Prophylactic Antibiotics: The  patient does not require                         prophylactic antibiotics. Prior Anticoagulants: The                         patient has taken no anticoagulant or antiplatelet                         agents. ASA Grade Assessment: II - A patient with mild                         systemic disease. After reviewing the risks and                         benefits, the patient was deemed in satisfactory                         condition to undergo the procedure. The anesthesia                         plan was to use general anesthesia. Immediately prior                         to administration of medications, the patient was                         re-assessed for adequacy to receive sedatives. The                         heart rate, respiratory rate, oxygen saturations,                         blood pressure, adequacy of pulmonary ventilation, and                         response to care were monitored throughout the                         procedure. The physical status of the patient was                         re-assessed after the procedure.                        After obtaining informed consent, the colonoscope was                         passed under direct vision. Throughout the procedure,                         the patient's blood pressure, pulse, and oxygen  saturations were monitored continuously. The                         Colonoscope was introduced through the anus and                         advanced to the 10 cm into the ileum. The colonoscopy                         was performed without difficulty. The patient                         tolerated the procedure well. The quality of the bowel                         preparation was evaluated using the BBPS [facility] Bowel                         Preparation Scale) with scores of: Right Colon = 3,                         Transverse Colon = 3 and Left Colon = 3 (entire mucosa                         seen well  with no residual staining, small fragments                         of stool or opaque liquid). The total BBPS score                         equals 9. The terminal ileum, ileocecal valve,                         appendiceal orifice, and rectum were photographed. Findings:      The perianal and digital rectal examinations were normal. Pertinent       negatives include normal sphincter tone and no palpable rectal lesions.      The terminal ileum appeared normal.      Inflammation was not found based on the endoscopic appearance of the       mucosa in the colon. This was graded as Mayo Score 0 (normal or inactive       disease), and when compared to the previous examination, the findings       are in remission. Biopsies were taken with a cold forceps for histology.      The retroflexed view of the distal rectum and anal verge was normal and       showed no anal or rectal abnormalities. Impression:            - The examined portion of the ileum was normal.                        - Inactive (Mayo Score 0) ulcerative colitis, in                         remission, in remission since the last examination.  Biopsied.                        - The distal rectum and anal verge are normal on                         retroflexion view. Recommendation:        - Discharge patient to home (with escort).                        - Resume previous diet today.                        - Continue present medications.                        - Await pathology results.                        - Repeat colonoscopy in 5 years for surveillance.                        - Return to my office as previously scheduled. Procedure Code(s):     --- Professional ---                        [id number], Colonoscopy, flexible; with biopsy, single or                         multiple Diagnosis Code(s):     --- Professional ---                        K51.00, Ulcerative (chronic) pancolitis without                          complications CPT copyright 2022 American Medical Association. All rights reserved. The codes documented in this report are preliminary and upon coder review may  be revised to meet current compliance requirements. Dr. Ulyess Mort Lin Landsman MD, MD 12/18/2022 10:23:29 AM This report has been signed electronically. Number of Addenda: 0 Note Initiated On: 12/18/2022 9:20 AM Scope Withdrawal Time: 0 hours 15 minutes 24 seconds  Total Procedure Duration: 0 hours 20 minutes 54 seconds  Estimated Blood Loss:  Estimated blood loss: none.      [facility]

## 2022-12-19 ENCOUNTER — Encounter: Payer: Self-pay | Admitting: Gastroenterology

## 2022-12-19 LAB — SURGICAL PATHOLOGY

## 2022-12-19 NOTE — Anesthesia Postprocedure Evaluation (Signed)
Anesthesia Post Note  Patient: Natasha Mueller  Procedure(s) Performed: COLONOSCOPY WITH PROPOFOL  Patient location during evaluation: Endoscopy Anesthesia Type: General Level of consciousness: awake and alert Pain management: pain level controlled Vital Signs Assessment: post-procedure vital signs reviewed and stable Respiratory status: spontaneous breathing, nonlabored ventilation, respiratory function stable and patient connected to nasal cannula oxygen Cardiovascular status: blood pressure returned to baseline and stable Postop Assessment: no apparent nausea or vomiting Anesthetic complications: no   No notable events documented.   Last Vitals:  Vitals:   12/18/22 1023 12/18/22 1028  BP: 119/72 119/72  Pulse: 85 71  Resp: 15   Temp:  (!) 35.9 C  SpO2: 100% 100%    Last Pain:  Vitals:   12/19/22 0730  TempSrc:   PainSc: 0-No pain                 Martha Clan

## 2022-12-22 ENCOUNTER — Encounter: Payer: Self-pay | Admitting: Gastroenterology

## 2022-12-26 ENCOUNTER — Telehealth: Payer: Self-pay

## 2022-12-26 NOTE — Telephone Encounter (Signed)
-----   Message from Natasha Mueller, [place] sent at 11/27/2022  2:24 PM EST ----- Remind patient to come for lab work

## 2022-12-26 NOTE — Telephone Encounter (Signed)
Called patient and left a message for call back  

## 2022-12-27 LAB — HEPATIC FUNCTION PANEL
ALT: 24 IU/L (ref 0–32)
AST: 30 IU/L (ref 0–40)
Albumin: 4.8 g/dL (ref 3.8–4.9)
Alkaline Phosphatase: 55 IU/L (ref 44–121)
Bilirubin Total: <0.2 mg/dL (ref 0.0–1.2)
Bilirubin, Direct: <0.1 mg/dL (ref 0.00–0.40)
Total Protein: 7.4 g/dL (ref 6.0–8.5)

## 2022-12-27 LAB — CBC
Hematocrit: 41.5 % (ref 34.0–46.6)
Hemoglobin: 14 g/dL (ref 11.1–15.9)
MCH: 31.3 pg (ref 26.6–33.0)
MCHC: 33.7 g/dL (ref 31.5–35.7)
MCV: 93 fL (ref 79–97)
Platelets: 357 10*3/uL (ref 150–450)
RBC: 4.47 x10E6/uL (ref 3.77–5.28)
RDW: 12.9 % (ref 11.7–15.4)
WBC: 5.1 10*3/uL (ref 3.4–10.8)

## 2022-12-27 LAB — LIPID PANEL
Chol/HDL Ratio: 4.9 ratio — ABNORMAL HIGH (ref 0.0–4.4)
Cholesterol, Total: 351 mg/dL — ABNORMAL HIGH (ref 100–199)
HDL: 71 mg/dL (ref >39)
LDL Chol Calc (NIH): 238 mg/dL — ABNORMAL HIGH (ref 0–99)
Triglycerides: 212 mg/dL — ABNORMAL HIGH (ref 0–149)
VLDL Cholesterol Cal: 42 mg/dL — ABNORMAL HIGH (ref 5–40)

## 2022-12-27 NOTE — Telephone Encounter (Signed)
Called and left a message for call back. Sent mychart message  °

## 2022-12-30 ENCOUNTER — Telehealth: Payer: Self-pay

## 2022-12-30 ENCOUNTER — Ambulatory Visit (INDEPENDENT_AMBULATORY_CARE_PROVIDER_SITE_OTHER): Payer: Managed Care, Other (non HMO) | Admitting: Dermatology

## 2022-12-30 DIAGNOSIS — L2081 Atopic neurodermatitis: Secondary | ICD-10-CM

## 2022-12-30 DIAGNOSIS — L820 Inflamed seborrheic keratosis: Secondary | ICD-10-CM | POA: Diagnosis not present

## 2022-12-30 NOTE — Telephone Encounter (Signed)
-----   Message from Lin Landsman, MD sent at 12/28/2022 10:42 PM EST ----- Please inform patient that her white cell count is back to normal.  Her liver enzymes are also normal.  However, her cholesterol is still elevated.  Recommend to start low-dose statin  Heather  Could you please start managing her hyperlipidemia with a statin?  It is more than likely that her hyperlipidemia has a secondary etiology and not attributed to just Index

## 2022-12-30 NOTE — Telephone Encounter (Signed)
Patient verbalized understanding of results. She will be looking for a call from PCP about which medication the will prescribe for her.

## 2022-12-30 NOTE — Progress Notes (Signed)
   Follow-Up Visit   Subjective  SORCHA NEUBURGER is a 57 y.o. female who presents for the following: Skin Problem (Patient here today to have some irritated spots at arms, legs, face and back treated that patient picks at. ).  She has atopic dermatitis and is taking Rinvoq with good results.  She has UC also being treated with Rinvoq per GI   The following portions of the chart were reviewed this encounter and updated as appropriate:       Review of Systems:  No other skin or systemic complaints except as noted in HPI or Assessment and Plan.  Objective  Well appearing patient in no apparent distress; mood and affect are within normal limits.  A focused examination was performed including arms, legs, face and back. Relevant physical exam findings are noted in the Assessment and Plan.  R calf x 1, R lower back x 1, L upper arm x 1, L upper elbow x 1, L lower axilla x 3, R axilla x 1, R upper arm x 5, L infraocular x 2 (15) Erythematous stuck-on, waxy papule  arms, legs, trunk Clear     Assessment & Plan  Inflamed seborrheic keratosis (15) R calf x 1, R lower back x 1, L upper arm x 1, L upper elbow x 1, L lower axilla x 3, R axilla x 1, R upper arm x 5, L infraocular x 2  Symptomatic, irritating, patient would like treated.   Destruction of lesion - R calf x 1, R lower back x 1, L upper arm x 1, L upper elbow x 1, L lower axilla x 3, R axilla x 1, R upper arm x 5, L infraocular x 2  Destruction method: cryotherapy   Informed consent: discussed and consent obtained   Lesion destroyed using liquid nitrogen: Yes   Region frozen until ice ball extended beyond lesion: Yes   Outcome: patient tolerated procedure well with no complications   Post-procedure details: wound care instructions given   Additional details:  Prior to procedure, discussed risks of blister formation, small wound, skin dyspigmentation, or rare scar following cryotherapy. Recommend Vaseline ointment to treated  areas while healing.   Related Medications clobetasol (TEMOVATE) 0.05 % external solution PATIENT TO MIX IN 1 JAR OF CERAVE CREAM AND APPLY ALL OVER TWICE DAILY UNTIL ITCH IMPROVED. AVOID FACE.  Atopic neurodermatitis arms, legs, trunk  Chronic condition with duration or expected duration over one year. Currently well-controlled on Rinvoq.  Patient with hx of ulcerative colitis treated with Rinvoq 15 mg per GI.  Recommend mild soap and moisturizing cream 1-2 times daily.  Gentle skin care handout provided.     Return if symptoms worsen or fail to improve.  Graciella Belton, RMA, am acting as scribe for Brendolyn Patty, MD .  Documentation: I have reviewed the above documentation for accuracy and completeness, and I agree with the above.  Brendolyn Patty MD

## 2022-12-30 NOTE — Patient Instructions (Signed)
 Cryotherapy Aftercare  Wash gently with soap and water everyday.   Apply Vaseline and Band-Aid daily until healed. Due to recent changes in healthcare laws, you may see results of your pathology and/or laboratory studies on MyChart before the doctors have had a chance to review them. We understand that in some cases there may be results that are confusing or concerning to you. Please understand that not all results are received at the same time and often the doctors may need to interpret multiple results in order to provide you with the best plan of care or course of treatment. Therefore, we ask that you please give us 2 business days to thoroughly review all your results before contacting the office for clarification. Should we see a critical lab result, you will be contacted sooner.   If You Need Anything After Your Visit  If you have any questions or concerns for your doctor, please call our main line at [phone] and press option 4 to reach your doctor's medical assistant. If no one answers, please leave a voicemail as directed and we will return your call as soon as possible. Messages left after 4 pm will be answered the following business day.   You may also send us a message via MyChart. We typically respond to MyChart messages within 1-2 business days.  For prescription refills, please ask your pharmacy to contact our office. Our fax number is [phone].  If you have an urgent issue when the clinic is closed that cannot wait until the next business day, you can page your doctor at the number below.    Please note that while we do our best to be available for urgent issues outside of office hours, we are not available 24/7.   If you have an urgent issue and are unable to reach us, you may choose to seek medical care at your doctor's office, retail clinic, urgent care center, or emergency room.  If you have a medical emergency, please immediately call 911 or go to the emergency  department.  Pager Numbers  - Dr. Kowalski: [phone]  - Dr. Moye: [phone]  - Dr. Stewart: [phone]  In the event of inclement weather, please call our main line at [phone] for an update on the status of any delays or closures.  Dermatology Medication Tips: Please keep the boxes that topical medications come in in order to help keep track of the instructions about where and how to use these. Pharmacies typically print the medication instructions only on the boxes and not directly on the medication tubes.   If your medication is too expensive, please contact our office at [phone] option 4 or send us a message through MyChart.   We are unable to tell what your co-pay for medications will be in advance as this is different depending on your insurance coverage. However, we may be able to find a substitute medication at lower cost or fill out paperwork to get insurance to cover a needed medication.   If a prior authorization is required to get your medication covered by your insurance company, please allow us 1-2 business days to complete this process.  Drug prices often vary depending on where the prescription is filled and some pharmacies may offer cheaper prices.  The website www.goodrx.com contains coupons for medications through different pharmacies. The prices here do not account for what the cost may be with help from insurance (it may be cheaper with your insurance), but the website can give you the   price if you did not use any insurance.  - You can print the associated coupon and take it with your prescription to the pharmacy.  - You may also stop by our office during regular business hours and pick up a GoodRx coupon card.  - If you need your prescription sent electronically to a different pharmacy, notify our office through [facility] MyChart or by phone at [phone] option 4.     Si Usted Necesita Algo Despus de Su Visita  Tambin puede enviarnos un  mensaje a travs de MyChart. Por lo general respondemos a los mensajes de MyChart en el transcurso de 1 a 2 das hbiles.  Para renovar recetas, por favor pida a su farmacia que se ponga en contacto con nuestra oficina. Nuestro nmero de fax es el [phone].  Si tiene un asunto urgente cuando la clnica est cerrada y que no puede esperar hasta el siguiente da hbil, puede llamar/localizar a su doctor(a) al nmero que aparece a continuacin.   Por favor, tenga en cuenta que aunque hacemos todo lo posible para estar disponibles para asuntos urgentes fuera del horario de oficina, no estamos disponibles las 24 horas del da, los 7 das de la semana.   Si tiene un problema urgente y no puede comunicarse con nosotros, puede optar por buscar atencin mdica  en el consultorio de su doctor(a), en una clnica privada, en un centro de atencin urgente o en una sala de emergencias.  Si tiene una emergencia mdica, por favor llame inmediatamente al 911 o vaya a la sala de emergencias.  Nmeros de bper  - Dr. Kowalski: [phone]  - Dra. Moye: [phone]  - Dra. Stewart: [phone]  En caso de inclemencias del tiempo, por favor llame a nuestra lnea principal al [phone] para una actualizacin sobre el estado de cualquier retraso o cierre.  Consejos para la medicacin en dermatologa: Por favor, guarde las cajas en las que vienen los medicamentos de uso tpico para ayudarle a seguir las instrucciones sobre dnde y cmo usarlos. Las farmacias generalmente imprimen las instrucciones del medicamento slo en las cajas y no directamente en los tubos del medicamento.   Si su medicamento es muy caro, por favor, pngase en contacto con nuestra oficina llamando al [phone] y presione la opcin 4 o envenos un mensaje a travs de MyChart.   No podemos decirle cul ser su copago por los medicamentos por adelantado ya que esto es diferente dependiendo de la cobertura de su seguro. Sin embargo,  es posible que podamos encontrar un medicamento sustituto a menor costo o llenar un formulario para que el seguro cubra el medicamento que se considera necesario.   Si se requiere una autorizacin previa para que su compaa de seguros cubra su medicamento, por favor permtanos de 1 a 2 das hbiles para completar este proceso.  Los precios de los medicamentos varan con frecuencia dependiendo del lugar de dnde se surte la receta y alguna farmacias pueden ofrecer precios ms baratos.  El sitio web www.goodrx.com tiene cupones para medicamentos de diferentes farmacias. Los precios aqu no tienen en cuenta lo que podra costar con la ayuda del seguro (puede ser ms barato con su seguro), pero el sitio web puede darle el precio si no utiliz ningn seguro.  - Puede imprimir el cupn correspondiente y llevarlo con su receta a la farmacia.  - Tambin puede pasar por nuestra oficina durante el horario de atencin regular y recoger una tarjeta de cupones de GoodRx.  - Si necesita que   su receta se enve electrnicamente a una farmacia diferente, informe a nuestra oficina a travs de MyChart de [place] o por telfono llamando al [phone] y presione la opcin 4.  

## 2023-02-03 ENCOUNTER — Encounter: Payer: Self-pay | Admitting: Nurse Practitioner

## 2023-02-03 ENCOUNTER — Ambulatory Visit (INDEPENDENT_AMBULATORY_CARE_PROVIDER_SITE_OTHER): Payer: Managed Care, Other (non HMO) | Admitting: Nurse Practitioner

## 2023-02-03 VITALS — BP 116/77 | HR 64 | Ht 64.0 in | Wt 132.1 lb

## 2023-02-03 DIAGNOSIS — B351 Tinea unguium: Secondary | ICD-10-CM | POA: Diagnosis not present

## 2023-02-03 MED ORDER — CICLOPIROX 8 % EX SOLN: Freq: Every day | CUTANEOUS | 0 refills | Status: DC

## 2023-02-03 NOTE — Progress Notes (Signed)
Established patient visit   Patient: Natasha Mueller   DOB: 07/24/1966   57 y.o. Female  MRN: [id number] Visit Date: 02/03/2023  Chief Complaint  Patient presents with   Nail Problem   Subjective    HPI  Fungal infection to the great toenail  -has been present for several months. Gradually growing out, but as it grows out, the nail seems like it is starting to fall off.  -non tender  -has tried to use multiple over the counter medications which have not helped.   Medications: Outpatient Medications Prior to Visit  Medication Sig   clobetasol (TEMOVATE) 0.05 % external solution PATIENT TO MIX IN 1 JAR OF CERAVE CREAM AND APPLY ALL OVER TWICE DAILY UNTIL ITCH IMPROVED. AVOID FACE.   estradiol (ESTRACE) 0.5 MG tablet Take 1 tablet (0.5 mg total) by mouth daily.   progesterone (PROMETRIUM) 100 MG capsule Take 1 capsule (100 mg total) by mouth daily.   sertraline (ZOLOFT) 50 MG tablet Take 1.5 tablets (75 mg total) by mouth daily.   [DISCONTINUED] Upadacitinib ER (RINVOQ) 15 MG TB24 Take 1 tablet (15 mg total) by mouth daily.   No facility-administered medications prior to visit.    Review of Systems   Objective     Today's Vitals   02/03/23 1116  BP: 116/77  Pulse: 64  SpO2: 100%  Weight: 132 lb 1.9 oz (59.9 kg)  Height: 5\' 4"  (1.626 m)   Body mass index is 22.68 kg/m.   Physical Exam Vitals and nursing note reviewed.  Constitutional:      Appearance: Normal appearance. She is well-developed.  HENT:     Head: Normocephalic and atraumatic.     Nose: Nose normal.     Mouth/Throat:     Mouth: Mucous membranes are moist.     Pharynx: Oropharynx is clear.  Eyes:     Extraocular Movements: Extraocular movements intact.     Conjunctiva/sclera: Conjunctivae normal.     Pupils: Pupils are equal, round, and reactive to light.  Neck:     Vascular: No carotid bruit.  Cardiovascular:     Rate and Rhythm: Normal rate and regular rhythm.     Pulses: Normal pulses.     Heart  sounds: Normal heart sounds.  Pulmonary:     Effort: Pulmonary effort is normal.     Breath sounds: Normal breath sounds.  Abdominal:     Palpations: Abdomen is soft.  Musculoskeletal:        General: Normal range of motion.     Cervical back: Normal range of motion and neck supple.       Feet:  Feet:     Comments: Great toenail is coming loose from surrounding nail bed.  Nail thick and yellow.  Skin and nailbed are intact.  Non tender  Lymphadenopathy:     Cervical: No cervical adenopathy.  Skin:    General: Skin is warm and dry.     Capillary Refill: Capillary refill takes less than 2 seconds.  Neurological:     General: No focal deficit present.     Mental Status: She is alert and oriented to person, place, and time.  Psychiatric:        Mood and Affect: Mood normal.        Behavior: Behavior normal.        Thought Content: Thought content normal.        Judgment: Judgment normal.      Assessment & Plan    Toenail  fungus Assessment & Plan: Recommend patient use penlac solution. Apply every evening to effected area for 7 days. Reassess and repeat treatment as indicated.   Orders: -     Ciclopirox; Apply topically at bedtime. Apply over nail and surrounding skin. Apply daily over previous coat. After seven (7) days, may remove with alcohol and continue cycle.  Dispense: 6.6 mL; Refill: 0    Return for prn worsening or persistent symptoms.        Carlean Jews, NP  [facility] [phone] (phone) [phone] (fax)  [facility]

## 2023-02-17 ENCOUNTER — Other Ambulatory Visit: Payer: Self-pay | Admitting: Gastroenterology

## 2023-02-17 DIAGNOSIS — K5100 Ulcerative (chronic) pancolitis without complications: Secondary | ICD-10-CM

## 2023-02-19 ENCOUNTER — Ambulatory Visit (INDEPENDENT_AMBULATORY_CARE_PROVIDER_SITE_OTHER): Payer: Managed Care, Other (non HMO) | Admitting: Gastroenterology

## 2023-02-19 ENCOUNTER — Encounter: Payer: Self-pay | Admitting: Gastroenterology

## 2023-02-19 VITALS — BP 134/80 | HR 67 | Temp 98.4°F | Ht 64.0 in | Wt 133.2 lb

## 2023-02-19 DIAGNOSIS — K5100 Ulcerative (chronic) pancolitis without complications: Secondary | ICD-10-CM

## 2023-02-19 NOTE — Progress Notes (Signed)
[name], MD [address]  [address]  [place], [place] [address]  Main: [phone]  Fax: [phone]    Gastroenterology Consultation  Referring Provider:     Carlean Jews, NP Primary Care Physician:  Carlean Jews, NP Primary Gastroenterologist:  Dr. [name] Reason for Consultation:     Ulcerative pancolitis        HPI:   Natasha Mueller is a 57 y.o. female referred by Carlean Jews, NP  for consultation & management of UC   Follow-up visit 11/27/2022 Patient reports that her ulcerative colitis is in remission on Rinvoq 30 mg daily.  She denies having any GI symptoms.  However, for last 2 months, she has been noticing rash on her face and neck.  Review of labs also revealed mild leukopenia, elevated total cholesterol.  I have advised patient to start statin after discussing with her PCP which she did not yet.  She has an appointment to see dermatologist on 12/30/2022  Follow-up visit 02/15/2023 Ms. Deike is here for follow-up of ulcerative colitis.  She is currently on Rinvoq 15 mg daily since last visit because of elevated lipids, leukopenia and rash.  Since dose reduction from 30 mg to 50 mg, her leukopenia has resolved, rash resolved.  Still has elevated lipids, patient stated that she did not undergo fasting lipids.  Underwent colonoscopy which revealed inactive colitis only.  She does not have any GI symptoms today  IBD diagnosis: Ulcerative colitis diagnosed in 2006  Disease course:Ulcerative colitis diagnosed in 2006, previously on 5-ASA compounds without any response, switched to 6-MP which patient was taking intermittently.  Flare up in 12/2018 requiring hospital admission at [facility], infectious etiology was ruled out, colonoscopy revealed mild to moderate continuous inflammation, severe in the right colon compared to left colon, advanced to transverse colon only due to severity of inflammation.  She responded well to IV Solu-Medrol followed by  prednisone.  Initiated on Entyvio in 03/2019 with standard induction and initial maintenance dose was every 6 weeks, colonoscopy on 08/27/2019 revealed quiescent colitis, 6-MP has been discontinued.  Entyvio switched to every 8 weeks in 11/2020.  Patient developed breakthrough symptoms when switched to 8 weeks from 6 weeks.  Fecal calprotectin levels were normal.  I have recommended repeat colonoscopy which she did not undergo at that time.  Patient was also experiencing difficult IV access during infusions.  She was diagnosed with atopic dermatitis and started on Dupixent in 03/2021.  Decision was made to start her on Rinvoq which treats both ulcerative colitis and atopic dermatitis.  Entyvio and Dupixent were discontinued.  Rinvoq was approved on 08/07/2021, patient's breakthrough symptoms of UC have resolved on induction dose.  Been switched to maintenance dose of 15 mg, she was experiencing recurrence of symptoms.  Therefore increased in the maintenance dose to 30 mg in January 2023.  This has resulted in rash on her face and neck, leukopenia and elevated lipids.  Therefore, dose is reduced back to 15 mg daily.  Colonoscopy in 2/24 was unremarkable, pathology revealed inactive colitis only  Extra intestinal manifestations: None   IBD surgical history: None   Imaging:   MRE none CTE none CT abdomen and pelvis with contrast on 01/10/2019 1. Irregular thickening of the wall of the left transverse colon with mild associated inflammation, consistent with active inflammatory bowel disease. No abscess or extraluminal/free air. No evidence of bowel obstruction. Associated mild mesenteric adenopathy. 2. No other acute abnormality within  the abdomen or pelvis. SBFT none   Procedures:   Colonoscopy -- 12/13/04: CSY - congested, erythematous, friable, granular, hemorrhagic, and ulcerated mucosa in rectum, sigmoid, splenic flexure, transverse, hepatic flexure, and ascending colon (path: active colitis with  crypt abscess formation c/w IBD). -- 03/07/05: CSY - normal TI and cecum, 6-8 mm hyperplastic in transverse colon. -- 01/17/10: CSY - sigmoid/descending/ascending diverticulosis, pseudopolyps in splenic flexure, internal hemorrhoids (path: colonic mucosa w/o pathologic abnormalities). 11/10/13: CSY - normal TI, 3 diminutive polyps at hepatic flexure (path: focal glandular hyperplasia and lymphoid aggregates), sigmoid diverticulosis, small internal hemorrhoids (random bx path: colonic mucosa focal glandular hyperplasia and lymphoid aggregates);  01/15/2019 - Diffuse moderate inflammation was found in the transverse colon secondary to colitis. Biopsied. - Diffuse mild inflammation was found in the descending colon secondary to colitis. - The sigmoid colon is normal. Biopsied. - Diffuse mild inflammation was found in the rectum secondary to colitis. Biopsied. - Diverticulosis in the sigmoid colon, in the descending colon and in the transverse colon. DIAGNOSIS:  A. COLON, TRANSVERSE; COLD BIOPSY:  - CHRONIC COLITIS WITH SEVERE ACTIVITY (CRYPT DESTRUCTION, CRYPT  ABSCESSES, AND CRYPTITIS).  - NEGATIVE FOR GRANULOMA, DYSPLASIA, AND MALIGNANCY.   B. COLON, DESCENDING; COLD BIOPSY:  - CHRONIC COLITIS WITH MODERATE ACTIVITY (CRYPTITIS AND CRYPT  ABSCESSES).  - NEGATIVE FOR GRANULOMA, DYSPLASIA, AND MALIGNANCY.   C. COLON, SIGMOID; COLD BIOPSY:  - CHRONIC COLITIS WITHOUT ACTIVITY.  - NEGATIVE FOR GRANULOMA, DYSPLASIA, AND MALIGNANCY.   D. RECTUM; COLD BIOPSY:  - CHRONIC PROCTITIS WITH OUT ACTIVITY.  - NEGATIVE FOR GRANULOMA, DYSPLASIA, AND MALIGNANCY.   Colonoscopy 12/18/2022 Adequate bowel prep Normal terminal ileum Mayo score 0 in the entire colon, biopsies performed Normal retroflexion  DIAGNOSIS: A. COLON, CECUM; COLD BIOPSY: - MILD CHRONIC COLITIS WITHOUT ACTIVITY. - NEGATIVE FOR GRANULOMA, DYSPLASIA, AND MALIGNANCY.  B. COLON, DESCENDING; COLD BIOPSY: - MILD CHRONIC COLITIS WITHOUT  ACTIVITY. - NEGATIVE FOR GRANULOMA, DYSPLASIA, AND MALIGNANCY.  C. COLON, TRANSVERSE; COLD BIOPSY: - MILD CHRONIC COLITIS WITHOUT ACTIVITY. - NEGATIVE FOR GRANULOMA, DYSPLASIA, AND MALIGNANCY.  D. COLON, DESCENDING; COLD BIOPSY: - MILD CHRONIC COLITIS WITHOUT ACTIVITY. - NEGATIVE FOR GRANULOMA, DYSPLASIA, AND MALIGNANCY.  E. COLON, SIGMOID; COLD BIOPSY: - MILD CHRONIC COLITIS WITHOUT ACTIVITY. - NEGATIVE FOR GRANULOMA, DYSPLASIA, AND MALIGNANCY.  F. RECTUM; COLD BIOPSY: - BENIGN RECTAL MUCOSA WITH MILD CRYPT ARCHITECTURAL ALTERATIONS. - NEGATIVE FOR ACTIVE MUCOSAL PROCTITIS, GRANULOMA, DYSPLASIA, AND MALIGNANCY.    Upper Endoscopy 11/10/13: EGD - normal esophagus, single fundic gland polyp, normal duodenum.  - The examined portion of the ileum was normal. - Normal mucosa in the entire examined colon. Chromoscopy performed. Biopsied. - Two small polyps in the ascending colon, removed with a cold snare. Resected and retrieved. - Diverticulosis in the sigmoid colon and in the descending colon. - Pseudopolyps in the descending colon. - The distal rectum and anal verge are normal on retroflexion view. - Inactive (Mayo Score 0) ulcerative colitis, in remission, improved since the last examination.  DIAGNOSIS:  A. COLON, RIGHT, RANDOM; BIOPSIES:  - NEGATIVE FOR ACTIVE MUCOSAL COLITIS, DYSPLASIA AND MALIGNANCY.   B. COLON POLYPS X2, ASCENDING; BIOPSY:  - MINUTE TUBULAR ADENOMA AND A FRAGMENT OF POLYPOID BENIGN COLONIC  MUCOSA.  - NEGATIVE FOR HIGH-GRADE DYSPLASIA AND MALIGNANCY.   C. COLON, LEFT, RANDOM; BIOPSY:  - NEGATIVE FOR ACTIVE MUCOSAL COLITIS, DYSPLASIA AND MALIGNANCY.  - FEW REACTIVE LYMPHOID AGGREGATES.  - FEW FRAGMENTS WITH MUCOSAL HEMORRHAGE.   Past Medical History:  Diagnosis Date   Allergy  BRCA negative 2013   with BART   Family history of breast cancer    BRCA/Bart neg 2013; IBIS=15%   Squamous cell carcinoma of skin 10/31/2009   Left superior  clavicle/medial shoulder   UC (ulcerative colitis)     Past Surgical History:  Procedure Laterality Date   CESAREAN SECTION     COLONOSCOPY WITH PROPOFOL N/A 01/15/2019   Procedure: COLONOSCOPY WITH PROPOFOL;  Surgeon: Midge Minium, MD;  Location: [facility] ENDOSCOPY;  Service: Endoscopy;  Laterality: N/A;   COLONOSCOPY WITH PROPOFOL N/A 08/27/2019   Procedure: COLONOSCOPY WITH PROPOFOL;  Surgeon: Toney Reil, MD;  Location: [facility] ENDOSCOPY;  Service: Gastroenterology;  Laterality: N/A;   COLONOSCOPY WITH PROPOFOL N/A 12/18/2022   Procedure: COLONOSCOPY WITH PROPOFOL;  Surgeon: Toney Reil, MD;  Location: [facility] ENDOSCOPY;  Service: Gastroenterology;  Laterality: N/A;    Current Outpatient Medications:    ciclopirox (PENLAC) 8 % solution, Apply topically at bedtime. Apply over nail and surrounding skin. Apply daily over previous coat. After seven (7) days, may remove with alcohol and continue cycle., Disp: 6.6 mL, Rfl: 0   clobetasol (TEMOVATE) 0.05 % external solution, PATIENT TO MIX IN 1 JAR OF CERAVE CREAM AND APPLY ALL OVER TWICE DAILY UNTIL ITCH IMPROVED. AVOID FACE., Disp: 50 mL, Rfl: 0   estradiol (ESTRACE) 0.5 MG tablet, Take 1 tablet (0.5 mg total) by mouth daily., Disp: 90 tablet, Rfl: 1   progesterone (PROMETRIUM) 100 MG capsule, Take 1 capsule (100 mg total) by mouth daily., Disp: 90 capsule, Rfl: 1   RINVOQ 15 MG TB24, TAKE 1 TABLET DAILY, Disp: 30 tablet, Rfl: 2   sertraline (ZOLOFT) 50 MG tablet, Take 1.5 tablets (75 mg total) by mouth daily., Disp: 135 tablet, Rfl: 1    Family History  Problem Relation Age of Onset   Breast cancer Mother 64   Hypertension Mother    Atrial fibrillation Father    Bladder Cancer Father 32   Diabetes Sister    Breast cancer Sister    Atrial fibrillation Brother      Social History   Tobacco Use   Smoking status: Never   Smokeless tobacco: Never  Vaping Use   Vaping Use: Never used  Substance Use Topics   Alcohol use: No    Drug use: No    Allergies as of 02/19/2023 - Review Complete 02/19/2023  Allergen Reaction Noted   Hyoscyamine sulfate Swelling 03/13/2015    Review of Systems:    All systems reviewed and negative except where noted in HPI.   Physical Exam:  BP 134/80 (BP Location: Left Arm, Patient Position: Sitting, Cuff Size: Normal)   Pulse 67   Temp 98.4 F (36.9 C) (Oral)   Ht 5\' 4"  (1.626 m)   Wt 133 lb 4 oz (60.4 kg)   LMP 06/14/2016 (Exact Date)   BMI 22.87 kg/m  Patient's last menstrual period was 06/14/2016 (exact date).  General:   Alert,  Well-developed, well-nourished, pleasant and cooperative in NAD Head:  Normocephalic and atraumatic. Eyes:  Sclera clear, no icterus.   Conjunctiva pink. Ears:  Normal auditory acuity. Nose:  No deformity, discharge, or lesions. Mouth:  No deformity or lesions,oropharynx pink & moist. Neck:  Supple; no masses or thyromegaly. Lungs:  Respirations even and unlabored.  Clear throughout to auscultation.   No wheezes, crackles, or rhonchi. No acute distress. Heart:  Regular rate and rhythm; no murmurs, clicks, rubs, or gallops. Abdomen:  Normal bowel sounds. Soft, non-tender and non-distended without masses,  hepatosplenomegaly or hernias noted.  No guarding or rebound tenderness.   Rectal: Not performed Msk:  Symmetrical without gross deformities. Good, equal movement & strength bilaterally. Pulses:  Normal pulses noted. Extremities:  No clubbing or edema.  No cyanosis. Neurologic:  Alert and oriented x3;  grossly normal neurologically. Skin:  Intact without significant lesions or rashes. No jaundice. Psych:  Alert and cooperative. Normal mood and affect.  Imaging Studies: Reviewed  Assessment and Plan:   ONNIE ALATORRE is a 57 y.o. female with history of ulcerative pancolitis since 2006, moderately severe, previously maintained on 6-MP, colonoscopy in 12/2018 confirmed exacerbation of ulcerative colitis responsive to prednisone, maintained  on Entyvio from 03/2019 to 06/2021.switch to 2 Rinvoq to treat both ulcerative colitis and atopic dermatitis in 07/2021, currently in clinical remission.   Ulcerative pancolitis: In histologic remission based on the colonoscopy in 11/2022, currently on Rinvoq 15 mg daily.  With high cholesterol, leukopenia and rash, recommend to decrease the maintenance dose to 15 mg daily because I was concerned about the side effects from higher maintenance dose.  The symptoms have resolved Recheck CBC, LFTs and fasting lipids  IBD Health Maintenance   1.TB status: QuantiFERON gold negative on 01/15/2019 2. Anemia: No evidence of anemia, normal iron and B12 stores 3.Immunizations: Hep A and B  received second dose, Influenza recommend annually, prevnar administered in 03/2019, pneumovax administered in 05/2019, Varicella unknown, Shingrix vaccine received first dose in 03/2019, second dose in 05/2019 4.Cancer screening I) Colon cancer/dysplasia surveillance: No evidence of dysplasia to date II) Cervical cancer: Annual Pap smear up to date, reportedly negative III) Skin cancer - counseled about annual skin exam by dermatology and skin protection in summer using sun screen SPF > 50, clothing.  She has history of squamous cell carcinoma of the skin about 5 to 6 years ago.  Seen by dermatologist within last 1 year.  Recommend her to have annual surveillance of her skin by dermatologist.  Currently treated for atopic dermatitis with Dupixent 5.Bone health Vitamin D status:  vitamin D levels normal Bone density testing: Not done 5. Labs: Every 3 months 6. Smoking: None 7. NSAIDs and Antibiotics use: None  Hyperlipidemia Patient is trying to follow healthy diet and trying to lose weight due to recent weight gain Will recheck fasting lipids, if not improving, recommend to initiate statin and follow-up with PCP   Follow up in 6 months   [name], MD

## 2023-02-19 NOTE — Patient Instructions (Signed)
our lab is open on Mondays 1pm to 4pm, Tuesdays 8:30am to 4pm. Wednesdays 8:30am to 4pm and thursdays 1pm to 4pm.   

## 2023-03-10 DIAGNOSIS — B351 Tinea unguium: Secondary | ICD-10-CM | POA: Insufficient documentation

## 2023-03-10 NOTE — Assessment & Plan Note (Addendum)
Recommend patient use penlac solution. Apply every evening to effected area for 7 days. Reassess and repeat treatment as indicated.

## 2023-05-02 ENCOUNTER — Ambulatory Visit: Payer: Managed Care, Other (non HMO) | Admitting: Nurse Practitioner

## 2023-05-09 ENCOUNTER — Other Ambulatory Visit: Payer: Self-pay | Admitting: Nurse Practitioner

## 2023-05-09 DIAGNOSIS — F419 Anxiety disorder, unspecified: Secondary | ICD-10-CM

## 2023-05-09 DIAGNOSIS — N951 Menopausal and female climacteric states: Secondary | ICD-10-CM

## 2023-05-09 DIAGNOSIS — Z79890 Hormone replacement therapy (postmenopausal): Secondary | ICD-10-CM

## 2023-06-24 ENCOUNTER — Other Ambulatory Visit: Payer: Self-pay | Admitting: Gastroenterology

## 2023-06-24 DIAGNOSIS — K5100 Ulcerative (chronic) pancolitis without complications: Secondary | ICD-10-CM

## 2023-06-24 NOTE — Telephone Encounter (Signed)
Has appointment on 08/20/2023  Last office visit 02/19/2023

## 2023-07-01 ENCOUNTER — Telehealth: Payer: Self-pay

## 2023-07-01 NOTE — Telephone Encounter (Signed)
Submitted PA through cover my meds for Rinvoq 15mg . Waiting on response from insurance company

## 2023-07-22 ENCOUNTER — Other Ambulatory Visit: Payer: Self-pay | Admitting: Nurse Practitioner

## 2023-07-22 DIAGNOSIS — N951 Menopausal and female climacteric states: Secondary | ICD-10-CM

## 2023-07-22 DIAGNOSIS — Z79890 Hormone replacement therapy (postmenopausal): Secondary | ICD-10-CM

## 2023-07-22 DIAGNOSIS — F419 Anxiety disorder, unspecified: Secondary | ICD-10-CM

## 2023-07-24 ENCOUNTER — Ambulatory Visit: Payer: Managed Care, Other (non HMO) | Admitting: Family Medicine

## 2023-07-27 DIAGNOSIS — E782 Mixed hyperlipidemia: Secondary | ICD-10-CM | POA: Insufficient documentation

## 2023-07-27 NOTE — Patient Instructions (Signed)

## 2023-07-30 ENCOUNTER — Ambulatory Visit (INDEPENDENT_AMBULATORY_CARE_PROVIDER_SITE_OTHER): Payer: Managed Care, Other (non HMO) | Admitting: Nurse Practitioner

## 2023-07-30 ENCOUNTER — Encounter: Payer: Self-pay | Admitting: Nurse Practitioner

## 2023-07-30 VITALS — BP 124/78 | HR 76 | Temp 97.6°F | Resp 12 | Ht 63.0 in | Wt 133.6 lb

## 2023-07-30 DIAGNOSIS — Z2821 Immunization not carried out because of patient refusal: Secondary | ICD-10-CM

## 2023-07-30 DIAGNOSIS — E782 Mixed hyperlipidemia: Secondary | ICD-10-CM

## 2023-07-30 DIAGNOSIS — Z7689 Persons encountering health services in other specified circumstances: Secondary | ICD-10-CM

## 2023-07-30 DIAGNOSIS — K5100 Ulcerative (chronic) pancolitis without complications: Secondary | ICD-10-CM | POA: Diagnosis not present

## 2023-07-30 DIAGNOSIS — F411 Generalized anxiety disorder: Secondary | ICD-10-CM | POA: Diagnosis not present

## 2023-07-30 DIAGNOSIS — N951 Menopausal and female climacteric states: Secondary | ICD-10-CM

## 2023-07-30 DIAGNOSIS — Z79890 Hormone replacement therapy (postmenopausal): Secondary | ICD-10-CM

## 2023-07-30 MED ORDER — ESTRADIOL 0.5 MG PO TABS: 0.5000 mg | ORAL_TABLET | Freq: Every day | ORAL | 4 refills | Status: DC

## 2023-07-30 MED ORDER — SERTRALINE HCL 50 MG PO TABS: 75.0000 mg | ORAL_TABLET | Freq: Every day | ORAL | 4 refills | Status: DC

## 2023-07-30 MED ORDER — PROGESTERONE MICRONIZED 100 MG PO CAPS: 100.0000 mg | ORAL_CAPSULE | Freq: Every day | ORAL | 4 refills | Status: DC

## 2023-07-30 NOTE — Assessment & Plan Note (Signed)
Chronic, stable.  Denies SI/HI.  Continue Sertraline at 75 MG and adjust as needed.  Refills sent in.

## 2023-07-30 NOTE — Assessment & Plan Note (Signed)
Refer to vasomotor symptoms plan of care.

## 2023-07-30 NOTE — Assessment & Plan Note (Signed)
Chronic, ongoing.  She follows with gastroenterology, will continue this collaboration.  Recent note reviewed.  Continue Rinvoq as ordered by them.   No recent flares.

## 2023-07-30 NOTE — Assessment & Plan Note (Addendum)
Ongoing, noted past labs.  Would benefit fasting labs at physical due to elevation in LDL noted past labs.  Concern for familial HLD.  Plan on repeat labs in January and may need to initiate medication dependent on findings.  Focus on diet and regular exercise at this time.

## 2023-07-30 NOTE — Assessment & Plan Note (Signed)
Ongoing, she has been on HRT for 2 years.  Will continue this for now due to symptoms.  Night sweats and hot flashes.  Attempt reduction in upcoming years.  Refills sent in.

## 2023-07-30 NOTE — Progress Notes (Signed)
New Patient Office Visit  Subjective    Patient ID: Natasha Mueller, female    DOB: 25-Jul-1966  Age: 57 y.o. MRN: [id number]  CC:  Chief Complaint  Patient presents with   Establish Care    HPI Natasha Mueller presents for new patient visit to establish care.  Introduced to Publishing rights manager role and practice setting.  All questions answered.  Discussed provider/patient relationship and expectations. Her previous provider went to a different area.    She takes Progesterone and Estrogen for menopause to help control hot flashes and night sweats.  Has been on hormone therapy for a couple years.  Ordered by previous PCP.    ULCERATIVE COLITIS Diagnosed over 15 years ago.  Is currently taking Rinvoq, takes for UC and eczema.  Last saw Dr. Allegra Lai, GI, on 02/19/23.  Has not had a flare in 2-3 years. Fever: no Nausea: no Vomiting: no Weight loss: no Decreased appetite: no Diarrhea: no Constipation:  sometimes Blood in stool: no Heartburn: no Jaundice: no  ANXIETY/STRESS Has been on Sertraline 75 MG for many years to control anxiety and keep UC flares down. Duration:stable Anxious mood: occasional  Excessive worrying: no Irritability: no  Sweating: no Nausea: no Palpitations:no Hyperventilation: no Panic attacks: no Agoraphobia: no  Obscessions/compulsions: no Depressed mood: no    08-27-23    2:18 PM 10/31/2022   10:44 AM 09/10/2021   11:37 AM 12/21/2019    2:26 PM 10/19/2018    2:16 PM  Depression screen PHQ 2/9  Decreased Interest 0 0 0 2 0  Down, Depressed, Hopeless 0 0 0 0 0  PHQ - 2 Score 0 0 0 2 0  Altered sleeping 2 0 2 1 2   Tired, decreased energy 0 0 1 3 0  Change in appetite 0 0 0 0 0  Feeling bad or failure about yourself  0 0 0 0 0  Trouble concentrating 0 0 0 2 1  Moving slowly or fidgety/restless 0 0 0 0 0  Suicidal thoughts 0 0 0 0 0  PHQ-9 Score 2 0 3 8 3   Difficult doing work/chores Not difficult at all   Somewhat difficult Not difficult at all   Anhedonia: no Weight changes: no Insomnia: yes hard to stay asleep  Hypersomnia: no Fatigue/loss of energy: no Feelings of worthlessness: no Feelings of guilt: no Impaired concentration/indecisiveness: no Suicidal ideations: no  Crying spells: no Recent Stressors/Life Changes: no   Relationship problems: no   Family stress: no     Financial stress: no    Job stress: no    Recent death/loss: no     08-27-23    2:19 PM 10/31/2022   10:44 AM 09/10/2021   11:37 AM 12/21/2019    2:18 PM  GAD 7 : Generalized Anxiety Score  Nervous, Anxious, on Edge 0 0 0 0  Control/stop worrying 0 1 0 0  Worry too much - different things 0 1 0 1  Trouble relaxing 0 0 0 0  Restless 0 0 0 0  Easily annoyed or irritable 0 1 0 1  Afraid - awful might happen 0 0 0 0  Total GAD 7 Score 0 3 0 2  Anxiety Difficulty    Somewhat difficult    Outpatient Encounter Medications as of 08/27/2023  Medication Sig   clobetasol (TEMOVATE) 0.05 % external solution PATIENT TO MIX IN 1 JAR OF CERAVE CREAM AND APPLY ALL OVER TWICE DAILY UNTIL ITCH IMPROVED. AVOID FACE.   Upadacitinib  ER (RINVOQ) 15 MG TB24 TAKE 1 TABLET DAILY   [DISCONTINUED] estradiol (ESTRACE) 0.5 MG tablet Take 1 tablet (0.5 mg total) by mouth daily.   [DISCONTINUED] progesterone (PROMETRIUM) 100 MG capsule Take 1 capsule (100 mg total) by mouth daily.   [DISCONTINUED] sertraline (ZOLOFT) 50 MG tablet Take 1.5 tablets (75 mg total) by mouth daily.   estradiol (ESTRACE) 0.5 MG tablet Take 1 tablet (0.5 mg total) by mouth daily.   progesterone (PROMETRIUM) 100 MG capsule Take 1 capsule (100 mg total) by mouth daily.   sertraline (ZOLOFT) 50 MG tablet Take 1.5 tablets (75 mg total) by mouth daily.   [DISCONTINUED] ciclopirox (PENLAC) 8 % solution Apply topically at bedtime. Apply over nail and surrounding skin. Apply daily over previous coat. After seven (7) days, may remove with alcohol and continue cycle. (Patient not taking: Reported on 07/30/2023)    No facility-administered encounter medications on file as of 07/30/2023.    Past Medical History:  Diagnosis Date   Allergy    BRCA negative 2013   with BART   Family history of breast cancer    BRCA/Bart neg 2013; IBIS=15%   Squamous cell carcinoma of skin 10/31/2009   Left superior clavicle/medial shoulder   UC (ulcerative colitis) (HCC)     Past Surgical History:  Procedure Laterality Date   CESAREAN SECTION     COLONOSCOPY WITH PROPOFOL N/A 01/15/2019   Procedure: COLONOSCOPY WITH PROPOFOL;  Surgeon: Midge Minium, MD;  Location: [facility] ENDOSCOPY;  Service: Endoscopy;  Laterality: N/A;   COLONOSCOPY WITH PROPOFOL N/A 08/27/2019   Procedure: COLONOSCOPY WITH PROPOFOL;  Surgeon: Toney Reil, MD;  Location: [facility] ENDOSCOPY;  Service: Gastroenterology;  Laterality: N/A;   COLONOSCOPY WITH PROPOFOL N/A 12/18/2022   Procedure: COLONOSCOPY WITH PROPOFOL;  Surgeon: Toney Reil, MD;  Location: [facility] ENDOSCOPY;  Service: Gastroenterology;  Laterality: N/A;    Family History  Problem Relation Age of Onset   Breast cancer Mother 80   Hypertension Mother    Atrial fibrillation Father    Bladder Cancer Father 32   Heart failure Father    Diabetes Sister    Breast cancer Sister    Atrial fibrillation Sister    Atrial fibrillation Brother     Social History   Socioeconomic History   Marital status: Divorced    Spouse name: Not on file   Number of children: Not on file   Years of education: Not on file   Highest education level: Not on file  Occupational History   Not on file  Tobacco Use   Smoking status: Never   Smokeless tobacco: Never  Vaping Use   Vaping status: Never Used  Substance and Sexual Activity   Alcohol use: No   Drug use: No   Sexual activity: Not Currently    Birth control/protection: Post-menopausal  Other Topics Concern   Not on file  Social History Narrative   Not on file   Social Determinants of Health   Financial Resource Strain:  Low Risk  (07/30/2023)   Overall Financial Resource Strain (CARDIA)    Difficulty of Paying Living Expenses: Not hard at all  Food Insecurity: No Food Insecurity (07/30/2023)   Hunger Vital Sign    Worried About Running Out of Food in the Last Year: Never true    Ran Out of Food in the Last Year: Never true  Transportation Needs: No Transportation Needs (07/30/2023)   PRAPARE - Administrator, Civil Service (Medical): No  Lack of Transportation (Non-Medical): No  Physical Activity: Insufficiently Active (07/30/2023)   Exercise Vital Sign    Days of Exercise per Week: 3 days    Minutes of Exercise per Session: 30 min  Stress: No Stress Concern Present (07/30/2023)   [employer] of Occupational Health - Occupational Stress Questionnaire    Feeling of Stress : Only a little  Social Connections: Socially Isolated (07/30/2023)   Social Connection and Isolation Panel [NHANES]    Frequency of Communication with Friends and Family: Three times a week    Frequency of Social Gatherings with Friends and Family: Three times a week    Attends Religious Services: Never    Active Member of Clubs or Organizations: No    Attends Banker Meetings: Never    Marital Status: Divorced  Catering manager Violence: Not At Risk (07/30/2023)   Humiliation, Afraid, Rape, and Kick questionnaire    Fear of Current or Ex-Partner: No    Emotionally Abused: No    Physically Abused: No    Sexually Abused: No    Review of Systems  Constitutional:  Negative for chills, diaphoresis, fever and weight loss.  Respiratory:  Negative for cough, shortness of breath and wheezing.   Cardiovascular:  Negative for chest pain, palpitations, orthopnea and leg swelling.  Gastrointestinal: Negative.   Neurological: Negative.   Psychiatric/Behavioral:  Negative for depression, memory loss and suicidal ideas. The patient is nervous/anxious and has insomnia.        Objective    BP 124/78 (BP  Location: Left Arm, Patient Position: Sitting, Cuff Size: Normal)   Pulse 76   Temp 97.6 F (36.4 C) (Oral)   Resp 12   Ht 5\' 3"  (1.6 m) Comment: measured  Wt 133 lb 9.6 oz (60.6 kg)   LMP 06/14/2016 (Exact Date)   SpO2 100%   BMI 23.67 kg/m   Physical Exam Vitals and nursing note reviewed.  Constitutional:      General: She is awake. She is not in acute distress.    Appearance: She is well-developed and well-groomed. She is not ill-appearing or toxic-appearing.  HENT:     Head: Normocephalic.     Right Ear: Hearing and external ear normal.     Left Ear: Hearing and external ear normal.  Eyes:     General: Lids are normal.        Right eye: No discharge.        Left eye: No discharge.     Conjunctiva/sclera: Conjunctivae normal.     Pupils: Pupils are equal, round, and reactive to light.  Neck:     Thyroid: No thyromegaly.     Vascular: No carotid bruit.  Cardiovascular:     Rate and Rhythm: Normal rate and regular rhythm.     Heart sounds: Normal heart sounds. No murmur heard.    No gallop.  Pulmonary:     Effort: Pulmonary effort is normal. No accessory muscle usage or respiratory distress.     Breath sounds: Normal breath sounds.  Abdominal:     General: Bowel sounds are normal. There is no distension.     Palpations: Abdomen is soft.     Tenderness: There is no abdominal tenderness.  Musculoskeletal:     Cervical back: Normal range of motion and neck supple.     Right lower leg: No edema.     Left lower leg: No edema.  Lymphadenopathy:     Cervical: No cervical adenopathy.  Skin:  General: Skin is warm and dry.  Neurological:     Mental Status: She is alert and oriented to person, place, and time.     Deep Tendon Reflexes: Reflexes are normal and symmetric.     Reflex Scores:      Brachioradialis reflexes are 2+ on the right side and 2+ on the left side.      Patellar reflexes are 2+ on the right side and 2+ on the left side. Psychiatric:        Attention  and Perception: Attention normal.        Mood and Affect: Mood normal.        Speech: Speech normal.        Behavior: Behavior normal. Behavior is cooperative.        Thought Content: Thought content normal.    Last CBC Lab Results  Component Value Date   WBC 5.1 12/26/2022   HGB 14.0 12/26/2022   HCT 41.5 12/26/2022   MCV 93 12/26/2022   MCH 31.3 12/26/2022   RDW 12.9 12/26/2022   PLT 357 12/26/2022   Last metabolic panel Lab Results  Component Value Date   GLUCOSE 86 10/31/2022   NA 141 10/31/2022   K 5.0 10/31/2022   CL 103 10/31/2022   CO2 22 10/31/2022   BUN 11 10/31/2022   CREATININE 0.76 10/31/2022   EGFR 91 10/31/2022   CALCIUM 9.7 10/31/2022   PHOS 2.9 01/10/2019   PROT 7.4 12/26/2022   ALBUMIN 4.8 12/26/2022   LABGLOB 2.9 10/31/2022   AGRATIO 1.6 10/31/2022   BILITOT <0.2 12/26/2022   ALKPHOS 55 12/26/2022   AST 30 12/26/2022   ALT 24 12/26/2022   ANIONGAP 8 01/17/2019   Last lipids Lab Results  Component Value Date   CHOL 351 (H) 12/26/2022   HDL 71 12/26/2022   LDLCALC 238 (H) 12/26/2022   TRIG 212 (H) 12/26/2022   CHOLHDL 4.9 (H) 12/26/2022   Last hemoglobin A1c Lab Results  Component Value Date   HGBA1C 5.5 10/31/2022   Last thyroid functions Lab Results  Component Value Date   TSH 0.819 10/31/2022   Last vitamin D Lab Results  Component Value Date   VD25OH 33.3 10/31/2022      Assessment & Plan:   Problem List Items Addressed This Visit       Digestive   Ulcerative pancolitis without complication (HCC) - Primary    Chronic, ongoing.  She follows with gastroenterology, will continue this collaboration.  Recent note reviewed.  Continue Rinvoq as ordered by them.   No recent flares.          Other   Generalized anxiety disorder    Chronic, stable.  Denies SI/HI.  Continue Sertraline at 75 MG and adjust as needed.  Refills sent in.        Relevant Medications   sertraline (ZOLOFT) 50 MG tablet   Hormone replacement  therapy    Refer to vasomotor symptoms plan of care.      Relevant Medications   progesterone (PROMETRIUM) 100 MG capsule   estradiol (ESTRACE) 0.5 MG tablet   Hyperlipidemia, mixed    Ongoing, noted past labs.  Would benefit fasting labs at physical due to elevation in LDL noted past labs.  Concern for familial HLD.  Plan on repeat labs in January and may need to initiate medication dependent on findings.  Focus on diet and regular exercise at this time.      Vasomotor symptoms due to menopause  Ongoing, she has been on HRT for 2 years.  Will continue this for now due to symptoms.  Night sweats and hot flashes.  Attempt reduction in upcoming years.  Refills sent in.      Relevant Medications   progesterone (PROMETRIUM) 100 MG capsule   estradiol (ESTRACE) 0.5 MG tablet   Other Visit Diagnoses     Flu vaccine refused       Refuses flu vaccine, education offered.   Encounter to establish care       New patient to office, introduced to provider and practice setting.       Return in about 3 months (around 11/03/2023) for Annual Exam.   Marjie Skiff, NP

## 2023-08-13 ENCOUNTER — Telehealth: Payer: Self-pay

## 2023-08-13 NOTE — Telephone Encounter (Signed)
Patient canceled appointment through [facility] per message we received:  Appointment Information:     Visit Type: Office Visit         Date: 08/20/2023                 Dept: [facility]                 Provider: Lannette Donath                 Time: 1:00 PM                 Length: 15 min   Appt Status: Canceled       Cancel Reason: Conflict/Need to Reschedule Patient comments: Will call to reschedule

## 2023-08-19 ENCOUNTER — Ambulatory Visit (INDEPENDENT_AMBULATORY_CARE_PROVIDER_SITE_OTHER): Payer: Managed Care, Other (non HMO) | Admitting: Dermatology

## 2023-08-19 DIAGNOSIS — L814 Other melanin hyperpigmentation: Secondary | ICD-10-CM | POA: Diagnosis not present

## 2023-08-19 DIAGNOSIS — L820 Inflamed seborrheic keratosis: Secondary | ICD-10-CM

## 2023-08-19 DIAGNOSIS — L821 Other seborrheic keratosis: Secondary | ICD-10-CM

## 2023-08-19 NOTE — Progress Notes (Signed)
   Follow-Up Visit   Subjective  Natasha Mueller is a 57 y.o. female who presents for the following: legs, chest, right side of face The patient has spots, moles and lesions to be evaluated, some may be new or changing and the patient may have concern these could be cancer.   The following portions of the chart were reviewed this encounter and updated as appropriate: medications, allergies, medical history  Review of Systems:  No other skin or systemic complaints except as noted in HPI or Assessment and Plan.  Objective  Well appearing patient in no apparent distress; mood and affect are within normal limits.  All findings within normal limits unless otherwise noted below.  A focused examination was performed of the following areas: Arms, legs, face  Relevant exam findings are noted in the Assessment and Plan.  right medial cheek x 1, left chest x 1, right anterior thigh x 2, right medial knee x 1, right lower calf x 1, left posterior ankle x 1, left upper knee x 2 (4) Erythematous stuck-on, waxy papule     Assessment & Plan     Inflamed seborrheic keratosis (4) right medial cheek x 1, left chest x 1, right anterior thigh x 2, right medial knee x 1, right lower calf x 1, left posterior ankle x 1, left upper knee x 2  Symptomatic, irritating, patient would like treated.  Vs wart at right anterior thigh  Destruction of lesion - right medial cheek x 1, left chest x 1, right anterior thigh x 2, right medial knee x 1, right lower calf x 1, left posterior ankle x 1, left upper knee x 2 (4)  Destruction method: cryotherapy   Informed consent: discussed and consent obtained   Lesion destroyed using liquid nitrogen: Yes   Region frozen until ice ball extended beyond lesion: Yes   Outcome: patient tolerated procedure well with no complications   Post-procedure details: wound care instructions given   Additional details:  Prior to procedure, discussed risks of blister formation,  small wound, skin dyspigmentation, or rare scar following cryotherapy. Recommend Vaseline ointment to treated areas while healing.   Related Medications clobetasol (TEMOVATE) 0.05 % external solution PATIENT TO MIX IN 1 JAR OF CERAVE CREAM AND APPLY ALL OVER TWICE DAILY UNTIL ITCH IMPROVED. AVOID FACE.  SEBORRHEIC KERATOSIS - Stuck-on, waxy, tan-brown papules and/or plaques  - Benign-appearing - Discussed benign etiology and prognosis. - Observe - Call for any changes  LENTIGINES Exam: scattered tan macules Due to sun exposure Treatment Plan: Benign-appearing, observe. Recommend daily broad spectrum sunscreen SPF 30+ to sun-exposed areas, reapply every 2 hours as needed.  Call for any changes     Return if symptoms worsen or fail to improve.  I, Asher Muir, CMA, am acting as scribe for Willeen Niece, MD.   Documentation: I have reviewed the above documentation for accuracy and completeness, and I agree with the above.  Willeen Niece, MD

## 2023-08-19 NOTE — Patient Instructions (Addendum)

## 2023-08-20 ENCOUNTER — Ambulatory Visit: Payer: Managed Care, Other (non HMO) | Admitting: Gastroenterology

## 2023-10-31 NOTE — Patient Instructions (Addendum)
 Voltaren gel over the counter for discomfort.  Be Involved in Caring For Your Health:  Taking Medications When medications are taken as directed, they can greatly improve your health. But if they are not taken as prescribed, they may not work. In some cases, not taking them correctly can be harmful. To help ensure your treatment remains effective and safe, understand your medications and how to take them. Bring your medications to each visit for review by your provider.  Your lab results, notes, and after visit summary will be available on My Chart. We strongly encourage you to use this feature. If lab results are abnormal the clinic will contact you with the appropriate steps. If the clinic does not contact you assume the results are satisfactory. You can always view your results on My Chart. If you have questions regarding your health or results, please contact the clinic during office hours. You can also ask questions on My Chart.  We at [facility] are grateful that you chose us  to provide your care. We strive to provide evidence-based and compassionate care and are always looking for feedback. If you get a survey from the clinic please complete this so we can hear your opinions.  Healthy Eating, Adult Healthy eating may help you get and keep a healthy body weight, reduce the risk of chronic disease, and live a long and productive life. It is important to follow a healthy eating pattern. Your nutritional and calorie needs should be met mainly by different nutrient-rich foods. What are tips for following this plan? Reading food labels Read labels and choose the following: Reduced or low sodium products. Juices with 100% fruit juice. Foods with low saturated fats (<3 g per serving) and high polyunsaturated and monounsaturated fats. Foods with whole grains, such as whole wheat, cracked wheat, brown rice, and wild rice. Whole grains that are fortified with folic acid . This is  recommended for females who are pregnant or who want to become pregnant. Read labels and do not eat or drink the following: Foods or drinks with added sugars. These include foods that contain brown sugar, corn sweetener, corn syrup, dextrose, fructose, glucose, high-fructose corn syrup, honey, invert sugar, lactose, malt syrup, maltose, molasses, raw sugar, sucrose, trehalose, or turbinado sugar. Limit your intake of added sugars to less than 10% of your total daily calories. Do not eat more than the following amounts of added sugar per day: 6 teaspoons (25 g) for females. 9 teaspoons (38 g) for males. Foods that contain processed or refined starches and grains. Refined grain products, such as white flour, degermed cornmeal, white bread, and white rice. Shopping Choose nutrient-rich snacks, such as vegetables, whole fruits, and nuts. Avoid high-calorie and high-sugar snacks, such as potato chips, fruit snacks, and candy. Use oil-based dressings and spreads on foods instead of solid fats such as butter, margarine, sour cream, or cream cheese. Limit pre-made sauces, mixes, and instant products such as flavored rice, instant noodles, and ready-made pasta. Try more plant-protein sources, such as tofu, tempeh, black beans, edamame, lentils, nuts, and seeds. Explore eating plans such as the Mediterranean diet or vegetarian diet. Try heart-healthy dips made with beans and healthy fats like hummus and guacamole. Vegetables go great with these. Cooking Use oil to saut or stir-fry foods instead of solid fats such as butter, margarine, or lard. Try baking, boiling, grilling, or broiling instead of frying. Remove the fatty part of meats before cooking. Steam vegetables in water or broth. Meal planning  At meals,  imagine dividing your plate into fourths: One-half of your plate is fruits and vegetables. One-fourth of your plate is whole grains. One-fourth of your plate is protein, especially lean  meats, poultry, eggs, tofu, beans, or nuts. Include low-fat dairy as part of your daily diet. Lifestyle Choose healthy options in all settings, including home, work, school, restaurants, or stores. Prepare your food safely: Wash your hands after handling raw meats. Where you prepare food, keep surfaces clean by regularly washing with hot, soapy water. Keep raw meats separate from ready-to-eat foods, such as fruits and vegetables. Cook seafood, meat, poultry, and eggs to the recommended temperature. Get a food thermometer. Store foods at safe temperatures. In general: Keep cold foods at 29F (4.4C) or below. Keep hot foods at 129F (60C) or above. Keep your freezer at [facility] (-17.8C) or below. Foods are not safe to eat if they have been between the temperatures of 40-129F (4.4-60C) for more than 2 hours. What foods should I eat? Fruits Aim to eat 1-2 cups of fresh, canned (in natural juice), or frozen fruits each day. One cup of fruit equals 1 small apple, 1 large banana, 8 large strawberries, 1 cup (237 g) canned fruit,  cup (82 g) dried fruit, or 1 cup (240 mL) 100% juice. Vegetables Aim to eat 2-4 cups of fresh and frozen vegetables each day, including different varieties and colors. One cup of vegetables equals 1 cup (91 g) broccoli or cauliflower florets, 2 medium carrots, 2 cups (150 g) raw, leafy greens, 1 large tomato, 1 large bell pepper, 1 large sweet potato, or 1 medium white potato. Grains Aim to eat 5-10 ounce-equivalents of whole grains each day. Examples of 1 ounce-equivalent of grains include 1 slice of bread, 1 cup (40 g) ready-to-eat cereal, 3 cups (24 g) popcorn, or  cup (93 g) cooked rice. Meats and other proteins Try to eat 5-7 ounce-equivalents of protein each day. Examples of 1 ounce-equivalent of protein include 1 egg,  oz nuts (12 almonds, 24 pistachios, or 7 walnut halves), 1/4 cup (90 g) cooked beans, 6 tablespoons (90 g) hummus or 1 tablespoon (16 g) peanut  butter. A cut of meat or fish that is the size of a deck of cards is about 3-4 ounce-equivalents (85 g). Of the protein you eat each week, try to have at least 8 sounce (227 g) of seafood. This is about 2 servings per week. This includes salmon, trout, herring, sardines, and anchovies. Dairy Aim to eat 3 cup-equivalents of fat-free or low-fat dairy each day. Examples of 1 cup-equivalent of dairy include 1 cup (240 mL) milk, 8 ounces (250 g) yogurt, 1 ounces (44 g) natural cheese, or 1 cup (240 mL) fortified soy milk. Fats and oils Aim for about 5 teaspoons (21 g) of fats and oils per day. Choose monounsaturated fats, such as canola and olive oils, mayonnaise made with olive oil or avocado oil, avocados, peanut butter, and most nuts, or polyunsaturated fats, such as sunflower, corn, and soybean oils, walnuts, pine nuts, sesame seeds, sunflower seeds, and flaxseed. Beverages Aim for 6 eight-ounce glasses of water per day. Limit coffee to 3-5 eight-ounce cups per day. Limit caffeinated beverages that have added calories, such as soda and energy drinks. If you drink alcohol: Limit how much you have to: 0-1 drink a day if you are female. 0-2 drinks a day if you are female. Know how much alcohol is in your drink. In the [place] one drink is one 12 oz bottle of beer (  355 mL), one 5 oz glass of wine (148 mL), or one 1 oz glass of hard liquor (44 mL). Seasoning and other foods Try not to add too much salt to your food. Try using herbs and spices instead of salt. Try not to add sugar to food. This information is based on [place] nutrition guidelines. To learn more, visit disposablenylon.be. Exact amounts may vary. You may need different amounts. This information is not intended to replace advice given to you by your health care provider. Make sure you discuss any questions you have with your health care provider. Document Revised: 07/15/2022 Document Reviewed: 07/15/2022 Elsevier Patient Education  2024  [employer].

## 2023-11-04 ENCOUNTER — Ambulatory Visit (INDEPENDENT_AMBULATORY_CARE_PROVIDER_SITE_OTHER): Payer: Managed Care, Other (non HMO) | Admitting: Nurse Practitioner

## 2023-11-04 ENCOUNTER — Encounter: Payer: Self-pay | Admitting: Nurse Practitioner

## 2023-11-04 VITALS — BP 110/68 | HR 82 | Temp 98.3°F | Ht 63.0 in | Wt 137.4 lb

## 2023-11-04 DIAGNOSIS — N951 Menopausal and female climacteric states: Secondary | ICD-10-CM | POA: Diagnosis not present

## 2023-11-04 DIAGNOSIS — M26609 Unspecified temporomandibular joint disorder, unspecified side: Secondary | ICD-10-CM

## 2023-11-04 DIAGNOSIS — Z1231 Encounter for screening mammogram for malignant neoplasm of breast: Secondary | ICD-10-CM

## 2023-11-04 DIAGNOSIS — E782 Mixed hyperlipidemia: Secondary | ICD-10-CM | POA: Diagnosis not present

## 2023-11-04 DIAGNOSIS — Z79890 Hormone replacement therapy (postmenopausal): Secondary | ICD-10-CM

## 2023-11-04 DIAGNOSIS — Z0000 Encounter for general adult medical examination without abnormal findings: Secondary | ICD-10-CM | POA: Diagnosis not present

## 2023-11-04 DIAGNOSIS — L729 Follicular cyst of the skin and subcutaneous tissue, unspecified: Secondary | ICD-10-CM

## 2023-11-04 DIAGNOSIS — F411 Generalized anxiety disorder: Secondary | ICD-10-CM

## 2023-11-04 DIAGNOSIS — K5100 Ulcerative (chronic) pancolitis without complications: Secondary | ICD-10-CM

## 2023-11-04 NOTE — Assessment & Plan Note (Signed)
 To anterior left occiput.  Overall stable with no s/s infection.  Monitor closely and if any changes consider referral to general surgery or dermatology for removal.

## 2023-11-04 NOTE — Assessment & Plan Note (Signed)
Chronic, ongoing.  She follows with gastroenterology, will continue this collaboration.  Recent note reviewed.  Continue Rinvoq as ordered by them.   No recent flares.

## 2023-11-04 NOTE — Assessment & Plan Note (Signed)
 Ongoing, she has been on HRT for >2 years.  Will continue this for now due to symptoms.  Night sweats and hot flashes.  Attempt reduction in upcoming years.  Refills up to date.  Intact uterus = estrogen and progesterone.

## 2023-11-04 NOTE — Assessment & Plan Note (Signed)
Refer to vasomotor symptoms plan of care.

## 2023-11-04 NOTE — Progress Notes (Signed)
 BP 110/68   Pulse 82   Temp 98.3 F (36.8 C) (Oral)   Ht 5' 3 (1.6 m)   Wt 137 lb 6.4 oz (62.3 kg)   LMP 06/14/2016 (Exact Date)   SpO2 98%   BMI 24.34 kg/m    Subjective:    Patient ID: Natasha Mueller, female    DOB: 06-15-1966, 58 y.o.   MRN: [id number]  HPI: Natasha Mueller is a 58 y.o. female presenting on November 11, 2023 for comprehensive medical examination. Current medical complaints include:none  She currently lives with: mother Menopausal Symptoms: yes -- takes Estrace  and Prometrium  for this  Having some TMJ issues since had tooth pulled last year, has issues before but this made it worse.  Left side.  Does not chew gum.    ULCERATIVE COLITIS Diagnosed >15 years ago.  Is currently taking Rinvoq , takes for UC and eczema.  Last saw Dr. Unk, GI, on 02/19/23 -- has visits annually.  No flare in 2-3 years, around 2020. Fever: no Nausea: no Vomiting: no Weight loss: no Decreased appetite: no Diarrhea: no Constipation: sometimes Blood in stool: no Heartburn: no Jaundice: no  ANXIETY/STRESS Been on Sertraline  75 MG for many years to control anxiety and keep UC flares down. Duration:stable Anxious mood: no Excessive worrying: no Irritability: no  Sweating: no Nausea: no Palpitations:no Hyperventilation: no Panic attacks: no Agoraphobia: no  Obscessions/compulsions: no Depressed mood: no    2023-11-11    1:46 PM 07/30/2023    2:18 PM 10/31/2022   10:44 AM 09/10/2021   11:37 AM 12/21/2019    2:26 PM  Depression screen PHQ 2/9  Decreased Interest 0 0 0 0 2  Down, Depressed, Hopeless 0 0 0 0 0  PHQ - 2 Score 0 0 0 0 2  Altered sleeping 0 2 0 2 1  Tired, decreased energy 0 0 0 1 3  Change in appetite 0 0 0 0 0  Feeling bad or failure about yourself  0 0 0 0 0  Trouble concentrating 0 0 0 0 2  Moving slowly or fidgety/restless 0 0 0 0 0  Suicidal thoughts 0 0 0 0 0  PHQ-9 Score 0 2 0 3 8  Difficult doing work/chores Not difficult at all Not difficult at all    Somewhat difficult   Anhedonia: no Weight changes: no Insomnia: no Hypersomnia: no Fatigue/loss of energy: no Feelings of worthlessness: no Feelings of guilt: no Impaired concentration/indecisiveness: no Suicidal ideations: no  Crying spells: no Recent Stressors/Life Changes: no   Relationship problems: no   Family stress: no     Financial stress: no    Job stress: no    Recent death/loss: no     Nov 11, 2023    1:46 PM 07/30/2023    2:19 PM 10/31/2022   10:44 AM 09/10/2021   11:37 AM  GAD 7 : Generalized Anxiety Score  Nervous, Anxious, on Edge 0 0 0 0  Control/stop worrying 0 0 1 0  Worry too much - different things 0 0 1 0  Trouble relaxing 0 0 0 0  Restless 0 0 0 0  Easily annoyed or irritable 0 0 1 0  Afraid - awful might happen 0 0 0 0  Total GAD 7 Score 0 0 3 0  Anxiety Difficulty Not difficult at all           01/16/2019   10:02 PM 12/21/2019    2:26 PM 09/10/2021   11:37 AM 07/30/2023  2:17 PM 11/04/2023    1:44 PM  Fall Risk  Falls in the past year?  0 0 0 0  Was there an injury with Fall?  0 0  0  Fall Risk Category Calculator  0 0  0  Fall Risk Category (Retired)  Low Low    (RETIRED) Patient Fall Risk Level Moderate fall risk  Low fall risk    Patient at Risk for Falls Due to  No Fall Risks  No Fall Risks No Fall Risks  Fall risk Follow up  Falls evaluation completed Falls evaluation completed Falls prevention discussed Falls evaluation completed    Functional Status Survey: Is the patient deaf or have difficulty hearing?: No Does the patient have difficulty seeing, even when wearing glasses/contacts?: No Does the patient have difficulty concentrating, remembering, or making decisions?: No Does the patient have difficulty walking or climbing stairs?: No Does the patient have difficulty dressing or bathing?: No Does the patient have difficulty doing errands alone such as visiting a doctor's office or shopping?: No   Past Medical History:  Past Medical  History:  Diagnosis Date   Allergy    BRCA negative 2013   with BART   Family history of breast cancer    BRCA/Bart neg 2013; IBIS=15%   Squamous cell carcinoma of skin 10/31/2009   Left superior clavicle/medial shoulder   UC (ulcerative colitis) (HCC)     Surgical History:  Past Surgical History:  Procedure Laterality Date   CESAREAN SECTION     COLONOSCOPY WITH PROPOFOL  N/A 01/15/2019   Procedure: COLONOSCOPY WITH PROPOFOL ;  Surgeon: Jinny Carmine, MD;  Location: [facility] ENDOSCOPY;  Service: Endoscopy;  Laterality: N/A;   COLONOSCOPY WITH PROPOFOL  N/A 08/27/2019   Procedure: COLONOSCOPY WITH PROPOFOL ;  Surgeon: Unk Corinn Skiff, MD;  Location: [facility] ENDOSCOPY;  Service: Gastroenterology;  Laterality: N/A;   COLONOSCOPY WITH PROPOFOL  N/A 12/18/2022   Procedure: COLONOSCOPY WITH PROPOFOL ;  Surgeon: Unk Corinn Skiff, MD;  Location: [facility] ENDOSCOPY;  Service: Gastroenterology;  Laterality: N/A;    Medications:  Current Outpatient Medications on File Prior to Visit  Medication Sig   clobetasol  (TEMOVATE ) 0.05 % external solution PATIENT TO MIX IN 1 JAR OF CERAVE CREAM AND APPLY ALL OVER TWICE DAILY UNTIL ITCH IMPROVED. AVOID FACE.   estradiol  (ESTRACE ) 0.5 MG tablet Take 1 tablet (0.5 mg total) by mouth daily.   progesterone  (PROMETRIUM ) 100 MG capsule Take 1 capsule (100 mg total) by mouth daily.   sertraline  (ZOLOFT ) 50 MG tablet Take 1.5 tablets (75 mg total) by mouth daily.   Upadacitinib  ER (RINVOQ ) 15 MG TB24 TAKE 1 TABLET DAILY   No current facility-administered medications on file prior to visit.    Allergies:  Allergies  Allergen Reactions   Hyoscyamine Sulfate Swelling    Tongue swelling    Social History:  Social History   Socioeconomic History   Marital status: Divorced    Spouse name: Not on file   Number of children: Not on file   Years of education: Not on file   Highest education level: Not on file  Occupational History   Not on file  Tobacco Use    Smoking status: Never   Smokeless tobacco: Never  Vaping Use   Vaping status: Never Used  Substance and Sexual Activity   Alcohol use: No   Drug use: No   Sexual activity: Not Currently    Birth control/protection: Post-menopausal  Other Topics Concern   Not on file  Social History Narrative  Not on file   Social Drivers of Health   Financial Resource Strain: Low Risk  (07/30/2023)   Overall Financial Resource Strain (CARDIA)    Difficulty of Paying Living Expenses: Not hard at all  Food Insecurity: No Food Insecurity (07/30/2023)   Hunger Vital Sign    Worried About Running Out of Food in the Last Year: Never true    Ran Out of Food in the Last Year: Never true  Transportation Needs: No Transportation Needs (07/30/2023)   PRAPARE - Administrator, Civil Service (Medical): No    Lack of Transportation (Non-Medical): No  Physical Activity: Insufficiently Active (07/30/2023)   Exercise Vital Sign    Days of Exercise per Week: 3 days    Minutes of Exercise per Session: 30 min  Stress: No Stress Concern Present (07/30/2023)   [employer] of Occupational Health - Occupational Stress Questionnaire    Feeling of Stress : Only a little  Social Connections: Socially Isolated (07/30/2023)   Social Connection and Isolation Panel [NHANES]    Frequency of Communication with Friends and Family: Three times a week    Frequency of Social Gatherings with Friends and Family: Three times a week    Attends Religious Services: Never    Active Member of Clubs or Organizations: No    Attends Banker Meetings: Never    Marital Status: Divorced  Catering Manager Violence: Not At Risk (07/30/2023)   Humiliation, Afraid, Rape, and Kick questionnaire    Fear of Current or Ex-Partner: No    Emotionally Abused: No    Physically Abused: No    Sexually Abused: No   Social History   Tobacco Use  Smoking Status Never  Smokeless Tobacco Never   Social History   Substance  and Sexual Activity  Alcohol Use No    Family History:  Family History  Problem Relation Age of Onset   Breast cancer Mother 18   Hypertension Mother    Atrial fibrillation Father    Bladder Cancer Father 54   Heart failure Father    Diabetes Sister    Breast cancer Sister    Atrial fibrillation Sister    Atrial fibrillation Brother     Past medical history, surgical history, medications, allergies, family history and social history reviewed with patient today and changes made to appropriate areas of the chart.   ROS All other ROS negative except what is listed above and in the HPI.      Objective:    BP 110/68   Pulse 82   Temp 98.3 F (36.8 C) (Oral)   Ht 5' 3 (1.6 m)   Wt 137 lb 6.4 oz (62.3 kg)   LMP 06/14/2016 (Exact Date)   SpO2 98%   BMI 24.34 kg/m   Wt Readings from Last 3 Encounters:  11/04/23 137 lb 6.4 oz (62.3 kg)  07/30/23 133 lb 9.6 oz (60.6 kg)  02/19/23 133 lb 4 oz (60.4 kg)    Physical Exam Vitals and nursing note reviewed. Exam conducted with a chaperone present.  Constitutional:      General: She is awake. She is not in acute distress.    Appearance: She is well-developed and well-groomed. She is not ill-appearing or toxic-appearing.  HENT:     Head: Normocephalic and atraumatic.      Right Ear: Hearing, tympanic membrane, ear canal and external ear normal. No drainage.     Left Ear: Hearing, tympanic membrane, ear canal and external ear normal.  No drainage.     Nose: Nose normal.     Right Sinus: No maxillary sinus tenderness or frontal sinus tenderness.     Left Sinus: No maxillary sinus tenderness or frontal sinus tenderness.     Mouth/Throat:     Mouth: Mucous membranes are moist.     Pharynx: Oropharynx is clear. Uvula midline. No pharyngeal swelling, oropharyngeal exudate or posterior oropharyngeal erythema.  Eyes:     General: Lids are normal.        Right eye: No discharge.        Left eye: No discharge.     Extraocular  Movements: Extraocular movements intact.     Conjunctiva/sclera: Conjunctivae normal.     Pupils: Pupils are equal, round, and reactive to light.     Visual Fields: Right eye visual fields normal and left eye visual fields normal.  Neck:     Thyroid : No thyromegaly.     Vascular: No carotid bruit.     Trachea: Trachea normal.  Cardiovascular:     Rate and Rhythm: Normal rate and regular rhythm.     Heart sounds: Normal heart sounds. No murmur heard.    No gallop.  Pulmonary:     Effort: Pulmonary effort is normal. No accessory muscle usage or respiratory distress.     Breath sounds: Normal breath sounds.  Chest:  Breasts:    Right: Normal.     Left: Normal.  Abdominal:     General: Bowel sounds are normal.     Palpations: Abdomen is soft. There is no hepatomegaly or splenomegaly.     Tenderness: There is no abdominal tenderness.  Musculoskeletal:        General: Normal range of motion.     Cervical back: Normal range of motion and neck supple.     Right lower leg: No edema.     Left lower leg: No edema.  Lymphadenopathy:     Head:     Right side of head: No submental, submandibular, tonsillar, preauricular or posterior auricular adenopathy.     Left side of head: No submental, submandibular, tonsillar, preauricular or posterior auricular adenopathy.     Cervical: No cervical adenopathy.     Upper Body:     Right upper body: No supraclavicular, axillary or pectoral adenopathy.     Left upper body: No supraclavicular, axillary or pectoral adenopathy.  Skin:    General: Skin is warm and dry.     Capillary Refill: Capillary refill takes less than 2 seconds.     Findings: No rash.  Neurological:     Mental Status: She is alert and oriented to person, place, and time.     Gait: Gait is intact.     Deep Tendon Reflexes: Reflexes are normal and symmetric.     Reflex Scores:      Brachioradialis reflexes are 2+ on the right side and 2+ on the left side.      Patellar reflexes are  2+ on the right side and 2+ on the left side. Psychiatric:        Attention and Perception: Attention normal.        Mood and Affect: Mood normal.        Speech: Speech normal.        Behavior: Behavior normal. Behavior is cooperative.        Thought Content: Thought content normal.        Judgment: Judgment normal.     Results for orders placed or  performed during the hospital encounter of 12/18/22  Surgical pathology   Collection Time: 12/18/22  9:49 AM  Result Value Ref Range   SURGICAL PATHOLOGY      SURGICAL PATHOLOGY CASE: [id number] PATIENT: Natasha Mueller Surgical Pathology Report     Specimen Submitted: A. Colon, cecum; cbx B. Colon, ascending; cbx C. Colon, transverse; cbx D. Colon, descending; cbx E. Colon, sigmoid; cbx F. Rectum; cbx  Clinical History: Ulcerative pancolitis without complication HCC K51.00    DIAGNOSIS: A. COLON, CECUM; COLD BIOPSY: - MILD CHRONIC COLITIS WITHOUT ACTIVITY. - NEGATIVE FOR GRANULOMA, DYSPLASIA, AND MALIGNANCY.  B. COLON, DESCENDING; COLD BIOPSY: - MILD CHRONIC COLITIS WITHOUT ACTIVITY. - NEGATIVE FOR GRANULOMA, DYSPLASIA, AND MALIGNANCY.  C. COLON, TRANSVERSE; COLD BIOPSY: - MILD CHRONIC COLITIS WITHOUT ACTIVITY. - NEGATIVE FOR GRANULOMA, DYSPLASIA, AND MALIGNANCY.  D. COLON, DESCENDING; COLD BIOPSY: - MILD CHRONIC COLITIS WITHOUT ACTIVITY. - NEGATIVE FOR GRANULOMA, DYSPLASIA, AND MALIGNANCY.  E. COLON, SIGMOID; COLD BIOPSY: - MILD CHRONIC COLITIS WITHOUT ACTIVITY. - NEGATIVE FOR GRANULOMA, DYSPLASIA, AN D MALIGNANCY.  F. RECTUM; COLD BIOPSY: - BENIGN RECTAL MUCOSA WITH MILD CRYPT ARCHITECTURAL ALTERATIONS. - NEGATIVE FOR ACTIVE MUCOSAL PROCTITIS, GRANULOMA, DYSPLASIA, AND MALIGNANCY.   GROSS DESCRIPTION: A. Labeled: Cbx cecum for history of ulcerative colitis Received: Formalin Collection time: 9:49 AM on 12/18/2022 Placed into formalin time: 9:49 AM on 12/18/2022 Tissue fragment(s): Multiple Size:  Aggregate, 1.0 x 0.2 x 0.1 cm Description: Tan soft tissue fragments Entirely submitted in 1 cassette.  B. Labeled: Cbx ascending colon for history of ulcerative colitis Received: Formalin Collection time: 10:05 AM on 12/18/2022 Placed into formalin time: 10:05 AM on 12/18/2022 Tissue fragment(s): Multiple Size: Aggregate, 0.9 x 0.3 x 0.1 cm Description: Tan soft tissue fragments Entirely submitted in 1 cassette.  C. Labeled: Cbx transverse colon for history of ulcerative colitis Received: In formalin Collection time: 10:08 AM on 12/18/2022 Placed into formalin time : 10:08 AM on 12/18/2022 Tissue fragment(s): Multiple Size: Aggregate, 1.0 x 0.4 x 0.1 cm Description: Tan to pink soft tissue fragments Entirely submitted in 1 cassette.  D. Labeled: Cbx descending colon for history of ulcerative colitis Received: Formalin Collection time: 10:09 AM on 12/18/2022 Placed into formalin time: 10:09 AM on 12/18/2022 Tissue fragment(s): Multiple Size: Aggregate, 1.0 x 0.4 x 0.1 cm Description: Tan soft tissue fragments Entirely submitted in 1 cassette.  E. Labeled: Cbx sigmoid colon for history of ulcerative colitis Received: Formalin Collection time: 10:10 AM on 12/18/2022 Placed into formalin time: 10:10 AM on 12/18/2022 Tissue fragment(s): Multiple Size: Aggregate, 1.1 x 0.2 x 0.1 cm Description: Tan soft tissue fragments Entirely submitted in 1 cassette.  F. Labeled: Cbx rectum for history of ulcerative colitis Received: Formalin Collection time: 10:15 AM on 12/18/2022 Placed into formalin time: 10:15 AM on 12/18/2022 Tissue fragmen t(s): Multiple Size: Aggregate, 1.0 x 0.4 x 0.1 cm Description: Tan to pink soft tissue fragments Entirely submitted in 1 cassette.  CM 12/18/2022  Final Diagnosis performed by Wilkie Molt, MD.   Electronically signed 12/19/2022 9:39:33AM The electronic signature indicates that the named Attending Pathologist has evaluated the specimen Technical  component performed at [facility], [address], [place], [place] [address] Lab: [id number] Dir: Frankey Sas, MD, MMM  Professional component performed at [facility], [address] [place], [place], [place] [address] Lab: [phone] Dir: Wilkie EMERSON Molt, MD       Assessment & Plan:   Problem List Items Addressed This Visit       Digestive   Ulcerative  pancolitis without complication (HCC) - Primary   Chronic, ongoing.  She follows with gastroenterology, will continue this collaboration.  Recent note reviewed.  Continue Rinvoq  as ordered by them.   No recent flares.        Relevant Orders   Comprehensive metabolic panel   CBC with Differential/Platelet   Vitamin B12     Musculoskeletal and Integument   Scalp cyst   To anterior left occiput.  Overall stable with no s/s infection.  Monitor closely and if any changes consider referral to general surgery or dermatology for removal.      TMJ (temporomandibular joint disorder)   Left side, worse since having dental work last year.  Due to UC avoid OTC NSAIDs.  May used OTC Voltaren gel and massage into jaw line, educated her on this and showed her a picture of product.  Recommend looking into PT exercises online to practice at home and recommend she visit dentist to see if product available to better align teeth.  If ongoing or worsening pain then consider imaging and outpatient PT.  Avoid gum or hard to chew items.        Other   Generalized anxiety disorder   Chronic, stable.  Denies SI/HI.  Continue Sertraline  at 75 MG and adjust as needed.  Refills up to date.      Relevant Orders   TSH   Hormone replacement therapy   Refer to vasomotor symptoms plan of care.      Hyperlipidemia, mixed   Ongoing, noted past labs.  Would benefit fasting labs, will obtain in morning.  Concern for familial HLD.  Focus on diet and regular exercise at this time.      Relevant Orders   Comprehensive metabolic panel    Lipid Panel w/o Chol/HDL Ratio   Vasomotor symptoms due to menopause   Ongoing, she has been on HRT for >2 years.  Will continue this for now due to symptoms.  Night sweats and hot flashes.  Attempt reduction in upcoming years.  Refills up to date.  Intact uterus = estrogen and progesterone .      Other Visit Diagnoses       Encounter for screening mammogram for malignant neoplasm of breast       Mammogram ordered and instructed on how to schedule.   Relevant Orders   MM 3D SCREENING MAMMOGRAM BILATERAL BREAST     Encounter for annual physical exam       Annual physical today with labs and health maintenance reviewed, discussed with patient.        Follow up plan: Return in about 6 months (around 05/03/2024) for MOOD -- plus needs lab visit tomorrow morning.   LABORATORY TESTING:  - Pap smear: up to date  IMMUNIZATIONS:   - Tdap: Tetanus vaccination status reviewed: last tetanus booster within 10 years. - Influenza: Refused - Pneumovax: Not applicable - Prevnar: Not applicable - COVID: Refused - HPV: Not applicable - Shingrix  vaccine: Up to date  SCREENING: -Mammogram: Ordered today  - Colonoscopy: Up to date  - Bone Density: Not applicable  -Hearing Test: Not applicable  -Spirometry: Not applicable   PATIENT COUNSELING:   Advised to take 1 mg of folate supplement per day if capable of pregnancy.   Sexuality: Discussed sexually transmitted diseases, partner selection, use of condoms, avoidance of unintended pregnancy  and contraceptive alternatives.   Advised to avoid cigarette smoking.  I discussed with the patient that most people either abstain from alcohol or drink  within safe limits (<=14/week and <=4 drinks/occasion for males, <=7/weeks and <= 3 drinks/occasion for females) and that the risk for alcohol disorders and other health effects rises proportionally with the number of drinks per week and how often a drinker exceeds daily limits.  Discussed  cessation/primary prevention of drug use and availability of treatment for abuse.   Diet: Encouraged to adjust caloric intake to maintain  or achieve ideal body weight, to reduce intake of dietary saturated fat and total fat, to limit sodium intake by avoiding high sodium foods and not adding table salt, and to maintain adequate dietary potassium and calcium preferably from fresh fruits, vegetables, and low-fat dairy products.    Stressed the importance of regular exercise  Injury prevention: Discussed safety belts, safety helmets, smoke detector, smoking near bedding or upholstery.   Dental health: Discussed importance of regular tooth brushing, flossing, and dental visits.    NEXT PREVENTATIVE PHYSICAL DUE IN 1 YEAR. Return in about 6 months (around 05/03/2024) for MOOD -- plus needs lab visit tomorrow morning.

## 2023-11-04 NOTE — Assessment & Plan Note (Signed)
 Left side, worse since having dental work last year.  Due to UC avoid OTC NSAIDs.  May used OTC Voltaren gel and massage into jaw line, educated her on this and showed her a picture of product.  Recommend looking into PT exercises online to practice at home and recommend she visit dentist to see if product available to better align teeth.  If ongoing or worsening pain then consider imaging and outpatient PT.  Avoid gum or hard to chew items.

## 2023-11-04 NOTE — Assessment & Plan Note (Signed)
 Ongoing, noted past labs.  Would benefit fasting labs, will obtain in morning.  Concern for familial HLD.  Focus on diet and regular exercise at this time.

## 2023-11-04 NOTE — Assessment & Plan Note (Signed)
 Chronic, stable.  Denies SI/HI.  Continue Sertraline at 75 MG and adjust as needed.  Refills up to date.

## 2023-11-05 ENCOUNTER — Other Ambulatory Visit: Payer: Managed Care, Other (non HMO)

## 2023-11-05 DIAGNOSIS — F411 Generalized anxiety disorder: Secondary | ICD-10-CM

## 2023-11-05 DIAGNOSIS — K5100 Ulcerative (chronic) pancolitis without complications: Secondary | ICD-10-CM

## 2023-11-05 DIAGNOSIS — E782 Mixed hyperlipidemia: Secondary | ICD-10-CM

## 2023-11-06 NOTE — Progress Notes (Signed)
 Contacted via MyChart   Good afternoon Shaye, your labs have returned: - CBC shows no anemia or infection. - Waiting on B12 level, will let you know if abnormal. - Kidney function, creatinine and eGFR, remains normal, as is liver function, AST and ALT.  - Thyroid  level normal, TSH. - Biggest concern remains Lipid panel which continues to show total cholesterol and LDL level quite elevated, even with fasting.  This can mean there is a familial (genetic) hypercholesterolemia element.  We have some options.  #1 We could send you to the lipid clinic in [place] to see a specialist for further work-up and recommendations.  #2 We could try starting statin therapy and see if levels improve.  #3 We could consider obtaining a CT calcium score via imaging.  This look at amount of calcium in coronary arteries of heart and provides a score to determine your risk for heart disease or heart attack.  However, insurance does not cover this imaging and it can cost $100 to $200 out of pocket.  What are your thoughts? Keep being amazing!!  Thank you for allowing me to participate in your care.  I appreciate you. Kindest regards, Karel Mowers

## 2023-11-07 ENCOUNTER — Encounter: Payer: Self-pay | Admitting: Nurse Practitioner

## 2023-11-07 DIAGNOSIS — E7801 Familial hypercholesterolemia: Secondary | ICD-10-CM

## 2023-11-11 LAB — CBC WITH DIFFERENTIAL/PLATELET
Basophils Absolute: 0.1 10*3/uL (ref 0.0–0.2)
Basos: 3 %
EOS (ABSOLUTE): 0.7 10*3/uL — ABNORMAL HIGH (ref 0.0–0.4)
Eos: 15 %
Hematocrit: 42.6 % (ref 34.0–46.6)
Hemoglobin: 14.1 g/dL (ref 11.1–15.9)
Immature Grans (Abs): 0 10*3/uL (ref 0.0–0.1)
Immature Granulocytes: 0 %
Lymphocytes Absolute: 1.2 10*3/uL (ref 0.7–3.1)
Lymphs: 29 %
MCH: 30.8 pg (ref 26.6–33.0)
MCHC: 33.1 g/dL (ref 31.5–35.7)
MCV: 93 fL (ref 79–97)
Monocytes Absolute: 0.3 10*3/uL (ref 0.1–0.9)
Monocytes: 8 %
Neutrophils Absolute: 1.9 10*3/uL (ref 1.4–7.0)
Neutrophils: 45 %
Platelets: 322 10*3/uL (ref 150–450)
RBC: 4.58 x10E6/uL (ref 3.77–5.28)
RDW: 12.3 % (ref 11.7–15.4)
WBC: 4.2 10*3/uL (ref 3.4–10.8)

## 2023-11-11 LAB — COMPREHENSIVE METABOLIC PANEL
ALT: 14 [IU]/L (ref 0–32)
AST: 22 [IU]/L (ref 0–40)
Albumin: 4.6 g/dL (ref 3.8–4.9)
Alkaline Phosphatase: 63 [IU]/L (ref 44–121)
BUN/Creatinine Ratio: 14 (ref 9–23)
BUN: 11 mg/dL (ref 6–24)
Bilirubin Total: 0.3 mg/dL (ref 0.0–1.2)
CO2: 23 mmol/L (ref 20–29)
Calcium: 9.6 mg/dL (ref 8.7–10.2)
Chloride: 101 mmol/L (ref 96–106)
Creatinine, Ser: 0.79 mg/dL (ref 0.57–1.00)
Globulin, Total: 3.1 g/dL (ref 1.5–4.5)
Glucose: 80 mg/dL (ref 70–99)
Potassium: 4.1 mmol/L (ref 3.5–5.2)
Sodium: 140 mmol/L (ref 134–144)
Total Protein: 7.7 g/dL (ref 6.0–8.5)
eGFR: 86 mL/min/{1.73_m2} (ref >59)

## 2023-11-11 LAB — LIPID PANEL W/O CHOL/HDL RATIO
Cholesterol, Total: 357 mg/dL — ABNORMAL HIGH (ref 100–199)
HDL: 75 mg/dL (ref >39)
LDL Chol Calc (NIH): 257 mg/dL — ABNORMAL HIGH (ref 0–99)
Triglycerides: 137 mg/dL (ref 0–149)
VLDL Cholesterol Cal: 25 mg/dL (ref 5–40)

## 2023-11-11 LAB — VITAMIN B12: Vitamin B-12: 967 pg/mL (ref 232–1245)

## 2023-11-11 LAB — TSH: TSH: 0.92 u[IU]/mL (ref 0.450–4.500)

## 2023-11-17 ENCOUNTER — Ambulatory Visit
Admission: RE | Admit: 2023-11-17 | Discharge: 2023-11-17 | Disposition: A | Discharge status: Home / Self Care | Payer: Self-pay | Source: Ambulatory Visit | Attending: Nurse Practitioner | Admitting: Nurse Practitioner

## 2023-11-17 DIAGNOSIS — E7801 Familial hypercholesterolemia: Secondary | ICD-10-CM | POA: Insufficient documentation

## 2023-11-18 ENCOUNTER — Encounter: Payer: Self-pay | Admitting: Nurse Practitioner

## 2023-11-18 NOTE — Progress Notes (Signed)
Contacted via MyChart   So I have good news.  Your calcium scoring is 0 with recommendation to focus on lifestyle and regular exercise.  We do not need to start medication at this time and I would recommend repeating this imaging in 2 years.  This is why I love this imaging.  Very helpful to see where you are at and your risk.:)

## 2023-12-02 ENCOUNTER — Ambulatory Visit
Admission: RE | Admit: 2023-12-02 | Discharge: 2023-12-02 | Disposition: A | Discharge status: Home / Self Care | Payer: Managed Care, Other (non HMO) | Source: Ambulatory Visit | Attending: Nurse Practitioner | Admitting: Nurse Practitioner

## 2023-12-02 DIAGNOSIS — Z1231 Encounter for screening mammogram for malignant neoplasm of breast: Secondary | ICD-10-CM | POA: Insufficient documentation

## 2023-12-05 ENCOUNTER — Encounter: Payer: Self-pay | Admitting: Nurse Practitioner

## 2023-12-05 NOTE — Progress Notes (Signed)
 Contacted via MyChart   Normal mammogram, may repeat in one year:)

## 2023-12-10 ENCOUNTER — Encounter: Payer: Self-pay | Admitting: Nurse Practitioner

## 2024-01-20 ENCOUNTER — Ambulatory Visit: Payer: Managed Care, Other (non HMO) | Admitting: Nurse Practitioner

## 2024-05-05 ENCOUNTER — Ambulatory Visit: Payer: Self-pay | Admitting: Nurse Practitioner

## 2024-10-19 ENCOUNTER — Other Ambulatory Visit: Payer: Self-pay | Admitting: Nurse Practitioner

## 2024-10-19 DIAGNOSIS — N951 Menopausal and female climacteric states: Secondary | ICD-10-CM

## 2024-10-19 DIAGNOSIS — F411 Generalized anxiety disorder: Secondary | ICD-10-CM

## 2024-10-19 DIAGNOSIS — Z79890 Hormone replacement therapy (postmenopausal): Secondary | ICD-10-CM

## 2024-10-25 NOTE — Telephone Encounter (Signed)
 Scheduled

## 2024-11-03 ENCOUNTER — Encounter: Payer: Self-pay | Admitting: Nurse Practitioner

## 2024-11-13 NOTE — Patient Instructions (Signed)
 Be Involved in Caring For Your Health:  Taking Medications When medications are taken as directed, they can greatly improve your health. But if they are not taken as prescribed, they may not work. In some cases, not taking them correctly can be harmful. To help ensure your treatment remains effective and safe, understand your medications and how to take them. Bring your medications to each visit for review by your provider.  Your lab results, notes, and after visit summary will be available on My Chart. We strongly encourage you to use this feature. If lab results are abnormal the clinic will contact you with the appropriate steps. If the clinic does not contact you assume the results are satisfactory. You can always view your results on My Chart. If you have questions regarding your health or results, please contact the clinic during office hours. You can also ask questions on My Chart.  We at [facility] are grateful that you chose us  to provide your care. We strive to provide evidence-based and compassionate care and are always looking for feedback. If you get a survey from the clinic please complete this so we can hear your opinions.  Healthy Eating, Adult Healthy eating may help you get and keep a healthy body weight, reduce the risk of chronic disease, and live a long and productive life. It is important to follow a healthy eating pattern. Your nutritional and calorie needs should be met mainly by different nutrient-rich foods. What are tips for following this plan? Reading food labels Read labels and choose the following: Reduced or low sodium products. Juices with 100% fruit juice. Foods with low saturated fats (<3 g per serving) and high polyunsaturated and monounsaturated fats. Foods with whole grains, such as whole wheat, cracked wheat, brown rice, and wild rice. Whole grains that are fortified with folic acid. This is recommended for females who are pregnant or who want to  become pregnant. Read labels and do not eat or drink the following: Foods or drinks with added sugars. These include foods that contain brown sugar, corn sweetener, corn syrup, dextrose , fructose, glucose, high-fructose corn syrup, honey, invert sugar, lactose, malt syrup, maltose, molasses, raw sugar, sucrose, trehalose, or turbinado sugar. Limit your intake of added sugars to less than 10% of your total daily calories. Do not eat more than the following amounts of added sugar per day: 6 teaspoons (25 g) for females. 9 teaspoons (38 g) for males. Foods that contain processed or refined starches and grains. Refined grain products, such as white flour, degermed cornmeal, white bread, and white rice. Shopping Choose nutrient-rich snacks, such as vegetables, whole fruits, and nuts. Avoid high-calorie and high-sugar snacks, such as potato chips, fruit snacks, and candy. Use oil-based dressings and spreads on foods instead of solid fats such as butter, margarine, sour cream, or cream cheese. Limit pre-made sauces, mixes, and instant products such as flavored rice, instant noodles, and ready-made pasta. Try more plant-protein sources, such as tofu, tempeh, black beans, edamame, lentils, nuts, and seeds. Explore eating plans such as the Mediterranean diet or vegetarian diet. Try heart-healthy dips made with beans and healthy fats like hummus and guacamole. Vegetables go great with these. Cooking Use oil to saut or stir-fry foods instead of solid fats such as butter, margarine, or lard. Try baking, boiling, grilling, or broiling instead of frying. Remove the fatty part of meats before cooking. Steam vegetables in water  or broth. Meal planning  At meals, imagine dividing your plate into fourths: One-half of  your plate is fruits and vegetables. One-fourth of your plate is whole grains. One-fourth of your plate is protein, especially lean meats, poultry, eggs, tofu, beans, or nuts. Include low-fat  dairy as part of your daily diet. Lifestyle Choose healthy options in all settings, including home, work, school, restaurants, or stores. Prepare your food safely: Wash your hands after handling raw meats. Where you prepare food, keep surfaces clean by regularly washing with hot, soapy water . Keep raw meats separate from ready-to-eat foods, such as fruits and vegetables. Cook seafood, meat, poultry, and eggs to the recommended temperature. Get a food thermometer. Store foods at safe temperatures. In general: Keep cold foods at 84F (4.4C) or below. Keep hot foods at 184F (60C) or above. Keep your freezer at [facility] (-17.8C) or below. Foods are not safe to eat if they have been between the temperatures of 40-184F (4.4-60C) for more than 2 hours. What foods should I eat? Fruits Aim to eat 1-2 cups of fresh, canned (in natural juice), or frozen fruits each day. One cup of fruit equals 1 small apple, 1 large banana, 8 large strawberries, 1 cup (237 g) canned fruit,  cup (82 g) dried fruit, or 1 cup (240 mL) 100% juice. Vegetables Aim to eat 2-4 cups of fresh and frozen vegetables each day, including different varieties and colors. One cup of vegetables equals 1 cup (91 g) broccoli or cauliflower florets, 2 medium carrots, 2 cups (150 g) raw, leafy greens, 1 large tomato, 1 large bell pepper, 1 large sweet potato, or 1 medium white potato. Grains Aim to eat 5-10 ounce-equivalents of whole grains each day. Examples of 1 ounce-equivalent of grains include 1 slice of bread, 1 cup (40 g) ready-to-eat cereal, 3 cups (24 g) popcorn, or  cup (93 g) cooked rice. Meats and other proteins Try to eat 5-7 ounce-equivalents of protein each day. Examples of 1 ounce-equivalent of protein include 1 egg,  oz nuts (12 almonds, 24 pistachios, or 7 walnut halves), 1/4 cup (90 g) cooked beans, 6 tablespoons (90 g) hummus or 1 tablespoon (16 g) peanut butter. A cut of meat or fish that is the size of a deck of  cards is about 3-4 ounce-equivalents (85 g). Of the protein you eat each week, try to have at least 8 sounce (227 g) of seafood. This is about 2 servings per week. This includes salmon, trout, herring, sardines, and anchovies. Dairy Aim to eat 3 cup-equivalents of fat-free or low-fat dairy each day. Examples of 1 cup-equivalent of dairy include 1 cup (240 mL) milk, 8 ounces (250 g) yogurt, 1 ounces (44 g) natural cheese, or 1 cup (240 mL) fortified soy milk. Fats and oils Aim for about 5 teaspoons (21 g) of fats and oils per day. Choose monounsaturated fats, such as canola and olive oils, mayonnaise made with olive oil or avocado oil, avocados, peanut butter, and most nuts, or polyunsaturated fats, such as sunflower, corn, and soybean oils, walnuts, pine nuts, sesame seeds, sunflower seeds, and flaxseed. Beverages Aim for 6 eight-ounce glasses of water  per day. Limit coffee to 3-5 eight-ounce cups per day. Limit caffeinated beverages that have added calories, such as soda and energy drinks. If you drink alcohol: Limit how much you have to: 0-1 drink a day if you are female. 0-2 drinks a day if you are female. Know how much alcohol is in your drink. In the [place] one drink is one 12 oz bottle of beer (355 mL), one 5 oz glass of wine (  148 mL), or one 1 oz glass of hard liquor (44 mL). Seasoning and other foods Try not to add too much salt to your food. Try using herbs and spices instead of salt. Try not to add sugar to food. This information is based on [place] nutrition guidelines. To learn more, visit [employer]. Exact amounts may vary. You may need different amounts. This information is not intended to replace advice given to you by your health care provider. Make sure you discuss any questions you have with your health care provider. Document Revised: 07/15/2022 Document Reviewed: 07/15/2022 Elsevier Patient Education  2024 [employer].

## 2024-11-16 ENCOUNTER — Encounter: Payer: Self-pay | Admitting: Nurse Practitioner

## 2024-11-16 ENCOUNTER — Ambulatory Visit: Admitting: Nurse Practitioner

## 2024-11-16 VITALS — BP 123/78 | HR 61 | Temp 98.6°F | Ht 63.2 in | Wt 134.4 lb

## 2024-11-16 DIAGNOSIS — K5100 Ulcerative (chronic) pancolitis without complications: Secondary | ICD-10-CM

## 2024-11-16 DIAGNOSIS — Z1231 Encounter for screening mammogram for malignant neoplasm of breast: Secondary | ICD-10-CM | POA: Diagnosis not present

## 2024-11-16 DIAGNOSIS — N951 Menopausal and female climacteric states: Secondary | ICD-10-CM | POA: Diagnosis not present

## 2024-11-16 DIAGNOSIS — Z79890 Hormone replacement therapy (postmenopausal): Secondary | ICD-10-CM | POA: Diagnosis not present

## 2024-11-16 DIAGNOSIS — F411 Generalized anxiety disorder: Secondary | ICD-10-CM

## 2024-11-16 DIAGNOSIS — E782 Mixed hyperlipidemia: Secondary | ICD-10-CM

## 2024-11-16 DIAGNOSIS — Z0000 Encounter for general adult medical examination without abnormal findings: Secondary | ICD-10-CM

## 2024-11-16 MED ORDER — SERTRALINE HCL 50 MG PO TABS: 75.0000 mg | ORAL_TABLET | Freq: Every day | ORAL | 3 refills | Status: AC

## 2024-11-16 MED ORDER — ESTRADIOL 0.5 MG PO TABS: 0.5000 mg | ORAL_TABLET | Freq: Every day | ORAL | 3 refills | Status: AC

## 2024-11-16 MED ORDER — PROGESTERONE MICRONIZED 100 MG PO CAPS: 100.0000 mg | ORAL_CAPSULE | Freq: Every day | ORAL | 3 refills | Status: AC

## 2024-11-16 NOTE — Assessment & Plan Note (Signed)
Chronic, ongoing.  She follows with gastroenterology, will continue this collaboration.  Recent note reviewed.  Continue Rinvoq as ordered by them.   No recent flares.

## 2024-11-16 NOTE — Assessment & Plan Note (Signed)
 Ongoing, noted past labs.  Not fasting today.  Concern for familial HLD, but her Coronary calcium score was 0 on 11/17/23. Plan to repeat in a few years.  Focus on diet and regular exercise at this time.

## 2024-11-16 NOTE — Assessment & Plan Note (Signed)
 Chronic, stable.  Denies SI/HI.  Continue Sertraline  at 75 MG and adjust as needed.  Refills sent.

## 2024-11-16 NOTE — Assessment & Plan Note (Signed)
 Ongoing, she has been on HRT for >3 years.  Will continue this for now due to symptoms.  Night sweats and hot flashes.  Attempt reduction in upcoming years.  Refills up to date.  Intact uterus = estrogen and progesterone .

## 2024-11-16 NOTE — Progress Notes (Signed)
 "  BP 123/78 (BP Location: Left Arm, Cuff Size: Normal)   Pulse 61   Temp 98.6 F (37 C) (Oral)   Ht 5' 3.2 (1.605 m)   Wt 134 lb 6.4 oz (61 kg)   LMP 06/14/2016   SpO2 98%   BMI 23.66 kg/m    Subjective:    Patient ID: Natasha Mueller, female    DOB: 10/05/1966, 59 y.o.   MRN: [id number]  HPI: Natasha Mueller is a 59 y.o. female presenting on 11/16/2024 for comprehensive medical examination. Current medical complaints include:none  She currently lives with: family Menopausal Symptoms: no  ULCERATIVE COLITIS Diagnosed >15 years ago.  Currently taking Rinvoq , takes for UC and eczema.  Last saw Dr. Unk, GI, on 02/19/23, to visit with her annually.  No flare in 2-3 years, around 2020 was last one. Fever: no Nausea: no Vomiting: no Weight loss: no Decreased appetite: no Diarrhea: no Constipation: no Blood in stool: no Heartburn: no Jaundice: no   ANXIETY/STRESS Been on Sertraline  75 MG for many years to control anxiety and keep UC flares down. She also continues on hormone replacement for menopausal symptoms, this offers benefit. Duration:stable Anxious mood: no Excessive worrying: no Irritability: no  Sweating: no Nausea: no Palpitations:no Hyperventilation: no Panic attacks: no Agoraphobia: no  Obscessions/compulsions: no Depressed mood: no    11/16/2024    4:25 PM 11/04/2023    1:46 PM 07/30/2023    2:18 PM 10/31/2022   10:44 AM 09/10/2021   11:37 AM  Depression screen PHQ 2/9  Decreased Interest 0 0 0 0 0  Down, Depressed, Hopeless 0 0 0 0 0  PHQ - 2 Score 0 0 0 0 0  Altered sleeping 0 0 2 0 2  Tired, decreased energy 0 0 0 0 1  Change in appetite 0 0 0 0 0  Feeling bad or failure about yourself  0 0 0 0 0  Trouble concentrating 0 0 0 0 0  Moving slowly or fidgety/restless 0 0 0 0 0  Suicidal thoughts 0 0 0 0 0  PHQ-9 Score 0 0  2  0  3   Difficult doing work/chores Not difficult at all Not difficult at all Not difficult at all       Data saved with a  previous flowsheet row definition      11/16/2024    4:26 PM 11/04/2023    1:46 PM 07/30/2023    2:19 PM 10/31/2022   10:44 AM  GAD 7 : Generalized Anxiety Score  Nervous, Anxious, on Edge 0 0  0  0   Control/stop worrying 0 0  0  1   Worry too much - different things 0 0  0  1   Trouble relaxing 0 0  0  0   Restless 0 0  0  0   Easily annoyed or irritable 0 0  0  1   Afraid - awful might happen 0 0  0  0   Total GAD 7 Score 0 0 0 3  Anxiety Difficulty Not difficult at all Not difficult at all       Data saved with a previous flowsheet row definition        12/21/2019    2:26 PM 09/10/2021   11:37 AM 07/30/2023    2:17 PM 11/04/2023    1:44 PM 11/16/2024    4:25 PM  Fall Risk  Falls in the past year? 0  0 0 0 0  Was there an injury with Fall? 0  0   0  0  Fall Risk Category Calculator 0 0  0 0  Fall Risk Category (Retired) Low  Low      (RETIRED) Patient Fall Risk Level  Low fall risk      Patient at Risk for Falls Due to No Fall Risks  No Fall Risks No Fall Risks No Fall Risks  Fall risk Follow up Falls evaluation completed  Falls evaluation completed  Falls prevention discussed Falls evaluation completed Falls evaluation completed     Data saved with a previous flowsheet row definition    Past Medical History:  Past Medical History:  Diagnosis Date   Allergy    Anxiety    BRCA negative 2013   with BART   Family history of breast cancer    BRCA/Bart neg 2013; IBIS=15%   Squamous cell carcinoma of skin 10/31/2009   Left superior clavicle/medial shoulder   UC (ulcerative colitis) (HCC)     Surgical History:  Past Surgical History:  Procedure Laterality Date   CESAREAN SECTION     COLONOSCOPY WITH PROPOFOL  N/A 01/15/2019   Procedure: COLONOSCOPY WITH PROPOFOL ;  Surgeon: Jinny Carmine, MD;  Location: [facility] ENDOSCOPY;  Service: Endoscopy;  Laterality: N/A;   COLONOSCOPY WITH PROPOFOL  N/A 08/27/2019   Procedure: COLONOSCOPY WITH PROPOFOL ;  Surgeon: Unk Corinn Skiff, MD;   Location: [facility] ENDOSCOPY;  Service: Gastroenterology;  Laterality: N/A;   COLONOSCOPY WITH PROPOFOL  N/A 12/18/2022   Procedure: COLONOSCOPY WITH PROPOFOL ;  Surgeon: Unk Corinn Skiff, MD;  Location: [facility] ENDOSCOPY;  Service: Gastroenterology;  Laterality: N/A;   TUBAL LIGATION      Medications:  Current Outpatient Medications on File Prior to Visit  Medication Sig   Upadacitinib  ER (RINVOQ ) 15 MG TB24 TAKE 1 TABLET DAILY   No current facility-administered medications on file prior to visit.    Allergies:  Allergies[1]  Social History:  Social History   Socioeconomic History   Marital status: Single    Spouse name: Not on file   Number of children: Not on file   Years of education: Not on file   Highest education level: Not on file  Occupational History   Not on file  Tobacco Use   Smoking status: Never   Smokeless tobacco: Never  Vaping Use   Vaping status: Never Used  Substance and Sexual Activity   Alcohol use: No   Drug use: No   Sexual activity: Not Currently    Birth control/protection: Post-menopausal  Other Topics Concern   Not on file  Social History Narrative   Not on file   Social Drivers of Health   Tobacco Use: Low Risk (11/16/2024)   Patient History    Smoking Tobacco Use: Never    Smokeless Tobacco Use: Never    Passive Exposure: Not on file  Financial Resource Strain: Low Risk (07/30/2023)   Overall Financial Resource Strain (CARDIA)    Difficulty of Paying Living Expenses: Not hard at all  Food Insecurity: No Food Insecurity (07/30/2023)   Hunger Vital Sign    Worried About Running Out of Food in the Last Year: Never true    Ran Out of Food in the Last Year: Never true  Transportation Needs: No Transportation Needs (07/30/2023)   PRAPARE - Administrator, Civil Service (Medical): No    Lack of Transportation (Non-Medical): No  Physical Activity: Insufficiently Active (07/30/2023)   Exercise Vital Sign    Days of Exercise per  Week:  3 days    Minutes of Exercise per Session: 30 min  Stress: No Stress Concern Present (07/30/2023)   [employer] of Occupational Health - Occupational Stress Questionnaire    Feeling of Stress : Only a little  Social Connections: Socially Isolated (07/30/2023)   Social Connection and Isolation Panel    Frequency of Communication with Friends and Family: Three times a week    Frequency of Social Gatherings with Friends and Family: Three times a week    Attends Religious Services: Never    Active Member of Clubs or Organizations: No    Attends Banker Meetings: Never    Marital Status: Divorced  Catering Manager Violence: Not At Risk (07/30/2023)   Humiliation, Afraid, Rape, and Kick questionnaire    Fear of Current or Ex-Partner: No    Emotionally Abused: No    Physically Abused: No    Sexually Abused: No  Depression (PHQ2-9): Low Risk (11/16/2024)   Depression (PHQ2-9)    PHQ-2 Score: 0  Alcohol Screen: Low Risk (07/30/2023)   Alcohol Screen    Last Alcohol Screening Score (AUDIT): 0  Housing: Low Risk (07/30/2023)   Housing    Last Housing Risk Score: 0  Utilities: Not At Risk (07/30/2023)   AHC Utilities    Threatened with loss of utilities: No  Health Literacy: Adequate Health Literacy (11/04/2023)   B1300 Health Literacy    Frequency of need for help with medical instructions: Never   Tobacco Use History[2] Social History   Substance and Sexual Activity  Alcohol Use No    Family History:  Family History  Problem Relation Age of Onset   Breast cancer Mother 56   Hypertension Mother    Cancer Mother    Atrial fibrillation Father    Bladder Cancer Father 38   Heart failure Father    Diabetes Sister    Breast cancer Sister    Cancer Sister    Atrial fibrillation Sister    Atrial fibrillation Brother    ADD / ADHD Son    Anxiety disorder Son     Past medical history, surgical history, medications, allergies, family history and social history  reviewed with patient today and changes made to appropriate areas of the chart.   ROS All other ROS negative except what is listed above and in the HPI.      Objective:    BP 123/78 (BP Location: Left Arm, Cuff Size: Normal)   Pulse 61   Temp 98.6 F (37 C) (Oral)   Ht 5' 3.2 (1.605 m)   Wt 134 lb 6.4 oz (61 kg)   LMP 06/14/2016   SpO2 98%   BMI 23.66 kg/m   Wt Readings from Last 3 Encounters:  11/16/24 134 lb 6.4 oz (61 kg)  11/04/23 137 lb 6.4 oz (62.3 kg)  07/30/23 133 lb 9.6 oz (60.6 kg)    Physical Exam Vitals and nursing note reviewed.  Constitutional:      General: She is awake. She is not in acute distress.    Appearance: She is well-developed and well-groomed. She is not ill-appearing or toxic-appearing.  HENT:     Head: Normocephalic and atraumatic.     Right Ear: Hearing, tympanic membrane, ear canal and external ear normal. No drainage.     Left Ear: Hearing, tympanic membrane, ear canal and external ear normal. No drainage.     Nose: Nose normal.     Right Sinus: No maxillary sinus tenderness or frontal  sinus tenderness.     Left Sinus: No maxillary sinus tenderness or frontal sinus tenderness.     Mouth/Throat:     Mouth: Mucous membranes are moist.     Pharynx: Oropharynx is clear. Uvula midline. No pharyngeal swelling, oropharyngeal exudate or posterior oropharyngeal erythema.  Eyes:     General: Lids are normal.        Right eye: No discharge.        Left eye: No discharge.     Extraocular Movements: Extraocular movements intact.     Conjunctiva/sclera: Conjunctivae normal.     Pupils: Pupils are equal, round, and reactive to light.     Visual Fields: Right eye visual fields normal and left eye visual fields normal.  Neck:     Thyroid : No thyromegaly.     Vascular: No carotid bruit.     Trachea: Trachea normal.  Cardiovascular:     Rate and Rhythm: Normal rate and regular rhythm.     Heart sounds: Normal heart sounds. No murmur heard.    No  gallop.  Pulmonary:     Effort: Pulmonary effort is normal. No accessory muscle usage or respiratory distress.     Breath sounds: Normal breath sounds.  Chest:     Comments: Deferred per patient request. Abdominal:     General: Bowel sounds are normal.     Palpations: Abdomen is soft. There is no hepatomegaly or splenomegaly.     Tenderness: There is no abdominal tenderness.  Musculoskeletal:        General: Normal range of motion.     Cervical back: Normal range of motion and neck supple.     Right lower leg: No edema.     Left lower leg: No edema.  Lymphadenopathy:     Head:     Right side of head: No submental, submandibular, tonsillar, preauricular or posterior auricular adenopathy.     Left side of head: No submental, submandibular, tonsillar, preauricular or posterior auricular adenopathy.     Cervical: No cervical adenopathy.  Skin:    General: Skin is warm and dry.     Capillary Refill: Capillary refill takes less than 2 seconds.     Findings: No rash.  Neurological:     Mental Status: She is alert and oriented to person, place, and time.     Gait: Gait is intact.     Deep Tendon Reflexes: Reflexes are normal and symmetric.     Reflex Scores:      Brachioradialis reflexes are 2+ on the right side and 2+ on the left side.      Patellar reflexes are 2+ on the right side and 2+ on the left side. Psychiatric:        Attention and Perception: Attention normal.        Mood and Affect: Mood normal.        Speech: Speech normal.        Behavior: Behavior normal. Behavior is cooperative.        Thought Content: Thought content normal.        Judgment: Judgment normal.     Results for orders placed or performed in visit on 11/05/23  Vitamin B12   Collection Time: 11/05/23  8:45 AM  Result Value Ref Range   Vitamin B-12 967 232 - 1,245 pg/mL  TSH   Collection Time: 11/05/23  8:45 AM  Result Value Ref Range   TSH 0.920 0.450 - 4.500 uIU/mL  CBC with Differential/Platelet  Collection Time: 11/05/23  8:45 AM  Result Value Ref Range   WBC 4.2 3.4 - 10.8 x10E3/uL   RBC 4.58 3.77 - 5.28 x10E6/uL   Hemoglobin 14.1 11.1 - 15.9 g/dL   Hematocrit 57.3 65.9 - 46.6 %   MCV 93 79 - 97 fL   MCH 30.8 26.6 - 33.0 pg   MCHC 33.1 31.5 - 35.7 g/dL   RDW 87.6 88.2 - 84.5 %   Platelets 322 150 - 450 x10E3/uL   Neutrophils 45 Not Estab. %   Lymphs 29 Not Estab. %   Monocytes 8 Not Estab. %   Eos 15 Not Estab. %   Basos 3 Not Estab. %   Neutrophils Absolute 1.9 1.4 - 7.0 x10E3/uL   Lymphocytes Absolute 1.2 0.7 - 3.1 x10E3/uL   Monocytes Absolute 0.3 0.1 - 0.9 x10E3/uL   EOS (ABSOLUTE) 0.7 (H) 0.0 - 0.4 x10E3/uL   Basophils Absolute 0.1 0.0 - 0.2 x10E3/uL   Immature Granulocytes 0 Not Estab. %   Immature Grans (Abs) 0.0 0.0 - 0.1 x10E3/uL  Lipid Panel w/o Chol/HDL Ratio   Collection Time: 11/05/23  8:45 AM  Result Value Ref Range   Cholesterol, Total 357 (H) 100 - 199 mg/dL   Triglycerides 862 0 - 149 mg/dL   HDL 75 >60 mg/dL   VLDL Cholesterol Cal 25 5 - 40 mg/dL   LDL Chol Calc (NIH) 742 (H) 0 - 99 mg/dL   LDL CALC COMMENT: Comment   Comprehensive metabolic panel   Collection Time: 11/05/23  8:45 AM  Result Value Ref Range   Glucose 80 70 - 99 mg/dL   BUN 11 6 - 24 mg/dL   Creatinine, Ser 9.20 0.57 - 1.00 mg/dL   eGFR 86 >40 fO/fpw/8.26   BUN/Creatinine Ratio 14 9 - 23   Sodium 140 134 - 144 mmol/L   Potassium 4.1 3.5 - 5.2 mmol/L   Chloride 101 96 - 106 mmol/L   CO2 23 20 - 29 mmol/L   Calcium 9.6 8.7 - 10.2 mg/dL   Total Protein 7.7 6.0 - 8.5 g/dL   Albumin 4.6 3.8 - 4.9 g/dL   Globulin, Total 3.1 1.5 - 4.5 g/dL   Bilirubin Total 0.3 0.0 - 1.2 mg/dL   Alkaline Phosphatase 63 44 - 121 IU/L   AST 22 0 - 40 IU/L   ALT 14 0 - 32 IU/L      Assessment & Plan:   Problem List Items Addressed This Visit       Digestive   Ulcerative pancolitis without complication (HCC) - Primary   Chronic, ongoing.  She follows with gastroenterology, will continue  this collaboration.  Recent note reviewed.  Continue Rinvoq  as ordered by them.   No recent flares.        Relevant Orders   CBC with Differential/Platelet   TSH     Other   Vasomotor symptoms due to menopause   Ongoing, she has been on HRT for >3 years.  Will continue this for now due to symptoms.  Night sweats and hot flashes.  Attempt reduction in upcoming years.  Refills up to date.  Intact uterus = estrogen and progesterone .      Relevant Medications   estradiol  (ESTRACE ) 0.5 MG tablet   progesterone  (PROMETRIUM ) 100 MG capsule   Hyperlipidemia, mixed   Ongoing, noted past labs.  Not fasting today.  Concern for familial HLD, but her Coronary calcium score was 0 on 11/17/23. Plan to repeat in a few  years.  Focus on diet and regular exercise at this time.      Relevant Orders   Comprehensive metabolic panel with GFR   Lipid Panel w/o Chol/HDL Ratio   Hormone replacement therapy   Refer to vasomotor symptoms plan of care.      Relevant Medications   estradiol  (ESTRACE ) 0.5 MG tablet   progesterone  (PROMETRIUM ) 100 MG capsule   Generalized anxiety disorder   Chronic, stable.  Denies SI/HI.  Continue Sertraline  at 75 MG and adjust as needed.  Refills sent.      Relevant Medications   sertraline  (ZOLOFT ) 50 MG tablet   Other Visit Diagnoses       Encounter for screening mammogram for malignant neoplasm of breast       Mammogram ordered and she will schedule.   Relevant Orders   MM 3D SCREENING MAMMOGRAM BILATERAL BREAST     Encounter for annual physical exam       Annual physical today with labs and health maintenance reviewed, discussed with patient. Refuses vaccines.        Follow up plan: Return in about 1 year (around 11/16/2025) for Annual Physical.   LABORATORY TESTING:  - Pap smear: up to date  IMMUNIZATIONS:   - Tdap: Tetanus vaccination status reviewed: last tetanus booster within 10 years. - Influenza: Refused - Pneumovax: Refused - Prevnar:  Refused - COVID: Refused - HPV: Not applicable - Shingrix  vaccine: Refused  SCREENING: -Mammogram: Up to date due February 4th, 2026 - Colonoscopy: Up to date  - Bone Density: Not applicable  -Hearing Test: Not applicable  -Spirometry: Not applicable   PATIENT COUNSELING:   Advised to take 1 mg of folate supplement per day if capable of pregnancy.   Sexuality: Discussed sexually transmitted diseases, partner selection, use of condoms, avoidance of unintended pregnancy  and contraceptive alternatives.   Advised to avoid cigarette smoking.  I discussed with the patient that most people either abstain from alcohol or drink within safe limits (<=14/week and <=4 drinks/occasion for males, <=7/weeks and <= 3 drinks/occasion for females) and that the risk for alcohol disorders and other health effects rises proportionally with the number of drinks per week and how often a drinker exceeds daily limits.  Discussed cessation/primary prevention of drug use and availability of treatment for abuse.   Diet: Encouraged to adjust caloric intake to maintain  or achieve ideal body weight, to reduce intake of dietary saturated fat and total fat, to limit sodium intake by avoiding high sodium foods and not adding table salt, and to maintain adequate dietary potassium and calcium preferably from fresh fruits, vegetables, and low-fat dairy products.    Stressed the importance of regular exercise  Injury prevention: Discussed safety belts, safety helmets, smoke detector, smoking near bedding or upholstery.   Dental health: Discussed importance of regular tooth brushing, flossing, and dental visits.    NEXT PREVENTATIVE PHYSICAL DUE IN 1 YEAR. Return in about 1 year (around 11/16/2025) for Annual Physical.              [1]  Allergies Allergen Reactions   Hyoscyamine Sulfate Swelling    Tongue swelling  [2]  Social History Tobacco Use  Smoking Status Never  Smokeless Tobacco Never   "

## 2024-11-16 NOTE — Assessment & Plan Note (Signed)
Refer to vasomotor symptoms plan of care.

## 2024-11-17 ENCOUNTER — Ambulatory Visit: Payer: Self-pay | Admitting: Nurse Practitioner

## 2024-11-17 LAB — CBC WITH DIFFERENTIAL/PLATELET
Basophils Absolute: 0.1 x10E3/uL (ref 0.0–0.2)
Basos: 2 %
EOS (ABSOLUTE): 0.8 x10E3/uL — ABNORMAL HIGH (ref 0.0–0.4)
Eos: 15 %
Hematocrit: 39.6 % (ref 34.0–46.6)
Hemoglobin: 13.3 g/dL (ref 11.1–15.9)
Immature Grans (Abs): 0 x10E3/uL (ref 0.0–0.1)
Immature Granulocytes: 0 %
Lymphocytes Absolute: 1.9 x10E3/uL (ref 0.7–3.1)
Lymphs: 34 %
MCH: 30.5 pg (ref 26.6–33.0)
MCHC: 33.6 g/dL (ref 31.5–35.7)
MCV: 91 fL (ref 79–97)
Monocytes Absolute: 0.4 x10E3/uL (ref 0.1–0.9)
Monocytes: 8 %
Neutrophils Absolute: 2.3 x10E3/uL (ref 1.4–7.0)
Neutrophils: 41 %
Platelets: 282 x10E3/uL (ref 150–450)
RBC: 4.36 x10E6/uL (ref 3.77–5.28)
RDW: 12.6 % (ref 11.7–15.4)
WBC: 5.5 x10E3/uL (ref 3.4–10.8)

## 2024-11-17 LAB — TSH: TSH: 1.57 u[IU]/mL (ref 0.450–4.500)

## 2024-11-17 LAB — COMPREHENSIVE METABOLIC PANEL WITH GFR
ALT: 15 IU/L (ref 0–32)
AST: 18 IU/L (ref 0–40)
Albumin: 4.1 g/dL (ref 3.8–4.9)
Alkaline Phosphatase: 94 IU/L (ref 49–135)
BUN/Creatinine Ratio: 16 (ref 12–28)
BUN: 13 mg/dL (ref 8–27)
Bilirubin Total: <0.2 mg/dL (ref 0.0–1.2)
CO2: 23 mmol/L (ref 20–29)
Calcium: 9 mg/dL (ref 8.7–10.3)
Chloride: 103 mmol/L (ref 96–106)
Creatinine, Ser: 0.82 mg/dL (ref 0.57–1.00)
Globulin, Total: 3 g/dL (ref 1.5–4.5)
Glucose: 120 mg/dL — ABNORMAL HIGH (ref 70–99)
Potassium: 3.6 mmol/L (ref 3.5–5.2)
Sodium: 140 mmol/L (ref 134–144)
Total Protein: 7.1 g/dL (ref 6.0–8.5)
eGFR: 82 mL/min/1.73

## 2024-11-17 LAB — LIPID PANEL W/O CHOL/HDL RATIO
Cholesterol, Total: 271 mg/dL — ABNORMAL HIGH (ref 100–199)
HDL: 62 mg/dL
LDL Chol Calc (NIH): 175 mg/dL — ABNORMAL HIGH (ref 0–99)
Triglycerides: 184 mg/dL — ABNORMAL HIGH (ref 0–149)
VLDL Cholesterol Cal: 34 mg/dL (ref 5–40)

## 2024-11-17 NOTE — Progress Notes (Signed)
 Contacted via MyChart  Good morning Natasha Mueller, your labs have returned and are overall stable with exception of lipid panel continuing to show elevations, but we know your score on CT was 0 so we can continue to monitor and work on diet and exercise. Any questions? Keep being amazing!!  Thank you for allowing me to participate in your care.  I appreciate you. Kindest regards, Alisabeth Selkirk

## 2024-11-19 ENCOUNTER — Ambulatory Visit: Admitting: Nurse Practitioner

## 2024-12-03 ENCOUNTER — Ambulatory Visit: Admission: RE | Admit: 2024-12-03 | Source: Ambulatory Visit

## 2024-12-03 DIAGNOSIS — Z1231 Encounter for screening mammogram for malignant neoplasm of breast: Secondary | ICD-10-CM

## 2025-11-17 ENCOUNTER — Encounter: Admitting: Nurse Practitioner
